# Patient Record
Sex: Male | Born: 1940
Health system: Southern US, Community
[De-identification: ages and names within clinical notes are randomized; demographics above are authoritative.]

## PROBLEM LIST (undated history)

## (undated) DIAGNOSIS — Z9289 Personal history of other medical treatment: Secondary | ICD-10-CM

## (undated) DIAGNOSIS — Z8619 Personal history of other infectious and parasitic diseases: Secondary | ICD-10-CM

## (undated) DIAGNOSIS — I4819 Other persistent atrial fibrillation: Secondary | ICD-10-CM

## (undated) DIAGNOSIS — K259 Gastric ulcer, unspecified as acute or chronic, without hemorrhage or perforation: Secondary | ICD-10-CM

## (undated) DIAGNOSIS — K269 Duodenal ulcer, unspecified as acute or chronic, without hemorrhage or perforation: Secondary | ICD-10-CM

## (undated) DIAGNOSIS — E785 Hyperlipidemia, unspecified: Secondary | ICD-10-CM

## (undated) DIAGNOSIS — Z8719 Personal history of other diseases of the digestive system: Secondary | ICD-10-CM

## (undated) DIAGNOSIS — J449 Chronic obstructive pulmonary disease, unspecified: Secondary | ICD-10-CM

## (undated) DIAGNOSIS — E559 Vitamin D deficiency, unspecified: Secondary | ICD-10-CM

## (undated) DIAGNOSIS — K635 Polyp of colon: Secondary | ICD-10-CM

## (undated) DIAGNOSIS — F17200 Nicotine dependence, unspecified, uncomplicated: Secondary | ICD-10-CM

## (undated) DIAGNOSIS — I739 Peripheral vascular disease, unspecified: Secondary | ICD-10-CM

## (undated) DIAGNOSIS — I10 Essential (primary) hypertension: Secondary | ICD-10-CM

## (undated) DIAGNOSIS — R972 Elevated prostate specific antigen [PSA]: Secondary | ICD-10-CM

## (undated) HISTORY — DX: Peripheral vascular disease, unspecified: I73.9

## (undated) HISTORY — PX: CATARACT EXTRACTION: SUR2

## (undated) HISTORY — DX: Personal history of other infectious and parasitic diseases: Z86.19

## (undated) HISTORY — DX: Essential (primary) hypertension: I10

## (undated) HISTORY — DX: Chronic obstructive pulmonary disease, unspecified: J44.9

## (undated) HISTORY — DX: Polyp of colon: K63.5

## (undated) HISTORY — DX: Elevated prostate specific antigen (PSA): R97.20

## (undated) HISTORY — DX: Vitamin D deficiency, unspecified: E55.9

## (undated) HISTORY — DX: Duodenal ulcer, unspecified as acute or chronic, without hemorrhage or perforation: K26.9

## (undated) HISTORY — DX: Other persistent atrial fibrillation: I48.19

## (undated) HISTORY — DX: Gastric ulcer, unspecified as acute or chronic, without hemorrhage or perforation: K25.9

## (undated) HISTORY — DX: Hyperlipidemia, unspecified: E78.5

## (undated) HISTORY — DX: Nicotine dependence, unspecified, uncomplicated: F17.200

## (undated) HISTORY — DX: Personal history of other medical treatment: Z92.89

---

## 1998-06-13 ENCOUNTER — Encounter: Admission: RE | Admit: 1998-06-13 | Discharge: 1998-06-13 | Payer: Self-pay | Admitting: *Deleted

## 2003-05-01 ENCOUNTER — Other Ambulatory Visit: Admission: RE | Admit: 2003-05-01 | Discharge: 2003-05-01 | Payer: Self-pay | Admitting: Family Medicine

## 2005-05-13 ENCOUNTER — Encounter: Admission: RE | Admit: 2005-05-13 | Discharge: 2005-05-13 | Payer: Self-pay | Admitting: Cardiovascular Disease

## 2005-05-18 ENCOUNTER — Ambulatory Visit (HOSPITAL_COMMUNITY): Admission: RE | Admit: 2005-05-18 | Discharge: 2005-05-19 | Payer: Self-pay | Admitting: Cardiovascular Disease

## 2005-05-19 HISTORY — PX: ILIAC VEIN ANGIOPLASTY / STENTING: SHX1788

## 2005-06-10 HISTORY — PX: TRANSTHORACIC ECHOCARDIOGRAM: SHX275

## 2007-02-28 ENCOUNTER — Encounter: Admission: RE | Admit: 2007-02-28 | Discharge: 2007-02-28 | Payer: Self-pay | Admitting: Occupational Medicine

## 2008-02-29 LAB — HM DEXA SCAN

## 2008-05-06 DIAGNOSIS — K259 Gastric ulcer, unspecified as acute or chronic, without hemorrhage or perforation: Secondary | ICD-10-CM

## 2008-05-06 DIAGNOSIS — K635 Polyp of colon: Secondary | ICD-10-CM

## 2008-05-06 DIAGNOSIS — K269 Duodenal ulcer, unspecified as acute or chronic, without hemorrhage or perforation: Secondary | ICD-10-CM

## 2008-05-06 DIAGNOSIS — Z8619 Personal history of other infectious and parasitic diseases: Secondary | ICD-10-CM

## 2008-05-06 HISTORY — DX: Gastric ulcer, unspecified as acute or chronic, without hemorrhage or perforation: K25.9

## 2008-05-06 HISTORY — DX: Personal history of other infectious and parasitic diseases: Z86.19

## 2008-05-06 HISTORY — DX: Polyp of colon: K63.5

## 2008-05-06 HISTORY — DX: Duodenal ulcer, unspecified as acute or chronic, without hemorrhage or perforation: K26.9

## 2008-05-23 LAB — HM COLONOSCOPY

## 2010-02-19 DIAGNOSIS — Z9289 Personal history of other medical treatment: Secondary | ICD-10-CM

## 2010-02-19 HISTORY — DX: Personal history of other medical treatment: Z92.89

## 2010-11-21 NOTE — Cardiovascular Report (Signed)
Antonio Wise, Antonio Wise                ACCOUNT NO.:  [id number]   MEDICAL RECORD NO.:  [id number]          PATIENT TYPE:  OIB   LOCATION:  6527                         FACILITY:  [facility]   PHYSICIAN:  Nanetta Batty, M.D.   DATE OF BIRTH:  1940/07/31   DATE OF PROCEDURE:  05/19/2005  DATE OF DISCHARGE:                              CARDIAC CATHETERIZATION   PROCEDURE PERFORMED:  Abdominal aortogram with bifemoral runoff, PT and  stent report.   HISTORY OF PRESENT ILLNESS:  Mr. Egolf is a 70 year old married white male,  father of three, referred by Dr. Christell Constant for evaluation of claudication.  His  risk factors include discontinued tobacco two years ago, hypertension,  hyperlipidemia and family history.  He had a Cardiolite stress test  performed 03/11/05 which was nonischemic.  Doppler revealed normal ABIs with  high frequency signal in the left common iliac artery.  He presents now for  angiography potential intervention.   DESCRIPTION OF PROCEDURE:  The patient was brought to the Sixth [facility] Peripheral [facility] Angiographic suite in the postabsorptive state.  He  was premedicated with p.o. Valium.  Both groins were prepped and shaved in  the usual sterile fashion.  One percent Xylocaine was used for local  anesthesia.  A 5-French sheath was inserted into the right femoral artery  using sterile Seldinger technique.  A 5-French tennis racket catheter was  used for __________ of the distal abdominal aortography with bifemoral  runoff.  Visipaque dye used for the entirety of the case.  Retrograde aortic  pressures monitored during the case.   ANGIOGRAPHIC RESULTS:  1.  Abdominal aorta:  1a. Renal arteries - normal.  1b. Infrarenal abdominal aorta - normal.  1.  Left lower extremity:  2a.  An 80% eccentric ulcerated-appearing left common iliac artery stenosis.  2b. Normal SFA and infrapopliteal vasculature with three vessel runoff.  1.  Right lower extremity:  Normal.   DESCRIPTION OF PROCEDURE:  Access was obtained in the left femoral artery  using standard Seldinger technique and a long 6-French sheath.  The patient  received 2500 units of heparin intravenously.  Put a Wholey wire across the  lesion and it was stented primarily with a 9.4 SMART.  This, however, missed  the ostium and thus an overlapping 8 x 18 Genesis __________ premount was  then deployed at the ostium of the left common iliac artery.  The entire  stented segment was postdilated with a 9.4 Powerflex at 6 atmospheres  resulting in reduction of an 80% eccentric segmental proximal left common  iliac artery stenosis to 0% residual.  The patient tolerated the procedure  well.  ACT was measured.  Both sheaths were removed.  Pressure was held on  the groin to achieve hemostasis.  The patient left the lab in stable  condition.  He will be hydrated overnight, discharged home in the morning,  treated with  aspirin and Plavix.  He will get followup lower extremity Dopplers and ABIs,  and we will see him back in the office in approximately two weeks for  followup.  Dr. Roe Coombs  Christell Constant at [facility]'s office  was notified of these results.      Nanetta Batty, M.D.  Electronically Signed     JB/MEDQ  D:  05/18/2005  T:  05/19/2005  Job:  [id number]   cc:   Sixth Floor  Peripheral Vascular Angiographic Suite   [facility]  [address]  [place], [place] [address]   Ernestina Penna, M.D.  Fax: [phone]

## 2011-10-14 DIAGNOSIS — E559 Vitamin D deficiency, unspecified: Secondary | ICD-10-CM | POA: Diagnosis not present

## 2011-10-14 DIAGNOSIS — I10 Essential (primary) hypertension: Secondary | ICD-10-CM | POA: Diagnosis not present

## 2011-10-14 DIAGNOSIS — Z79899 Other long term (current) drug therapy: Secondary | ICD-10-CM | POA: Diagnosis not present

## 2011-10-14 DIAGNOSIS — E785 Hyperlipidemia, unspecified: Secondary | ICD-10-CM | POA: Diagnosis not present

## 2012-03-04 DIAGNOSIS — L259 Unspecified contact dermatitis, unspecified cause: Secondary | ICD-10-CM | POA: Diagnosis not present

## 2012-03-04 DIAGNOSIS — R609 Edema, unspecified: Secondary | ICD-10-CM | POA: Diagnosis not present

## 2012-03-15 DIAGNOSIS — I70219 Atherosclerosis of native arteries of extremities with intermittent claudication, unspecified extremity: Secondary | ICD-10-CM | POA: Diagnosis not present

## 2012-03-15 DIAGNOSIS — I739 Peripheral vascular disease, unspecified: Secondary | ICD-10-CM | POA: Diagnosis not present

## 2012-03-21 DIAGNOSIS — I739 Peripheral vascular disease, unspecified: Secondary | ICD-10-CM | POA: Diagnosis not present

## 2012-03-21 DIAGNOSIS — I10 Essential (primary) hypertension: Secondary | ICD-10-CM | POA: Diagnosis not present

## 2012-03-21 DIAGNOSIS — I4891 Unspecified atrial fibrillation: Secondary | ICD-10-CM | POA: Diagnosis not present

## 2012-03-21 DIAGNOSIS — I70219 Atherosclerosis of native arteries of extremities with intermittent claudication, unspecified extremity: Secondary | ICD-10-CM | POA: Diagnosis not present

## 2012-04-04 DIAGNOSIS — N401 Enlarged prostate with lower urinary tract symptoms: Secondary | ICD-10-CM | POA: Diagnosis not present

## 2012-04-04 DIAGNOSIS — R972 Elevated prostate specific antigen [PSA]: Secondary | ICD-10-CM | POA: Diagnosis not present

## 2012-04-06 DIAGNOSIS — I10 Essential (primary) hypertension: Secondary | ICD-10-CM | POA: Diagnosis not present

## 2012-04-11 DIAGNOSIS — I10 Essential (primary) hypertension: Secondary | ICD-10-CM | POA: Diagnosis not present

## 2012-04-11 DIAGNOSIS — E559 Vitamin D deficiency, unspecified: Secondary | ICD-10-CM | POA: Diagnosis not present

## 2012-04-11 DIAGNOSIS — Z23 Encounter for immunization: Secondary | ICD-10-CM | POA: Diagnosis not present

## 2012-04-11 DIAGNOSIS — E785 Hyperlipidemia, unspecified: Secondary | ICD-10-CM | POA: Diagnosis not present

## 2012-05-04 DIAGNOSIS — H25099 Other age-related incipient cataract, unspecified eye: Secondary | ICD-10-CM | POA: Diagnosis not present

## 2012-11-24 ENCOUNTER — Telehealth: Payer: Self-pay | Admitting: Physician Assistant

## 2012-11-24 MED ORDER — HYDROCHLOROTHIAZIDE 25 MG PO TABS: 25.0000 mg | ORAL_TABLET | Freq: Every day | ORAL | Status: DC

## 2012-11-24 MED ORDER — BENAZEPRIL HCL 40 MG PO TABS: 40.0000 mg | ORAL_TABLET | Freq: Every day | ORAL | Status: DC

## 2012-11-24 NOTE — Telephone Encounter (Signed)
Med rf °

## 2012-11-24 NOTE — Telephone Encounter (Signed)
Med rf, pt aware needs to make appt before anymore meds are refilled

## 2012-12-02 ENCOUNTER — Telehealth: Payer: Self-pay | Admitting: Physician Assistant

## 2012-12-02 MED ORDER — ATORVASTATIN CALCIUM 80 MG PO TABS: 80.0000 mg | ORAL_TABLET | Freq: Every day | ORAL | Status: DC

## 2012-12-02 MED ORDER — OLMESARTAN MEDOXOMIL 20 MG PO TABS: 20.0000 mg | ORAL_TABLET | Freq: Every day | ORAL | Status: DC

## 2012-12-02 NOTE — Telephone Encounter (Signed)
Medication refilled per protocol. 

## 2012-12-06 ENCOUNTER — Telehealth: Payer: Self-pay | Admitting: Physician Assistant

## 2012-12-06 NOTE — Telephone Encounter (Signed)
Refills will be extended when keeps OV tomorrow.

## 2012-12-07 ENCOUNTER — Encounter: Payer: Self-pay | Admitting: Physician Assistant

## 2012-12-07 ENCOUNTER — Ambulatory Visit (INDEPENDENT_AMBULATORY_CARE_PROVIDER_SITE_OTHER): Payer: Medicare Other | Admitting: Physician Assistant

## 2012-12-07 VITALS — BP 142/80 | HR 68 | Temp 97.0°F | Resp 18 | Ht 71.5 in | Wt 203.0 lb

## 2012-12-07 DIAGNOSIS — I10 Essential (primary) hypertension: Secondary | ICD-10-CM | POA: Diagnosis not present

## 2012-12-07 DIAGNOSIS — R972 Elevated prostate specific antigen [PSA]: Secondary | ICD-10-CM

## 2012-12-07 DIAGNOSIS — E559 Vitamin D deficiency, unspecified: Secondary | ICD-10-CM

## 2012-12-07 DIAGNOSIS — I4891 Unspecified atrial fibrillation: Secondary | ICD-10-CM

## 2012-12-07 DIAGNOSIS — F17200 Nicotine dependence, unspecified, uncomplicated: Secondary | ICD-10-CM

## 2012-12-07 DIAGNOSIS — E785 Hyperlipidemia, unspecified: Secondary | ICD-10-CM

## 2012-12-07 DIAGNOSIS — I739 Peripheral vascular disease, unspecified: Secondary | ICD-10-CM | POA: Insufficient documentation

## 2012-12-07 DIAGNOSIS — J441 Chronic obstructive pulmonary disease with (acute) exacerbation: Secondary | ICD-10-CM | POA: Insufficient documentation

## 2012-12-07 DIAGNOSIS — I480 Paroxysmal atrial fibrillation: Secondary | ICD-10-CM | POA: Insufficient documentation

## 2012-12-07 LAB — COMPLETE METABOLIC PANEL WITH GFR
ALT: 13 U/L (ref 0–53)
AST: 13 U/L (ref 0–37)
Albumin: 4.5 g/dL (ref 3.5–5.2)
Alkaline Phosphatase: 57 U/L (ref 39–117)
BUN: 19 mg/dL (ref 6–23)
CO2: 28 mEq/L (ref 19–32)
Calcium: 9.2 mg/dL (ref 8.4–10.5)
Chloride: 103 mEq/L (ref 96–112)
Creat: 1.37 mg/dL — ABNORMAL HIGH (ref 0.50–1.35)
GFR, Est African American: 60 mL/min
GFR, Est Non African American: 52 mL/min — ABNORMAL LOW
Glucose, Bld: 110 mg/dL — ABNORMAL HIGH (ref 70–99)
Potassium: 4.8 mEq/L (ref 3.5–5.3)
Sodium: 144 mEq/L (ref 135–145)
Total Bilirubin: 0.7 mg/dL (ref 0.3–1.2)
Total Protein: 6.5 g/dL (ref 6.0–8.3)

## 2012-12-07 LAB — LIPID PANEL
Cholesterol: 137 mg/dL (ref 0–200)
HDL: 34 mg/dL — ABNORMAL LOW (ref >39)
Triglycerides: 124 mg/dL (ref <150)
VLDL: 25 mg/dL (ref 0–40)

## 2012-12-07 MED ORDER — OLMESARTAN MEDOXOMIL 20 MG PO TABS: 20.0000 mg | ORAL_TABLET | Freq: Every day | ORAL | Status: DC

## 2012-12-07 MED ORDER — HYDROCHLOROTHIAZIDE 25 MG PO TABS: 25.0000 mg | ORAL_TABLET | Freq: Every day | ORAL | Status: DC

## 2012-12-07 MED ORDER — CLONIDINE HCL 0.1 MG PO TABS: 0.1000 mg | ORAL_TABLET | Freq: Two times a day (BID) | ORAL | Status: DC

## 2012-12-07 MED ORDER — PROPAFENONE HCL 225 MG PO TABS: 225.0000 mg | ORAL_TABLET | Freq: Two times a day (BID) | ORAL | Status: DC

## 2012-12-07 MED ORDER — BENAZEPRIL HCL 40 MG PO TABS: 40.0000 mg | ORAL_TABLET | Freq: Every day | ORAL | Status: DC

## 2012-12-07 MED ORDER — ATORVASTATIN CALCIUM 80 MG PO TABS: 80.0000 mg | ORAL_TABLET | Freq: Every day | ORAL | Status: DC

## 2012-12-07 NOTE — Progress Notes (Signed)
Patient ID: Antonio Wise MRN: [id number], DOB: Dec 01, 1940, 72 y.o. Date of Encounter: @DATE @  Chief Complaint:  Chief Complaint  Patient presents with  . 6 mth check up    is fasting    HPI: 72 y.o. year old male  presents for routine f/u OV.   1-HTN: Taking meds as listed. No adv effects. 2-HLD: Taking Lipitor 80mg . No myalgias or other adv effects.  3-Vit D Def: Is taking 1200 IU as directed. 4-Smoker: Says he still has not filled Rx for Chantix. Still smoking about 1 ppd.  He has no complaints. Says he has been feeling good. No chest pressure, heaviness, tightness, SOB-even with exertion.  No LE Claudicaton symtoms. No paitations. LOV with Dr. Allyson Sabal 03/2012.   Past Medical History  Diagnosis Date  . Hypertension   . Hyperlipidemia   . Vitamin D deficiency   . Smoker unmotivated to quit   . Peripheral vascular disease   . Paroxysmal atrial fibrillation   . Elevated PSA      Home Meds: See attached medication section for current medication list. Any medications entered into computer today will not appear on this note's list. The medications listed below were entered prior to today. Current Outpatient Prescriptions on File Prior to Visit  Medication Sig Dispense Refill  . aspirin 81 MG tablet Take 81 mg by mouth daily.       No current facility-administered medications on file prior to visit.    Allergies:  Allergies  Allergen Reactions  . Codeine     History   Social History  . Marital Status: Married    Spouse Name: N/A    Number of Children: N/A  . Years of Education: N/A   Occupational History  . Not on file.   Social History Main Topics  . Smoking status: Current Every Day Smoker -- 0.50 packs/day    Types: Cigarettes  . Smokeless tobacco: Never Used  . Alcohol Use: No  . Drug Use: No  . Sexually Active: Not on file   Other Topics Concern  . Not on file   Social History Narrative  . No narrative on file    No family history on file.    Review of Systems:  See HPI for pertinent ROS. All other ROS negative.    Physical Exam: Blood pressure 142/80, pulse 68, temperature 97 F (36.1 C), temperature source Oral, resp. rate 18, height 5' 11.5" (1.816 m), weight 203 lb (92.08 kg)., Body mass index is 27.92 kg/(m^2). BP By Me: 130/70  General:WNWD WM.  Appears in no acute distress. Neck: Supple. No thyromegaly. No lymphadenopathy. No Carotid Bruits. Lungs: Clear bilaterally to auscultation without wheezes, rales, or rhonchi. Breathing is unlabored. Heart: RRR with S1 S2. No murmurs, rubs, or gallops. Abdomen: Soft, non-tender, non-distended with normoactive bowel sounds. No hepatomegaly. No rebound/guarding. No obvious abdominal masses. Musculoskeletal:  Strength and tone normal for age. Extremities/Skin: Warm and dry. No clubbing or cyanosis. No edema. No rashes or suspicious lesions. Neuro: Alert and oriented X 3. Moves all extremities spontaneously. Gait is normal. CNII-XII grossly in tact. Psych:  Responds to questions appropriately with a normal affect.     ASSESSMENT AND PLAN:  72 y.o. year old male with  1. Hyperlipidemia - COMPLETE METABOLIC PANEL WITH GFR - Lipid panel - atorvastatin (LIPITOR) 80 MG tablet; Take 1 tablet (80 mg total) by mouth daily.  Dispense: 90 tablet; Refill: 1  2. Hypertension I got BP 130/70, which is at goal.  Cont current meds. Check lab to monitor.  - COMPLETE METABOLIC PANEL WITH GFR - benazepril (LOTENSIN) 40 MG tablet; Take 1 tablet (40 mg total) by mouth daily.  Dispense: 90 tablet; Refill: 1 - cloNIDine (CATAPRES) 0.1 MG tablet; Take 1 tablet (0.1 mg total) by mouth 2 (two) times daily.  Dispense: 180 tablet; Refill: 1 - hydrochlorothiazide (HYDRODIURIL) 25 MG tablet; Take 1 tablet (25 mg total) by mouth daily.  Dispense: 90 tablet; Refill: 1 - olmesartan (BENICAR) 20 MG tablet; Take 1 tablet (20 mg total) by mouth daily.  Dispense: 90 tablet; Refill: 1  3. Smoker unmotivated to  quit See HPI. I gave Rx Chantix at prior OV. He never filled Rx. Has cut back some on his own. Jus tisnt motivated to quit.   4. Elevated PSA H/o negative biopsy. Sees Urology annually.   5. Peripheral vascular disease Per Dr. Allyson Sabal  6. Paroxysmal atrial fibrillation Per Dr. Allyson Sabal - propafenone (RYTHMOL) 225 MG tablet; Take 1 tablet (225 mg total) by mouth 2 (two) times daily.  Dispense: 180 tablet; Refill: 1  7. Vitamin D deficiency On 1200IU QD - Vitamin D 25 hydroxy  8. H/o Negative Cardiolite 9. DEXA 02/2008-Nml *10. Colonoscopy 11/09-Polyp-Repeat 5 years--DUE 05/2013 * 11. Pneumonia Vaccine: 04/2011 12. Tetanus Vaccine: 04/2003: DUE 04/2013*  Signed, [address] [place], [place], [facility] 12/07/2012 2:14 PM

## 2012-12-08 LAB — VITAMIN D 25 HYDROXY (VIT D DEFICIENCY, FRACTURES): Vit D, 25-Hydroxy: 46 ng/mL (ref 30–89)

## 2013-03-22 ENCOUNTER — Encounter: Payer: Self-pay | Admitting: *Deleted

## 2013-03-28 ENCOUNTER — Encounter: Payer: Self-pay | Admitting: Cardiovascular Disease

## 2013-03-29 ENCOUNTER — Ambulatory Visit (INDEPENDENT_AMBULATORY_CARE_PROVIDER_SITE_OTHER): Payer: Medicare Other | Admitting: Cardiovascular Disease

## 2013-03-29 ENCOUNTER — Encounter: Payer: Self-pay | Admitting: Cardiovascular Disease

## 2013-03-29 VITALS — BP 160/80 | HR 67 | Ht 73.0 in | Wt 207.0 lb

## 2013-03-29 DIAGNOSIS — E785 Hyperlipidemia, unspecified: Secondary | ICD-10-CM

## 2013-03-29 DIAGNOSIS — F17200 Nicotine dependence, unspecified, uncomplicated: Secondary | ICD-10-CM

## 2013-03-29 DIAGNOSIS — I4891 Unspecified atrial fibrillation: Secondary | ICD-10-CM

## 2013-03-29 DIAGNOSIS — I739 Peripheral vascular disease, unspecified: Secondary | ICD-10-CM

## 2013-03-29 DIAGNOSIS — I480 Paroxysmal atrial fibrillation: Secondary | ICD-10-CM

## 2013-03-29 DIAGNOSIS — I10 Essential (primary) hypertension: Secondary | ICD-10-CM

## 2013-03-29 NOTE — Progress Notes (Signed)
03/29/2013 Antonio Wise   03/19/1941  [id number]  Primary Physician Frazier Richards, PA-C Primary Cardiologist: Runell Gess MD Roseanne Reno   HPI:  The patient returns today for followup. I last saw him a year ago. He is a 72 year old, mildly overweight, married Caucasian male, father of three, grandfather to three grandchildren with a history of peripheral vascular occlusive disease status post left common iliac artery PTA and stenting by myself August 19, 2004, for claudication. His symptoms resolved after that, and his Dopplers normalized. His other problems include discontinued tobacco abuse, only smoking two cigarettes a week, which is excellent for him. He has treated hypertension, dyslipidemia, and paroxysmal atrial fibrillation maintaining sinus rhythm on Rythmol. He has not had any A fib to his knowledge since I last saw him.   His most recent lipid profile performed 12/07/12 revealed a total cholesterol 137, LDL of 78 and HDL of 34. His most recent blood sugar Dopplers performed a year ago revealed ABIs of 1 bilaterally with patent left iliac stent. He denies chest pain, shortness of breath or claudication.  Current Outpatient Prescriptions  Medication Sig Dispense Refill  . aspirin 81 MG tablet Take 81 mg by mouth daily.      Marland Kitchen atorvastatin (LIPITOR) 80 MG tablet Take 1 tablet (80 mg total) by mouth daily.  90 tablet  1  . benazepril (LOTENSIN) 40 MG tablet Take 1 tablet (40 mg total) by mouth daily.  90 tablet  1  . cloNIDine (CATAPRES) 0.1 MG tablet Take 1 tablet (0.1 mg total) by mouth 2 (two) times daily.  180 tablet  1  . hydrochlorothiazide (HYDRODIURIL) 25 MG tablet Take 1 tablet (25 mg total) by mouth daily.  90 tablet  1  . Multiple Minerals-Vitamins (CALCIUM & VIT D3 BONE HEALTH PO) Take 1 tablet by mouth daily.      Marland Kitchen olmesartan (BENICAR) 20 MG tablet Take 1 tablet (20 mg total) by mouth daily.  90 tablet  1  . propafenone (RYTHMOL) 225 MG tablet Take 1  tablet (225 mg total) by mouth 2 (two) times daily.  180 tablet  1   No current facility-administered medications for this visit.    Allergies  Allergen Reactions  . Codeine     History   Social History  . Marital Status: Married    Spouse Name: N/A    Number of Children: 3  . Years of Education: N/A   Occupational History  .  [employer]   Social History Main Topics  . Smoking status: Current Every Day Smoker -- 0.50 packs/day    Types: Cigarettes  . Smokeless tobacco: Never Used  . Alcohol Use: No  . Drug Use: No  . Sexual Activity: Not on file   Other Topics Concern  . Not on file   Social History Narrative  . No narrative on file     Review of Systems: General: negative for chills, fever, night sweats or weight changes.  Cardiovascular: negative for chest pain, dyspnea on exertion, edema, orthopnea, palpitations, paroxysmal nocturnal dyspnea or shortness of breath Dermatological: negative for rash Respiratory: negative for cough or wheezing Urologic: negative for hematuria Abdominal: negative for nausea, vomiting, diarrhea, bright red blood per rectum, melena, or hematemesis Neurologic: negative for visual changes, syncope, or dizziness All other systems reviewed and are otherwise negative except as noted above.    Blood pressure 160/80, pulse 67, height 6\' 1"  (1.854 m), weight 207 lb (93.895 kg).  General appearance: alert and  no distress Neck: no adenopathy, no carotid bruit, no JVD, supple, symmetrical, trachea midline and thyroid not enlarged, symmetric, no tenderness/mass/nodules Lungs: clear to auscultation bilaterally Heart: regular rate and rhythm, S1, S2 normal, no murmur, click, rub or gallop Extremities: extremities normal, atraumatic, no cyanosis or edema  EKG normal sinus rhythm at 68 with septal Q waves  ASSESSMENT AND PLAN:   Hypertension Poorly controlled on current medications but he says his blood pressure is better at  home  Hyperlipidemia On statin therapy. Recent lipid profile performed 12/07/12 revealed a total cholesterol of 137, LDL 78 HDL of 34  Smoker unmotivated to quit Uninterested in stopping  Peripheral vascular disease Status post restenting of his left common iliac artery 11/14/6 with recent Dopplers performed 03/15/12 revealing has been to be widely patent. He denies claudication. We will recheck this on an annual basis.      Runell Gess MD FACP,FACC,FAHA, [facility] 03/29/2013 11:04 AM

## 2013-03-29 NOTE — Assessment & Plan Note (Signed)
On statin therapy. Recent lipid profile performed 12/07/12 revealed a total cholesterol of 137, LDL 78 HDL of 34

## 2013-03-29 NOTE — Assessment & Plan Note (Signed)
Status post restenting of his left common iliac artery 11/14/6 with recent Dopplers performed 03/15/12 revealing has been to be widely patent. He denies claudication. We will recheck this on an annual basis.

## 2013-03-29 NOTE — Assessment & Plan Note (Signed)
Uninterested in stopping

## 2013-03-29 NOTE — Assessment & Plan Note (Signed)
Poorly controlled on current medications but he says his blood pressure is better at home

## 2013-03-29 NOTE — Patient Instructions (Addendum)
Your physician wants you to follow-up in: 1 year with Dr Allyson Sabal.  You will receive a reminder letter in the mail two months in advance. If you don't receive a letter, please call our office to schedule the follow-up appointment.  Dr Allyson Sabal has ordered lower extremity arterial dopplers

## 2013-04-06 ENCOUNTER — Ambulatory Visit (INDEPENDENT_AMBULATORY_CARE_PROVIDER_SITE_OTHER): Payer: Medicare Other | Admitting: Physician Assistant

## 2013-04-06 ENCOUNTER — Encounter: Payer: Self-pay | Admitting: Physician Assistant

## 2013-04-06 VITALS — BP 160/90 | HR 80 | Temp 98.3°F | Resp 20 | Wt 203.0 lb

## 2013-04-06 DIAGNOSIS — Z23 Encounter for immunization: Secondary | ICD-10-CM

## 2013-04-06 DIAGNOSIS — J449 Chronic obstructive pulmonary disease, unspecified: Secondary | ICD-10-CM | POA: Diagnosis not present

## 2013-04-06 DIAGNOSIS — F17200 Nicotine dependence, unspecified, uncomplicated: Secondary | ICD-10-CM

## 2013-04-06 MED ORDER — VARENICLINE TARTRATE 0.5 MG PO TABS: 0.5000 mg | ORAL_TABLET | Freq: Two times a day (BID) | ORAL | Status: DC

## 2013-04-06 MED ORDER — BUDESONIDE-FORMOTEROL FUMARATE 160-4.5 MCG/ACT IN AERO: 2.0000 | INHALATION_SPRAY | Freq: Two times a day (BID) | RESPIRATORY_TRACT | Status: DC

## 2013-04-06 MED ORDER — VARENICLINE TARTRATE 1 MG PO TABS: 1.0000 mg | ORAL_TABLET | Freq: Two times a day (BID) | ORAL | Status: DC

## 2013-04-07 NOTE — Progress Notes (Signed)
Patient ID: DAVIED NOCITO MRN: [id number], DOB: [birth year], 72 y.o. Date of Encounter: 04/07/2013, 10:28 AM    Chief Complaint:  Chief Complaint  Patient presents with  . 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 Was told to f/u with PCP for treatment. No other complaints.     Home Meds: See attached medication section for any medications that were entered at today's visit. The computer does not put those onto this list.The following list is a list of meds entered prior to today's visit.   Current Outpatient Prescriptions on File Prior to Visit  Medication Sig Dispense Refill  . aspirin 81 MG tablet Take 81 mg by mouth daily.      Marland Kitchen atorvastatin (LIPITOR) 80 MG tablet Take 1 tablet (80 mg total) by mouth daily.  90 tablet  1  . benazepril (LOTENSIN) 40 MG tablet Take 1 tablet (40 mg total) by mouth daily.  90 tablet  1  . cloNIDine (CATAPRES) 0.1 MG tablet Take 1 tablet (0.1 mg total) by mouth 2 (two) times daily.  180 tablet  1  . hydrochlorothiazide (HYDRODIURIL) 25 MG tablet Take 1 tablet (25 mg total) by mouth daily.  90 tablet  1  . Multiple Minerals-Vitamins (CALCIUM & VIT D3 BONE HEALTH PO) Take 1 tablet by mouth daily.      Marland Kitchen olmesartan (BENICAR) 20 MG tablet Take 1 tablet (20 mg total) by mouth daily.  90 tablet  1  . propafenone (RYTHMOL) 225 MG tablet Take 1 tablet (225 mg total) by mouth 2 (two) times daily.  180 tablet  1   No current facility-administered medications on file prior to visit.    Allergies:  Allergies  Allergen Reactions  . Codeine       Review of Systems: See HPI for pertinent ROS. All other ROS negative.    Physical Exam: Blood pressure 160/90, pulse 80, temperature 98.3 F (36.8 C), temperature source Oral, resp. rate 20, weight 203 lb (92.08 kg), SpO2 93.00%., Body mass index is 26.79 kg/(m^2). General:  WNWD WM.  Appears in no acute distress. Neck: Supple. No thyromegaly. No lymphadenopathy. Lungs: Distant, decreased breath sounds throughout but no wheezes. Heart: Regular rhythm. No murmurs, rubs, or gallops Msk:  Strength and tone normal for age. Extremities/Skin: Warm and dry.  Neuro: Alert and oriented X 3. Moves all extremities spontaneously. Gait is normal. CNII-XII grossly in tact. Psych:  Responds to questions appropriately with a normal affect.     ASSESSMENT AND PLAN:  72 y.o. year old male with  1. COPD (chronic obstructive pulmonary disease) - varenicline (CHANTIX) 0.5 MG tablet; Take 1 tablet (0.5 mg total) by mouth 2 (two) times daily.  Dispense: 60 tablet; Refill: 0 - varenicline (CHANTIX CONTINUING MONTH PAK) 1 MG tablet; Take 1 tablet (1 mg total) by mouth 2 (two) times daily.  Dispense: 60 tablet; Refill: 3 - budesonide-formoterol (SYMBICORT) 160-4.5 MCG/ACT inhaler; Inhale 2 puffs into the lungs 2 (two) times daily.  Dispense: 1 Inhaler; Refill: 3  2. Smoker - varenicline (CHANTIX) 0.5 MG tablet; Take 1 tablet (0.5 mg total) by mouth 2 (two) times daily.  Dispense: 60 tablet; Refill: 0 - varenicline (CHANTIX CONTINUING MONTH PAK) 1 MG tablet; Take 1 tablet (1 mg total) by mouth 2 (two) times daily.  Dispense: 60 tablet; Refill: 3 -  budesonide-formoterol (SYMBICORT) 160-4.5 MCG/ACT inhaler; Inhale 2 puffs into the lungs 2 (two) times daily.  Dispense: 1 Inhaler; Refill: 3    Pt reports that since this test, he has decided he is going to quit smoking. Has already bought some patches. However, says he used patches in past withoout success. Discussed Chantix and he is agreeabl to use this. Cationed regarding possible adverse effects. If develops cahnge in mood/behavior, stop med and call me immediately.  Will also start Symbicort.   3. Need for prophylactic vaccination and inoculation against influenza - Flu Vaccine QUAD 36+ mos PF IM (Fluarix)   Signed, [address]  [place], [place], [facility] 04/07/2013 10:28 AM

## 2013-04-12 ENCOUNTER — Telehealth (HOSPITAL_COMMUNITY): Payer: Self-pay | Admitting: *Deleted

## 2013-04-12 ENCOUNTER — Ambulatory Visit (HOSPITAL_COMMUNITY)
Admission: RE | Admit: 2013-04-12 | Discharge: 2013-04-12 | Disposition: A | Discharge status: Home / Self Care | Payer: Medicare Other | Source: Ambulatory Visit | Attending: Cardiovascular Disease | Admitting: Cardiovascular Disease

## 2013-04-12 DIAGNOSIS — I70219 Atherosclerosis of native arteries of extremities with intermittent claudication, unspecified extremity: Secondary | ICD-10-CM

## 2013-04-12 DIAGNOSIS — I739 Peripheral vascular disease, unspecified: Secondary | ICD-10-CM | POA: Diagnosis not present

## 2013-04-12 NOTE — Progress Notes (Signed)
Lower Extremity Arterial Duplex Completed. °Brianna L Mazza,RVT °

## 2013-04-13 DIAGNOSIS — N401 Enlarged prostate with lower urinary tract symptoms: Secondary | ICD-10-CM | POA: Diagnosis not present

## 2013-04-13 DIAGNOSIS — R972 Elevated prostate specific antigen [PSA]: Secondary | ICD-10-CM | POA: Diagnosis not present

## 2013-04-14 ENCOUNTER — Encounter: Payer: Self-pay | Admitting: *Deleted

## 2013-06-14 ENCOUNTER — Ambulatory Visit (INDEPENDENT_AMBULATORY_CARE_PROVIDER_SITE_OTHER): Payer: Medicare Other | Admitting: Physician Assistant

## 2013-06-14 ENCOUNTER — Encounter: Payer: Self-pay | Admitting: Physician Assistant

## 2013-06-14 VITALS — BP 112/68 | HR 72 | Temp 96.8°F | Resp 20 | Wt 209.0 lb

## 2013-06-14 DIAGNOSIS — R972 Elevated prostate specific antigen [PSA]: Secondary | ICD-10-CM

## 2013-06-14 DIAGNOSIS — F17200 Nicotine dependence, unspecified, uncomplicated: Secondary | ICD-10-CM

## 2013-06-14 DIAGNOSIS — R7309 Other abnormal glucose: Secondary | ICD-10-CM

## 2013-06-14 DIAGNOSIS — E559 Vitamin D deficiency, unspecified: Secondary | ICD-10-CM

## 2013-06-14 DIAGNOSIS — Z23 Encounter for immunization: Secondary | ICD-10-CM

## 2013-06-14 DIAGNOSIS — J449 Chronic obstructive pulmonary disease, unspecified: Secondary | ICD-10-CM

## 2013-06-14 DIAGNOSIS — I10 Essential (primary) hypertension: Secondary | ICD-10-CM | POA: Diagnosis not present

## 2013-06-14 DIAGNOSIS — E785 Hyperlipidemia, unspecified: Secondary | ICD-10-CM

## 2013-06-14 DIAGNOSIS — I739 Peripheral vascular disease, unspecified: Secondary | ICD-10-CM

## 2013-06-14 DIAGNOSIS — I480 Paroxysmal atrial fibrillation: Secondary | ICD-10-CM

## 2013-06-14 DIAGNOSIS — I4891 Unspecified atrial fibrillation: Secondary | ICD-10-CM

## 2013-06-14 DIAGNOSIS — R739 Hyperglycemia, unspecified: Secondary | ICD-10-CM | POA: Insufficient documentation

## 2013-06-14 DIAGNOSIS — Z1211 Encounter for screening for malignant neoplasm of colon: Secondary | ICD-10-CM | POA: Insufficient documentation

## 2013-06-14 LAB — LIPID PANEL
Cholesterol: 141 mg/dL (ref 0–200)
LDL Cholesterol: 81 mg/dL (ref 0–99)
VLDL: 24 mg/dL (ref 0–40)

## 2013-06-14 LAB — COMPLETE METABOLIC PANEL WITH GFR
AST: 12 U/L (ref 0–37)
Albumin: 4.4 g/dL (ref 3.5–5.2)
Alkaline Phosphatase: 64 U/L (ref 39–117)
BUN: 23 mg/dL (ref 6–23)
CO2: 24 mEq/L (ref 19–32)
Calcium: 9.3 mg/dL (ref 8.4–10.5)
Chloride: 102 mEq/L (ref 96–112)
Creat: 1.35 mg/dL (ref 0.50–1.35)
GFR, Est Non African American: 53 mL/min — ABNORMAL LOW
Glucose, Bld: 101 mg/dL — ABNORMAL HIGH (ref 70–99)
Potassium: 4.2 mEq/L (ref 3.5–5.3)
Sodium: 139 mEq/L (ref 135–145)
Total Bilirubin: 0.8 mg/dL (ref 0.3–1.2)
Total Protein: 6.6 g/dL (ref 6.0–8.3)

## 2013-06-14 LAB — HEMOGLOBIN A1C
Hgb A1c MFr Bld: 5.6 % (ref <5.7)
Mean Plasma Glucose: 114 mg/dL (ref <117)

## 2013-06-14 MED ORDER — AMLODIPINE BESYLATE 5 MG PO TABS: 5.0000 mg | ORAL_TABLET | Freq: Every day | ORAL | Status: DC

## 2013-06-14 NOTE — Progress Notes (Signed)
Patient ID: Antonio Wise MRN: [id number], DOB: 13-Jul-1940, 72 y.o. Date of Encounter: @DATE @  Chief Complaint:  Chief Complaint  Patient presents with  . 6 mth check up    is fasting    HPI: 72 y.o. year old white male  presents for routine followup.  His last visit with me was on 04/06/13 for evaluation of COPD. He had gone in for DOT physical and they had done PFTs which revealed severe COPD. He was told to followup with PCP for treatment. At that visit I prescribed Chantix and also prescribe Symbicort. At that visit he reported that since having that test he decided he was going to quit smoking. He already bought some patches. However he reported he used the patches in the past without success. He was agreeable to use the Chantix. Today he says that after the visit with me on 04/06/13 later that day he went back to the DOT physician. At that visit he told DOT examiner that he had been prescribed Chantix for smoking cessation. The examiner told him that he would not sign for him to drive under the DOT provisions while taking Chantix. Therefore the patient  said he would not use Chantix and instead we used patches in E. cigarettes. The patient today reports he is still using the patches and the E- cigarettes. As well he is taking the Symbicort as directed. He has smoked no regular cigarettes for over one month now. Says that he can tell symptomatic improvement with improved breathing.  Says that at that last visit with the DOT examiner he gave him a card to cover him driving for 3 months. At the end of this 3 months patient has to see him back for followup. Prior to that appointment he was to see me to have a followup piece of paper completed. He has a followup appointment with the DOT examiner O1/2/15.   Patient also reports that he is in a "doughnut hole "because of his Benicar. Is asking whether there is a cheaper option for this.  He is taking blood pressure medications as  directed. He is taking cholesterol medication as directed. No myalgias or other adverse effects.   Past Medical History  Diagnosis Date  . Hypertension   . Hyperlipidemia   . Vitamin D deficiency   . Smoker unmotivated to quit   . Peripheral vascular disease   . Paroxysmal atrial fibrillation   . Elevated PSA   . COPD (chronic obstructive pulmonary disease)   . H/O Helicobacter infection 05/06/2008    EGD  . Gastric ulcer 05/06/2008    EGD  . Duodenal ulcer 05/06/2008    EGD  . Colon polyp 05/06/2008    repeat 5 years  . History of nuclear stress test 02/19/2010    bruce protocol; mild-mod perfusion defect in spetal region consistent with attenuation artifact; no significant ischemia demonstrated; dysrhythmia during exercise     Home Meds: See attached medication section for current medication list. Any medications entered into computer today will not appear on this note's list. The medications listed below were entered prior to today. Current Outpatient Prescriptions on File Prior to Visit  Medication Sig Dispense Refill  . aspirin 81 MG tablet Take 81 mg by mouth daily.      . benazepril (LOTENSIN) 40 MG tablet Take 1 tablet (40 mg total) by mouth daily.  90 tablet  1  . budesonide-formoterol (SYMBICORT) 160-4.5 MCG/ACT inhaler Inhale 2 puffs into the lungs 2 (two)  times daily.  1 Inhaler  3  . cloNIDine (CATAPRES) 0.1 MG tablet Take 1 tablet (0.1 mg total) by mouth 2 (two) times daily.  180 tablet  1  . hydrochlorothiazide (HYDRODIURIL) 25 MG tablet Take 1 tablet (25 mg total) by mouth daily.  90 tablet  1  . Multiple Minerals-Vitamins (CALCIUM & VIT D3 BONE HEALTH PO) Take 1 tablet by mouth daily.      . propafenone (RYTHMOL) 225 MG tablet Take 1 tablet (225 mg total) by mouth 2 (two) times daily.  180 tablet  1   No current facility-administered medications on file prior to visit.    Allergies:  Allergies  Allergen Reactions  . Codeine     History   Social History  .  Marital Status: Married    Spouse Name: N/A    Number of Children: 3  . Years of Education: N/A   Occupational History  .  [employer]   Social History Main Topics  . Smoking status: Former Smoker -- 0.50 packs/day    Types: Cigarettes    Quit date: 05/17/2013  . Smokeless tobacco: Never Used  . Alcohol Use: No  . Drug Use: No  . Sexual Activity: Not on file   Other Topics Concern  . Not on file   Social History Narrative  . No narrative on file    Family History  Problem Relation Age of Onset  . Heart disease Mother   . COPD Father   . Cancer Sister 68    Breast Cancer  . Diabetes Brother   . Cancer Father      Review of Systems:  See HPI for pertinent ROS. All other ROS negative.    Physical Exam: Blood pressure 112/68, pulse 72, temperature 96.8 F (36 C), temperature source Oral, resp. rate 20, weight 209 lb (94.802 kg)., Body mass index is 27.58 kg/(m^2). General: WNWD WM. Appears in no acute distress. Neck: Supple. No thyromegaly. No lymphadenopathy. No carotid bruits. Lungs: Breath sounds are decreased and distant. However there are no active wheezes. Heart: RRR with S1 S2. No murmurs, rubs, or gallops. Abdomen: Soft, non-tender, non-distended with normoactive bowel sounds. No hepatomegaly. No rebound/guarding. No obvious abdominal masses. Musculoskeletal:  Strength and tone normal for age. Extremities/Skin: Warm and dry. No clubbing or cyanosis. No edema. No rashes or suspicious lesions. Neuro: Alert and oriented X 3. Moves all extremities spontaneously. Gait is normal. CNII-XII grossly in tact. Psych:  Responds to questions appropriately with a normal affect.     ASSESSMENT AND PLAN:  72 y.o. year old male with  1. Hypertension In the past he was on Cardizem but he is not currently on any calcium channel blocker. Therefore we can stop the Benicar and replace this with Norvasc 5 mg. Make this change secondary to finances. See history of present  illness. cont all other blood pressure medications the same. - COMPLETE METABOLIC PANEL WITH GFR - amLODipine (NORVASC) 5 MG tablet; Take 1 tablet (5 mg total) by mouth daily.  Dispense: 90 tablet; Refill: 3  2. Hyperlipidemia - COMPLETE METABOLIC PANEL WITH GFR - Lipid panel  3. Vitamin D deficiency  - Vit D  25 hydroxy (rtn osteoporosis monitoring)  4. Smoker -- now on patches in E. cigarettes. Not smoking any regular cigarettes now.  5. Peripheral vascular disease Per Dr. Allyson Sabal  6. Paroxysmal atrial fibrillation Per dr berry  7. Elevated PSA History of negative biopsy. Sees urology annually.  8. COPD (chronic obstructive pulmonary  disease) See history of present illness. Continue Symbicort. Stay off of cigarettes. Wean off of the patches in E. cigarettes as able. Followup with the DOT examiner regarding the DOT guidelines.  9. Screening for colorectal cancer Last colonoscopy was November 2009. It showed polyp. Repeat 5 years. This was due November 2014. Asian says that he has not gone for followup. I wrote down Dr. Cecille Rubin phone number for him to call. He is to call to schedule followup.  10. History of negative Cardiolite.  11. DEXA scan August 2009 normal  12. Immunizations: With the vaccine was given here 04/07/13 Pneumovax was given here October 2012. Given the new guidelines we'll add Prevnar 13 now. Tetanus vaccine: 04/2003. This was due October 2014. However we are out of tetanus vaccine today. We will need to update his tetanus vaccine at his next office visit here.  13. Hyperglycemia:  I just realized on his last lab his glucose was slightly elevated and I documented that I would check an A1c at his next checked. We'll A1cNow.   Signed, [address] [place], [place], [facility] 06/14/2013 10:05 AM

## 2013-06-15 LAB — VITAMIN D 25 HYDROXY (VIT D DEFICIENCY, FRACTURES): Vit D, 25-Hydroxy: 35 ng/mL (ref 30–89)

## 2013-06-29 ENCOUNTER — Other Ambulatory Visit: Payer: Self-pay | Admitting: Physician Assistant

## 2013-06-30 ENCOUNTER — Other Ambulatory Visit: Payer: Self-pay | Admitting: Physician Assistant

## 2013-06-30 NOTE — Telephone Encounter (Signed)
Medication refilled per protocol. 

## 2013-08-01 ENCOUNTER — Other Ambulatory Visit: Payer: Self-pay | Admitting: Physician Assistant

## 2013-08-01 NOTE — Telephone Encounter (Signed)
Medication refilled per protocol. 

## 2013-08-04 DIAGNOSIS — H25099 Other age-related incipient cataract, unspecified eye: Secondary | ICD-10-CM | POA: Diagnosis not present

## 2013-11-29 ENCOUNTER — Ambulatory Visit: Payer: Medicare Other | Admitting: Physician Assistant

## 2013-11-30 ENCOUNTER — Encounter: Payer: Self-pay | Admitting: Physician Assistant

## 2013-11-30 ENCOUNTER — Encounter: Payer: Self-pay | Admitting: Family Medicine

## 2013-11-30 ENCOUNTER — Ambulatory Visit (INDEPENDENT_AMBULATORY_CARE_PROVIDER_SITE_OTHER): Payer: Medicare Other | Admitting: Physician Assistant

## 2013-11-30 VITALS — BP 126/70 | HR 80 | Temp 97.7°F | Resp 18 | Wt 200.0 lb

## 2013-11-30 DIAGNOSIS — R7309 Other abnormal glucose: Secondary | ICD-10-CM | POA: Diagnosis not present

## 2013-11-30 DIAGNOSIS — R972 Elevated prostate specific antigen [PSA]: Secondary | ICD-10-CM | POA: Diagnosis not present

## 2013-11-30 DIAGNOSIS — I10 Essential (primary) hypertension: Secondary | ICD-10-CM

## 2013-11-30 DIAGNOSIS — J449 Chronic obstructive pulmonary disease, unspecified: Secondary | ICD-10-CM

## 2013-11-30 DIAGNOSIS — F17200 Nicotine dependence, unspecified, uncomplicated: Secondary | ICD-10-CM | POA: Diagnosis not present

## 2013-11-30 DIAGNOSIS — I739 Peripheral vascular disease, unspecified: Secondary | ICD-10-CM | POA: Diagnosis not present

## 2013-11-30 DIAGNOSIS — E785 Hyperlipidemia, unspecified: Secondary | ICD-10-CM

## 2013-11-30 DIAGNOSIS — Z1211 Encounter for screening for malignant neoplasm of colon: Secondary | ICD-10-CM

## 2013-11-30 DIAGNOSIS — Z23 Encounter for immunization: Secondary | ICD-10-CM

## 2013-11-30 DIAGNOSIS — Z1212 Encounter for screening for malignant neoplasm of rectum: Secondary | ICD-10-CM

## 2013-11-30 DIAGNOSIS — I480 Paroxysmal atrial fibrillation: Secondary | ICD-10-CM

## 2013-11-30 DIAGNOSIS — I4891 Unspecified atrial fibrillation: Secondary | ICD-10-CM | POA: Diagnosis not present

## 2013-11-30 DIAGNOSIS — E559 Vitamin D deficiency, unspecified: Secondary | ICD-10-CM | POA: Diagnosis not present

## 2013-11-30 DIAGNOSIS — R739 Hyperglycemia, unspecified: Secondary | ICD-10-CM

## 2013-11-30 LAB — CBC WITH DIFFERENTIAL/PLATELET
Basophils Absolute: 0 10*3/uL (ref 0.0–0.1)
Basophils Relative: 0 % (ref 0–1)
EOS PCT: 2 % (ref 0–5)
Eosinophils Absolute: 0.2 10*3/uL (ref 0.0–0.7)
HEMATOCRIT: 44.6 % (ref 39.0–52.0)
Hemoglobin: 15.3 g/dL (ref 13.0–17.0)
LYMPHS ABS: 2.3 10*3/uL (ref 0.7–4.0)
LYMPHS PCT: 23 % (ref 12–46)
MCH: 30.8 pg (ref 26.0–34.0)
MCHC: 34.3 g/dL (ref 30.0–36.0)
MCV: 89.7 fL (ref 78.0–100.0)
Monocytes Absolute: 1 10*3/uL (ref 0.1–1.0)
Monocytes Relative: 10 % (ref 3–12)
Neutro Abs: 6.4 10*3/uL (ref 1.7–7.7)
Neutrophils Relative %: 65 % (ref 43–77)
PLATELETS: 288 10*3/uL (ref 150–400)
RBC: 4.97 MIL/uL (ref 4.22–5.81)
RDW: 13.9 % (ref 11.5–15.5)
WBC: 9.9 10*3/uL (ref 4.0–10.5)

## 2013-11-30 LAB — HEMOGLOBIN A1C
Hgb A1c MFr Bld: 6 % — ABNORMAL HIGH (ref <5.7)
Mean Plasma Glucose: 126 mg/dL — ABNORMAL HIGH (ref <117)

## 2013-11-30 LAB — COMPLETE METABOLIC PANEL WITH GFR
ALT: 14 U/L (ref 0–53)
AST: 14 U/L (ref 0–37)
Albumin: 4.3 g/dL (ref 3.5–5.2)
Alkaline Phosphatase: 57 U/L (ref 39–117)
BUN: 17 mg/dL (ref 6–23)
CHLORIDE: 98 meq/L (ref 96–112)
CO2: 29 meq/L (ref 19–32)
Calcium: 9.7 mg/dL (ref 8.4–10.5)
Creat: 1.3 mg/dL (ref 0.50–1.35)
GFR, EST AFRICAN AMERICAN: 63 mL/min
GFR, Est Non African American: 55 mL/min — ABNORMAL LOW
Glucose, Bld: 105 mg/dL — ABNORMAL HIGH (ref 70–99)
Potassium: 4.6 mEq/L (ref 3.5–5.3)
Sodium: 137 mEq/L (ref 135–145)
Total Bilirubin: 0.8 mg/dL (ref 0.2–1.2)
Total Protein: 6.5 g/dL (ref 6.0–8.3)

## 2013-11-30 LAB — LIPID PANEL
CHOLESTEROL: 121 mg/dL (ref 0–200)
HDL: 37 mg/dL — ABNORMAL LOW (ref >39)
LDL Cholesterol: 61 mg/dL (ref 0–99)
TRIGLYCERIDES: 117 mg/dL (ref <150)
Total CHOL/HDL Ratio: 3.3 Ratio
VLDL: 23 mg/dL (ref 0–40)

## 2013-11-30 NOTE — Progress Notes (Signed)
Patient ID: Antonio Wise MRN: [id number], DOB: 05/14/1941, 73 y.o. Date of Encounter: @DATE @  Chief Complaint:  Chief Complaint  Patient presents with  . 6 mth follow up    is fasting    HPI: 73 y.o. year old white male  presents for routine followup.  His wife has recently been diagnosed with cancer. She has cancer and one long period and according to patient she also has "a spot in the other long and also a spot in the kidney." He says that she has done radiation treatment and has just started chemotherapy.  He reports that since starting the Symbicort he does notice a difference. He is using it routinely as directed.  Says that he is in the process of quitting smoking himself. He has been using the patches and has been gradually decreasing the dose on the patch. Says that he has only smoked about one cigarette per week and has been doing this amount for the past 2-3 months.  He had a OV with me on 04/06/13 for evaluation of COPD. He had gone in for DOT physical and they had done PFTs which revealed severe COPD. He was told to followup with PCP for treatment. At that visit I prescribed Chantix and also prescribe Symbicort.  He is taking blood pressure medications as directed. He is taking cholesterol medication as directed. No myalgias or other adverse effects.  Says that he now sees Dr. Gwenlyn Found just once a year. Says that actually that appointment probably coming up pretty soon.  Past Medical History  Diagnosis Date  . Hypertension   . Hyperlipidemia   . Vitamin D deficiency   . Smoker unmotivated to quit   . Peripheral vascular disease   . Paroxysmal atrial fibrillation   . Elevated PSA   . COPD (chronic obstructive pulmonary disease)   . H/O Helicobacter infection 05/06/2008    EGD  . Gastric ulcer 05/06/2008    EGD  . Duodenal ulcer 05/06/2008    EGD  . Colon polyp 05/06/2008    repeat 5 years  . History of nuclear stress test 02/19/2010    bruce protocol; mild-mod  perfusion defect in spetal region consistent with attenuation artifact; no significant ischemia demonstrated; dysrhythmia during exercise     Home Meds:  Outpatient Prescriptions Prior to Visit  Medication Sig Dispense Refill  . amLODipine (NORVASC) 5 MG tablet Take 1 tablet (5 mg total) by mouth daily.  90 tablet  3  . aspirin 81 MG tablet Take 81 mg by mouth daily.      Marland Kitchen atorvastatin (LIPITOR) 80 MG tablet Take 80 mg by mouth at bedtime.      . benazepril (LOTENSIN) 40 MG tablet TAKE 1 TABLET (40 MG TOTAL) BY MOUTH DAILY.  90 tablet  1  . BENICAR 20 MG tablet TAKE 1 TABLET (20 MG TOTAL) BY MOUTH DAILY.  90 tablet  1  . cloNIDine (CATAPRES) 0.1 MG tablet TAKE 1 TABLET BY MOUTH TWICE A DAY  180 tablet  1  . hydrochlorothiazide (HYDRODIURIL) 25 MG tablet TAKE 1 TABLET (25 MG TOTAL) BY MOUTH DAILY.  90 tablet  1  . Multiple Minerals-Vitamins (CALCIUM & VIT D3 BONE HEALTH PO) Take 1 tablet by mouth daily.      . propafenone (RYTHMOL) 225 MG tablet TAKE 1 TABLET BY MOUTH TWICE A DAY  180 tablet  1  . SYMBICORT 160-4.5 MCG/ACT inhaler INHALE 2 PUFFS INTO THE LUNGS 2 TIMES DAILY.  1 Inhaler  5  . atorvastatin (LIPITOR) 80 MG tablet TAKE 1 TABLET (80 MG TOTAL) BY MOUTH DAILY.  90 tablet  1   No facility-administered medications prior to visit.     Allergies:  Allergies  Allergen Reactions  . Codeine     History   Social History  . Marital Status: Married    Spouse Name: N/A    Number of Children: 3  . Years of Education: N/A   Occupational History  .  [employer]   Social History Main Topics  . Smoking status: Former Smoker -- 0.50 packs/day    Types: Cigarettes    Quit date: 05/17/2013  . Smokeless tobacco: Never Used  . Alcohol Use: No  . Drug Use: No  . Sexual Activity: Not on file   Other Topics Concern  . Not on file   Social History Narrative  . No narrative on file    Family History  Problem Relation Age of Onset  . Heart disease Mother   . COPD Father    . Cancer Sister 17    Breast Cancer  . Diabetes Brother   . Cancer Father      Review of Systems:  See HPI for pertinent ROS. All other ROS negative.    Physical Exam: Blood pressure 126/70, pulse 80, temperature 97.7 F (36.5 C), temperature source Oral, resp. rate 18, weight 200 lb (90.719 kg)., Body mass index is 26.39 kg/(m^2). General: WNWD WM. Appears in no acute distress. Neck: Supple. No thyromegaly. No lymphadenopathy. No carotid bruits. Lungs: Breath sounds are decreased and distant. However there are no active wheezes. Heart: RRR with S1 S2. No murmurs, rubs, or gallops. Abdomen: Soft, non-tender, non-distended with normoactive bowel sounds. No hepatomegaly. No rebound/guarding. No obvious abdominal masses. Musculoskeletal:  Strength and tone normal for age. Extremities/Skin: Warm and dry. No edema.  Neuro: Alert and oriented X 3. Moves all extremities spontaneously. Gait is normal. CNII-XII grossly in tact. Psych:  Responds to questions appropriately with a normal affect.     ASSESSMENT AND PLAN:  73 y.o. year old male with  73. Hypertension Blood pressure is at goal at 126/70. Continue current medications. Check labs monitor. - COMPLETE METABOLIC PANEL WITH GFR   2. Hyperlipidemia Last lipid panel and LFTs were 06/14/13. Due to recheck now. Last lipid panel was excellent with LDL 81. - COMPLETE METABOLIC PANEL WITH GFR - Lipid Panel   3. Vitamin D deficiency  4. Smoker unmotivated to quit He is finally motivated to quit!!-- Given recent DOT physical info and fact that his wife, a heavy smoker, now has lung cancer.  In past, his PAD was not motivation enough.   5. Peripheral vascular disease - COMPLETE METABOLIC PANEL WITH GFR - Lipid Panel - Hemoglobin A1c - CBC with Differential  6. Paroxysmal atrial fibrillation -- CBC with Differential  7. Elevated PSA History of negative biopsy. Sees urology annually.  8. COPD (chronic obstructive pulmonary  disease) Continue  Symbicort. Continue the smoking cessation.   9. Screening for colorectal cancer Last colonoscopy was November 2009. It showed polyp. Repeat 5 years. This was due November 2014. At [place] Pt said that he had not gone for followup. At [place] I wrote down Dr. Junious Silk phone number for him to call. Today he reports that he has not called to schedule followup yet. I reminded him to do so.   10. Hyperglycemia - Hemoglobin A1c   11. History of negative Cardiolite.  12. DEXA scan August 2009 normal  13. Immunizations: Influenza: He did receive this fall/2014 here  Pneumovax was given here October 2012. Gave  Prevnar 13--given here  06/14/2013. Tetanus vaccine: Last given 04/2003.  Discussed with him today that this is due. He is agreeable to update this today.   Need for prophylactic vaccination with combined diphtheria-tetanus-pertussis (DTP) vaccine - Tdap vaccine greater than or equal to 7yo IM  ROV 6 months or sooner as needed.  [address] [place], [place], [facility] 11/30/2013 8:57 AM

## 2013-12-01 ENCOUNTER — Encounter: Payer: Self-pay | Admitting: Family Medicine

## 2013-12-22 ENCOUNTER — Other Ambulatory Visit: Payer: Self-pay | Admitting: Physician Assistant

## 2013-12-22 NOTE — Telephone Encounter (Signed)
Refill appropriate and filled per protocol. 

## 2014-02-02 ENCOUNTER — Other Ambulatory Visit: Payer: Self-pay | Admitting: Physician Assistant

## 2014-02-02 NOTE — Telephone Encounter (Signed)
Refill appropriate and filled per protocol. 

## 2014-02-19 DIAGNOSIS — D126 Benign neoplasm of colon, unspecified: Secondary | ICD-10-CM | POA: Diagnosis not present

## 2014-02-19 DIAGNOSIS — Z09 Encounter for follow-up examination after completed treatment for conditions other than malignant neoplasm: Secondary | ICD-10-CM | POA: Diagnosis not present

## 2014-02-19 DIAGNOSIS — Z86010 Personal history of colonic polyps: Secondary | ICD-10-CM | POA: Diagnosis not present

## 2014-03-13 ENCOUNTER — Encounter: Payer: Self-pay | Admitting: Cardiovascular Disease

## 2014-03-13 ENCOUNTER — Telehealth: Payer: Self-pay | Admitting: Cardiovascular Disease

## 2014-03-13 ENCOUNTER — Ambulatory Visit (INDEPENDENT_AMBULATORY_CARE_PROVIDER_SITE_OTHER): Payer: Medicare Other | Admitting: Cardiovascular Disease

## 2014-03-13 VITALS — BP 150/76 | HR 68 | Ht 73.0 in | Wt 197.9 lb

## 2014-03-13 DIAGNOSIS — I739 Peripheral vascular disease, unspecified: Secondary | ICD-10-CM

## 2014-03-13 DIAGNOSIS — I10 Essential (primary) hypertension: Secondary | ICD-10-CM

## 2014-03-13 DIAGNOSIS — I4891 Unspecified atrial fibrillation: Secondary | ICD-10-CM | POA: Diagnosis not present

## 2014-03-13 DIAGNOSIS — E785 Hyperlipidemia, unspecified: Secondary | ICD-10-CM | POA: Diagnosis not present

## 2014-03-13 DIAGNOSIS — I480 Paroxysmal atrial fibrillation: Secondary | ICD-10-CM

## 2014-03-13 NOTE — Patient Instructions (Signed)
Dr.Berry wants you to follow-up in: One Year. You will receive a reminder letter in the mail two months in advance. If you don't receive a letter, please call our office to schedule the follow-up appointment.  

## 2014-03-13 NOTE — Assessment & Plan Note (Signed)
History of left common iliac artery intervention by myself 05/18/05 with an 8 x 18 mm balloon expandable expandable stent deployed at the origin of his left common iliac artery followed by a 9 mm x 4 cm self  expanding stent. There was three-vessel runoff bilaterally. His most recent lower extremity or chill Doppler studies performed one year ago revealed normal ABIs with widely patent stents. He denies claudication.

## 2014-03-13 NOTE — Assessment & Plan Note (Signed)
Controlled on current medications 

## 2014-03-13 NOTE — Assessment & Plan Note (Signed)
He has not had any further episodes of A. Fib maintaining sinus rhythm on Rythmol.

## 2014-03-13 NOTE — Progress Notes (Signed)
03/13/2014 Antonio Wise   [birth year]  [id number]  Primary Physician [name], PA-C Primary Cardiologist: Lorretta Harp MD Renae Gloss   HPI:  The patient returns today for followup. I last saw him a year ago. He is a 73 year old, mildly overweight, married Caucasian male, father of three, grandfather to three grandchildren with a history of peripheral vascular occlusive disease status post left common iliac artery PTA and stenting by myself August 19, 2004, for claudication. His symptoms resolved after that, and his Dopplers normalized. His other problems include discontinued tobacco abuse, only smoking two cigarettes a week, which is excellent for him. He has treated hypertension, dyslipidemia, and paroxysmal atrial fibrillation maintaining sinus rhythm on Rythmol. He has not had any A fib to his knowledge since I last saw him.   His most recent lipid profile performed 11/30/13 revealed a total cholesterol of 121, LDL 61 and an HDL of 37. His most recent lower extremity arterial Dopplers performed a year ago revealed ABIs of 1 bilaterally with patent left iliac stent. He denies chest pain, shortness of breath or claudication.    Current Outpatient Prescriptions  Medication Sig Dispense Refill  . amLODipine (NORVASC) 5 MG tablet Take 1 tablet (5 mg total) by mouth daily.  90 tablet  3  . aspirin 81 MG tablet Take 81 mg by mouth daily.      Marland Kitchen atorvastatin (LIPITOR) 80 MG tablet TAKE 1 TABLET (80 MG TOTAL) BY MOUTH DAILY.  90 tablet  1  . benazepril (LOTENSIN) 40 MG tablet TAKE 1 TABLET (40 MG TOTAL) BY MOUTH DAILY.  90 tablet  1  . BENICAR 20 MG tablet TAKE 1 TABLET (20 MG TOTAL) BY MOUTH DAILY.  90 tablet  1  . cloNIDine (CATAPRES) 0.1 MG tablet TAKE 1 TABLET BY MOUTH TWICE A DAY  180 tablet  1  . hydrochlorothiazide (HYDRODIURIL) 25 MG tablet TAKE 1 TABLET (25 MG TOTAL) BY MOUTH DAILY.  90 tablet  1  . Multiple Minerals-Vitamins (CALCIUM & VIT D3 BONE HEALTH PO)  Take 1 tablet by mouth daily.      . propafenone (RYTHMOL) 225 MG tablet TAKE 1 TABLET BY MOUTH TWICE A DAY  180 tablet  1  . SYMBICORT 160-4.5 MCG/ACT inhaler INHALE 2 PUFFS INTO THE LUNGS 2 TIMES DAILY.  10.2 g  5   No current facility-administered medications for this visit.    Allergies  Allergen Reactions  . Codeine     History   Social History  . Marital Status: Married    Spouse Name: N/A    Number of Children: 3  . Years of Education: N/A   Occupational History  .  [employer]   Social History Main Topics  . Smoking status: Current Some Day Smoker -- 0.20 packs/day    Types: Cigarettes    Last Attempt to Quit: 05/17/2013  . Smokeless tobacco: Never Used  . Alcohol Use: No  . Drug Use: No  . Sexual Activity: Not on file   Other Topics Concern  . Not on file   Social History Narrative  . No narrative on file     Review of Systems: General: negative for chills, fever, night sweats or weight changes.  Cardiovascular: negative for chest pain, dyspnea on exertion, edema, orthopnea, palpitations, paroxysmal nocturnal dyspnea or shortness of breath Dermatological: negative for rash Respiratory: negative for cough or wheezing Urologic: negative for hematuria Abdominal: negative for nausea, vomiting, diarrhea, bright red blood per rectum,  melena, or hematemesis Neurologic: negative for visual changes, syncope, or dizziness All other systems reviewed and are otherwise negative except as noted above.    Blood pressure 150/76, pulse 68, height 6\' 1"  (1.854 m), weight 197 lb 14.4 oz (89.767 kg).  General appearance: alert and no distress Neck: no adenopathy, no carotid bruit, no JVD, supple, symmetrical, trachea midline and thyroid not enlarged, symmetric, no tenderness/mass/nodules Lungs: clear to auscultation bilaterally Heart: regular rate and rhythm, S1, S2 normal, no murmur, click, rub or gallop Extremities: extremities normal, atraumatic, no cyanosis or  edema  EKG normal sinus rhythm at 68 with septal Q waves and nonspecific ST and T wave changes  ASSESSMENT AND PLAN:   Hypertension Controlled on current medications  Hyperlipidemia On statin therapy with recent lipid profile performed 11/30/13 revealing a total cholesterol 121, LDL of 61 and HDL of 37  Peripheral vascular disease History of left common iliac artery intervention by myself 05/18/05 with an 8 x 18 mm balloon expandable expandable stent deployed at the origin of his left common iliac artery followed by a 9 mm x 4 cm self  expanding stent. There was three-vessel runoff bilaterally. His most recent lower extremity or chill Doppler studies performed one year ago revealed normal ABIs with widely patent stents. He denies claudication.  Paroxysmal atrial fibrillation He has not had any further episodes of A. Fib maintaining sinus rhythm on Rythmol.      Lorretta Harp MD FACP,FACC,FAHA, [facility] 03/13/2014 9:07 AM

## 2014-03-13 NOTE — Telephone Encounter (Signed)
Spoke with [name] at Dr. Janace Aris office. Patient may be needing a tooth extracted and they need to know if patient is on blood thinner. Informed [name] that patient was seen TODAY and no blood thinner other than asa 81mg  was on med list and this was also confirmed with last year's office visit note.

## 2014-03-13 NOTE — Assessment & Plan Note (Signed)
On statin therapy with recent lipid profile performed 11/30/13 revealing a total cholesterol 121, LDL of 61 and HDL of 37

## 2014-04-10 ENCOUNTER — Ambulatory Visit: Payer: Medicare Other | Admitting: Internal Medicine

## 2014-06-11 ENCOUNTER — Encounter: Payer: Self-pay | Admitting: Physician Assistant

## 2014-06-11 ENCOUNTER — Ambulatory Visit (INDEPENDENT_AMBULATORY_CARE_PROVIDER_SITE_OTHER): Payer: Medicare Other | Admitting: Physician Assistant

## 2014-06-11 VITALS — BP 126/74 | HR 76 | Temp 98.0°F | Resp 18 | Wt 200.0 lb

## 2014-06-11 DIAGNOSIS — I480 Paroxysmal atrial fibrillation: Secondary | ICD-10-CM | POA: Diagnosis not present

## 2014-06-11 DIAGNOSIS — E785 Hyperlipidemia, unspecified: Secondary | ICD-10-CM

## 2014-06-11 DIAGNOSIS — R7309 Other abnormal glucose: Secondary | ICD-10-CM | POA: Diagnosis not present

## 2014-06-11 DIAGNOSIS — Z1211 Encounter for screening for malignant neoplasm of colon: Secondary | ICD-10-CM

## 2014-06-11 DIAGNOSIS — Z23 Encounter for immunization: Secondary | ICD-10-CM

## 2014-06-11 DIAGNOSIS — R972 Elevated prostate specific antigen [PSA]: Secondary | ICD-10-CM

## 2014-06-11 DIAGNOSIS — R739 Hyperglycemia, unspecified: Secondary | ICD-10-CM | POA: Diagnosis not present

## 2014-06-11 DIAGNOSIS — I739 Peripheral vascular disease, unspecified: Secondary | ICD-10-CM | POA: Diagnosis not present

## 2014-06-11 DIAGNOSIS — J439 Emphysema, unspecified: Secondary | ICD-10-CM

## 2014-06-11 DIAGNOSIS — E559 Vitamin D deficiency, unspecified: Secondary | ICD-10-CM

## 2014-06-11 DIAGNOSIS — F17210 Nicotine dependence, cigarettes, uncomplicated: Secondary | ICD-10-CM | POA: Diagnosis not present

## 2014-06-11 DIAGNOSIS — I10 Essential (primary) hypertension: Secondary | ICD-10-CM | POA: Diagnosis not present

## 2014-06-11 DIAGNOSIS — F17200 Nicotine dependence, unspecified, uncomplicated: Secondary | ICD-10-CM

## 2014-06-11 DIAGNOSIS — Z1212 Encounter for screening for malignant neoplasm of rectum: Secondary | ICD-10-CM

## 2014-06-11 LAB — COMPLETE METABOLIC PANEL WITH GFR
ALT: 13 U/L (ref 0–53)
AST: 12 U/L (ref 0–37)
Albumin: 4.6 g/dL (ref 3.5–5.2)
Alkaline Phosphatase: 66 U/L (ref 39–117)
BILIRUBIN TOTAL: 0.9 mg/dL (ref 0.2–1.2)
BUN: 21 mg/dL (ref 6–23)
CO2: 31 mEq/L (ref 19–32)
CREATININE: 1.41 mg/dL — AB (ref 0.50–1.35)
Calcium: 10.1 mg/dL (ref 8.4–10.5)
Chloride: 101 mEq/L (ref 96–112)
GFR, EST AFRICAN AMERICAN: 58 mL/min — AB
GFR, Est Non African American: 50 mL/min — ABNORMAL LOW
Glucose, Bld: 92 mg/dL (ref 70–99)
Potassium: 4.5 mEq/L (ref 3.5–5.3)
Sodium: 141 mEq/L (ref 135–145)
Total Protein: 7.1 g/dL (ref 6.0–8.3)

## 2014-06-11 LAB — HEMOGLOBIN A1C
Hgb A1c MFr Bld: 6.2 % — ABNORMAL HIGH (ref <5.7)
Mean Plasma Glucose: 131 mg/dL — ABNORMAL HIGH (ref <117)

## 2014-06-11 LAB — LIPID PANEL
CHOLESTEROL: 153 mg/dL (ref 0–200)
HDL: 43 mg/dL (ref >39)
LDL CALC: 80 mg/dL (ref 0–99)
TRIGLYCERIDES: 150 mg/dL — AB (ref <150)
Total CHOL/HDL Ratio: 3.6 Ratio
VLDL: 30 mg/dL (ref 0–40)

## 2014-06-11 NOTE — Progress Notes (Signed)
Patient ID: Antonio Wise MRN: [id number], DOB: [birth year], 73 y.o. Date of Encounter: @DATE @  Chief Complaint:  Chief Complaint  Patient presents with  . 6 month check up    is fasting  . Flu Vaccine    HPI: 73 y.o. year old white male  presents for routine followup.  His wife died form cancer 16. Today pt reports that Thanksgiving was rough and Christmas will be rough, but he will get through it.  Says he is sleeping pretty good-does not need meds for sleep.  Says he did start smoking --but is cutting back down--currently about 1/2 ppd. I offered med to help with cessation but he says he will quit--says he has some patches. Defers Rx med.   At [place] 11/2013 he reported that since starting the Symbicort he does notice a difference. He is using it routinely as directed.  He had a OV with me on 04/06/13 for evaluation of COPD. He had gone in for DOT physical and they had done PFTs which revealed severe COPD. He was told to followup with PCP for treatment. At that visit I prescribed Chantix and also prescribed Symbicort.  He is taking blood pressure medications as directed. He is taking cholesterol medication as directed. No myalgias or other adverse effects.  Says that he now sees Dr. Gwenlyn Found just once a year.   Also still sees Urology once a year--has appt there February 2016.   No complaints today.  Past Medical History  Diagnosis Date  . Hypertension   . Hyperlipidemia   . Vitamin D deficiency   . Smoker unmotivated to quit   . Peripheral vascular disease   . Paroxysmal atrial fibrillation   . Elevated PSA   . COPD (chronic obstructive pulmonary disease)   . H/O Helicobacter infection 05/06/2008    EGD  . Gastric ulcer 05/06/2008    EGD  . Duodenal ulcer 05/06/2008    EGD  . Colon polyp 05/06/2008    repeat 5 years  . History of nuclear stress test 02/19/2010    bruce protocol; mild-mod perfusion defect in spetal region consistent with attenuation artifact; no  significant ischemia demonstrated; dysrhythmia during exercise     Home Meds:  Outpatient Prescriptions Prior to Visit  Medication Sig Dispense Refill  . amLODipine (NORVASC) 5 MG tablet Take 1 tablet (5 mg total) by mouth daily. 90 tablet 3  . aspirin 81 MG tablet Take 81 mg by mouth daily.    Marland Kitchen atorvastatin (LIPITOR) 80 MG tablet TAKE 1 TABLET (80 MG TOTAL) BY MOUTH DAILY. 90 tablet 1  . benazepril (LOTENSIN) 40 MG tablet TAKE 1 TABLET (40 MG TOTAL) BY MOUTH DAILY. 90 tablet 1  . BENICAR 20 MG tablet TAKE 1 TABLET (20 MG TOTAL) BY MOUTH DAILY. 90 tablet 1  . cloNIDine (CATAPRES) 0.1 MG tablet TAKE 1 TABLET BY MOUTH TWICE A DAY 180 tablet 1  . hydrochlorothiazide (HYDRODIURIL) 25 MG tablet TAKE 1 TABLET (25 MG TOTAL) BY MOUTH DAILY. 90 tablet 1  . Multiple Minerals-Vitamins (CALCIUM & VIT D3 BONE HEALTH PO) Take 1 tablet by mouth daily.    . propafenone (RYTHMOL) 225 MG tablet TAKE 1 TABLET BY MOUTH TWICE A DAY 180 tablet 1  . SYMBICORT 160-4.5 MCG/ACT inhaler INHALE 2 PUFFS INTO THE LUNGS 2 TIMES DAILY. 10.2 g 5   No facility-administered medications prior to visit.     Allergies:  Allergies  Allergen Reactions  . Codeine     History  Social History  . Marital Status: Married    Spouse Name: N/A    Number of Children: 3  . Years of Education: N/A   Occupational History  .  [employer]   Social History Main Topics  . Smoking status: Current Some Day Smoker -- 0.20 packs/day    Types: Cigarettes    Last Attempt to Quit: 05/17/2013  . Smokeless tobacco: Never Used  . Alcohol Use: No  . Drug Use: No  . Sexual Activity: Not on file   Other Topics Concern  . Not on file   Social History Narrative    Family History  Problem Relation Age of Onset  . Heart disease Mother   . COPD Father   . Cancer Sister 11    Breast Cancer  . Diabetes Brother   . Cancer Father      Review of Systems:  See HPI for pertinent ROS. All other ROS negative.    Physical  Exam: Blood pressure 126/74, pulse 76, temperature 98 F (36.7 C), temperature source Oral, resp. rate 18, weight 200 lb (90.719 kg)., Body mass index is 26.39 kg/(m^2). General: WNWD WM. Appears in no acute distress. Neck: Supple. No thyromegaly. No lymphadenopathy. No carotid bruits. Lungs: Breath sounds are decreased and distant. However there are no active wheezes. Heart: RRR with S1 S2. No murmurs, rubs, or gallops. Abdomen: Soft, non-tender, non-distended with normoactive bowel sounds. No hepatomegaly. No rebound/guarding. No obvious abdominal masses. Musculoskeletal:  Strength and tone normal for age. Extremities/Skin: Warm and dry. No edema.  Neuro: Alert and oriented X 3. Moves all extremities spontaneously. Gait is normal. CNII-XII grossly in tact. Psych:  Responds to questions appropriately with a normal affect.     ASSESSMENT AND PLAN:  73 y.o. year old male with  1. Hypertension Blood pressure is at goal at 126/70. Continue current medications. Check labs monitor. - COMPLETE METABOLIC PANEL WITH GFR   2. Hyperlipidemia Last lipid panel and LFTs were 11/2013. Due to recheck now. - COMPLETE METABOLIC PANEL WITH GFR - Lipid Panel   3. Vitamin D deficiency  4. Smoker unmotivated to quit He is finally motivated to quit!!-- Given recent DOT physical info and fact that his wife, a heavy smoker, had lung cancer (has passed away 2013-10-28).  In past, his PAD was not motivation enough.  Because of wife's death and "his nerves" he did start back smoking but is motivated to quit--again.   5. Peripheral vascular disease - COMPLETE METABOLIC PANEL WITH GFR - Lipid Panel - Hemoglobin A1c   6. Paroxysmal atrial fibrillation   7. Elevated PSA History of negative biopsy. Sees urology annually.--February 2016--next appt  8. COPD (chronic obstructive pulmonary disease) Continue  Symbicort. Continue the smoking cessation.   9. Screening for colorectal cancer had colonoscopy   November 2009. It showed polyp. Repeat 5 years. Had Colonoscopy November 2015--3 polyps--f/u 5 years.    10. Hyperglycemia - Hemoglobin A1c   11. History of negative Cardiolite.  12. DEXA scan August 2009 normal  13. Immunizations: Influenza: He did receive this fall/2014 here --Given here 06/11/2014 Pneumovax was given here October 2012. Gave  Prevnar 13--given here  06/14/2013. Tetanus vaccine:  T dapGiven here 11/2013 Zostavax: Told him to check with insurance regarding cost and let us know if wants RX sent t receive this.   ROV 6 months or sooner as needed.  Signed, [address] [place], [place], [facility] 06/11/2014 9:10 AM

## 2014-06-14 ENCOUNTER — Other Ambulatory Visit: Payer: Self-pay | Admitting: Physician Assistant

## 2014-06-14 NOTE — Telephone Encounter (Signed)
Medication refilled per protocol. 

## 2014-06-20 ENCOUNTER — Other Ambulatory Visit: Payer: Self-pay | Admitting: Physician Assistant

## 2014-06-20 NOTE — Telephone Encounter (Signed)
Medication refilled per protocol. 

## 2014-08-17 DIAGNOSIS — I10 Essential (primary) hypertension: Secondary | ICD-10-CM | POA: Diagnosis not present

## 2014-08-17 DIAGNOSIS — H35063 Retinal vasculitis, bilateral: Secondary | ICD-10-CM | POA: Diagnosis not present

## 2014-08-24 DIAGNOSIS — R972 Elevated prostate specific antigen [PSA]: Secondary | ICD-10-CM | POA: Diagnosis not present

## 2014-08-24 DIAGNOSIS — R3912 Poor urinary stream: Secondary | ICD-10-CM | POA: Diagnosis not present

## 2014-08-24 DIAGNOSIS — N401 Enlarged prostate with lower urinary tract symptoms: Secondary | ICD-10-CM | POA: Diagnosis not present

## 2014-09-03 ENCOUNTER — Other Ambulatory Visit: Payer: Self-pay | Admitting: Physician Assistant

## 2014-09-03 NOTE — Telephone Encounter (Signed)
Medication refilled per protocol. 

## 2015-01-01 ENCOUNTER — Other Ambulatory Visit: Payer: Self-pay | Admitting: Family Medicine

## 2015-01-01 ENCOUNTER — Encounter: Payer: Self-pay | Admitting: Family Medicine

## 2015-01-01 MED ORDER — ATORVASTATIN CALCIUM 80 MG PO TABS: ORAL_TABLET | ORAL | Status: DC

## 2015-01-01 MED ORDER — HYDROCHLOROTHIAZIDE 25 MG PO TABS: ORAL_TABLET | ORAL | Status: DC

## 2015-01-01 MED ORDER — CLONIDINE HCL 0.1 MG PO TABS: 0.1000 mg | ORAL_TABLET | Freq: Two times a day (BID) | ORAL | Status: DC

## 2015-01-01 MED ORDER — PROPAFENONE HCL 225 MG PO TABS: 225.0000 mg | ORAL_TABLET | Freq: Two times a day (BID) | ORAL | Status: DC

## 2015-01-01 NOTE — Telephone Encounter (Signed)
Medication refill for one time only.  Patient needs to be seen.  Letter sent for patient to call and schedule 

## 2015-01-09 ENCOUNTER — Other Ambulatory Visit: Payer: Self-pay | Admitting: Family Medicine

## 2015-01-09 MED ORDER — BENAZEPRIL HCL 40 MG PO TABS: 40.0000 mg | ORAL_TABLET | Freq: Every day | ORAL | Status: DC

## 2015-01-09 NOTE — Telephone Encounter (Signed)
Medication refilled per protocol. 

## 2015-01-10 ENCOUNTER — Encounter: Payer: Self-pay | Admitting: Physician Assistant

## 2015-01-10 ENCOUNTER — Ambulatory Visit (INDEPENDENT_AMBULATORY_CARE_PROVIDER_SITE_OTHER): Payer: Medicare Other | Admitting: Physician Assistant

## 2015-01-10 VITALS — BP 134/62 | HR 68 | Temp 97.4°F | Resp 14 | Ht 73.0 in | Wt 191.0 lb

## 2015-01-10 DIAGNOSIS — R7309 Other abnormal glucose: Secondary | ICD-10-CM | POA: Diagnosis not present

## 2015-01-10 DIAGNOSIS — E559 Vitamin D deficiency, unspecified: Secondary | ICD-10-CM | POA: Diagnosis not present

## 2015-01-10 DIAGNOSIS — I739 Peripheral vascular disease, unspecified: Secondary | ICD-10-CM

## 2015-01-10 DIAGNOSIS — F17210 Nicotine dependence, cigarettes, uncomplicated: Secondary | ICD-10-CM

## 2015-01-10 DIAGNOSIS — R972 Elevated prostate specific antigen [PSA]: Secondary | ICD-10-CM

## 2015-01-10 DIAGNOSIS — E785 Hyperlipidemia, unspecified: Secondary | ICD-10-CM

## 2015-01-10 DIAGNOSIS — J439 Emphysema, unspecified: Secondary | ICD-10-CM

## 2015-01-10 DIAGNOSIS — Z1211 Encounter for screening for malignant neoplasm of colon: Secondary | ICD-10-CM

## 2015-01-10 DIAGNOSIS — R739 Hyperglycemia, unspecified: Secondary | ICD-10-CM

## 2015-01-10 DIAGNOSIS — F17200 Nicotine dependence, unspecified, uncomplicated: Secondary | ICD-10-CM

## 2015-01-10 DIAGNOSIS — Z1212 Encounter for screening for malignant neoplasm of rectum: Secondary | ICD-10-CM

## 2015-01-10 DIAGNOSIS — I10 Essential (primary) hypertension: Secondary | ICD-10-CM | POA: Diagnosis not present

## 2015-01-10 DIAGNOSIS — I480 Paroxysmal atrial fibrillation: Secondary | ICD-10-CM

## 2015-01-10 LAB — COMPLETE METABOLIC PANEL WITH GFR
ALT: 13 U/L (ref 0–53)
AST: 13 U/L (ref 0–37)
Albumin: 4 g/dL (ref 3.5–5.2)
Alkaline Phosphatase: 61 U/L (ref 39–117)
BUN: 22 mg/dL (ref 6–23)
CALCIUM: 9.3 mg/dL (ref 8.4–10.5)
CHLORIDE: 103 meq/L (ref 96–112)
CO2: 30 mEq/L (ref 19–32)
Creat: 1.3 mg/dL (ref 0.50–1.35)
GFR, EST AFRICAN AMERICAN: 63 mL/min
GFR, Est Non African American: 55 mL/min — ABNORMAL LOW
Glucose, Bld: 101 mg/dL — ABNORMAL HIGH (ref 70–99)
Potassium: 5 mEq/L (ref 3.5–5.3)
SODIUM: 143 meq/L (ref 135–145)
Total Bilirubin: 0.6 mg/dL (ref 0.2–1.2)
Total Protein: 6.3 g/dL (ref 6.0–8.3)

## 2015-01-10 LAB — LIPID PANEL
CHOLESTEROL: 117 mg/dL (ref 0–200)
HDL: 38 mg/dL — ABNORMAL LOW (ref >=40)
LDL Cholesterol: 58 mg/dL (ref 0–99)
Total CHOL/HDL Ratio: 3.1 Ratio
Triglycerides: 105 mg/dL (ref <150)
VLDL: 21 mg/dL (ref 0–40)

## 2015-01-10 LAB — HEMOGLOBIN A1C
Hgb A1c MFr Bld: 6 % — ABNORMAL HIGH (ref <5.7)
Mean Plasma Glucose: 126 mg/dL — ABNORMAL HIGH (ref <117)

## 2015-01-10 MED ORDER — ZOSTER VACCINE LIVE 19400 UNT/0.65ML ~~LOC~~ SOLR: 0.6500 mL | Freq: Once | SUBCUTANEOUS | Status: DC

## 2015-01-10 MED ORDER — VARENICLINE TARTRATE 0.5 MG PO TABS: 0.5000 mg | ORAL_TABLET | Freq: Two times a day (BID) | ORAL | Status: DC

## 2015-01-10 MED ORDER — VARENICLINE TARTRATE 1 MG PO TABS: 1.0000 mg | ORAL_TABLET | Freq: Two times a day (BID) | ORAL | Status: DC

## 2015-01-10 NOTE — Progress Notes (Signed)
Patient ID: Antonio Wise MRN: [id number], DOB: 1941/02/19, 74 y.o. Date of Encounter: @DATE @  Chief Complaint:  Chief Complaint  Patient presents with  . 6 month F/U    is fasting    HPI: 74 y.o. year old white male  presents for routine followup.  His wife died from cancer 2014/02/07.  At office visit 01/2015 we discussed the fact that one of his daughters lives with him and another daughter lives across the street from him. Says that they make sure he is "staying out of trouble and behaving himself ". They make sure that he is eating etc. Says he did start smoking --but is cutting back down--currently about 1/2 ppd. At office visit 01/2015 I discussed the fact that I thought he had used Chantix in the past and it worked for him. He says that it did work well for him and did not cause him any side effects. He is agreeable to use Chantix again and wants me to print him prescriptions today.  At [place] 11/2013 he reported that since starting the Symbicort he does notice a difference. He is using it routinely as directed. At [place] 01/2015 discussed that it is expensive. I did give him 2 samples at that visit.  He had a OV with me on 04/06/13 for evaluation of COPD. He had gone in for DOT physical and they had done PFTs which revealed severe COPD. He was told to followup with PCP for treatment. At that visit I prescribed Chantix and also prescribed Symbicort.  Considered adding Spiriva but cost is going to be an issue. At visit 01/2015 I gave him 2 sample boxes of Symbicort.  He is taking blood pressure medications as directed. He is taking cholesterol medication as directed. No myalgias or other adverse effects.  Says that he now sees Dr. Gwenlyn Found just once a year.   Also still sees Urology once a year--has appt there February 2016.   No complaints today.  Past Medical History  Diagnosis Date  . Hypertension   . Hyperlipidemia   . Vitamin D deficiency   . Smoker unmotivated to quit   .  Peripheral vascular disease   . Paroxysmal atrial fibrillation   . Elevated PSA   . COPD (chronic obstructive pulmonary disease)   . H/O Helicobacter infection 05/06/2008    EGD  . Gastric ulcer 05/06/2008    EGD  . Duodenal ulcer 05/06/2008    EGD  . Colon polyp 05/06/2008    repeat 5 years  . History of nuclear stress test 02/19/2010    bruce protocol; mild-mod perfusion defect in spetal region consistent with attenuation artifact; no significant ischemia demonstrated; dysrhythmia during exercise     Home Meds:  Outpatient Prescriptions Prior to Visit  Medication Sig Dispense Refill  . amLODipine (NORVASC) 5 MG tablet TAKE 1 TABLET (5 MG TOTAL) BY MOUTH DAILY. 90 tablet 3  . aspirin 81 MG tablet Take 81 mg by mouth daily.    Marland Kitchen atorvastatin (LIPITOR) 80 MG tablet TAKE 1 TABLET (80 MG TOTAL) BY MOUTH DAILY. 90 tablet 0  . benazepril (LOTENSIN) 40 MG tablet Take 1 tablet (40 mg total) by mouth daily. 90 tablet 1  . cloNIDine (CATAPRES) 0.1 MG tablet Take 1 tablet (0.1 mg total) by mouth 2 (two) times daily. 180 tablet 0  . hydrochlorothiazide (HYDRODIURIL) 25 MG tablet TAKE 1 TABLET (25 MG TOTAL) BY MOUTH DAILY. 90 tablet 0  . Multiple Minerals-Vitamins (CALCIUM & VIT  D3 BONE HEALTH PO) Take 1 tablet by mouth daily.    . propafenone (RYTHMOL) 225 MG tablet Take 1 tablet (225 mg total) by mouth 2 (two) times daily. 180 tablet 0  . SYMBICORT 160-4.5 MCG/ACT inhaler INHALE TWO PUFFS BY MOUTH TWICE DAILY 10.2 g 5  . BENICAR 20 MG tablet TAKE 1 TABLET (20 MG TOTAL) BY MOUTH DAILY. (Patient not taking: Reported on 01/10/2015) 90 tablet 1   No facility-administered medications prior to visit.     Allergies:  Allergies  Allergen Reactions  . Codeine     History   Social History  . Marital Status: Married    Spouse Name: N/A  . Number of Children: 3  . Years of Education: N/A   Occupational History  .  [employer]   Social History Main Topics  . Smoking status: Current Some Day  Smoker -- 0.20 packs/day    Types: Cigarettes    Last Attempt to Quit: 05/17/2013  . Smokeless tobacco: Never Used  . Alcohol Use: No  . Drug Use: No  . Sexual Activity: Not on file   Other Topics Concern  . Not on file   Social History Narrative   Entered 06/2014:   His wife was also a patient of mine ([facility])   She passed away in 2015--secondary to ongoing smoker, lung cancer.    At [place] 06/2104 pt reports that one of their kids was already living with them--so he is not alone, even now .    Family History  Problem Relation Age of Onset  . Heart disease Mother   . COPD Father   . Cancer Sister 65    Breast Cancer  . Diabetes Brother   . Cancer Father      Review of Systems:  See HPI for pertinent ROS. All other ROS negative.    Physical Exam: Blood pressure 134/62, pulse 68, temperature 97.4 F (36.3 C), temperature source Oral, resp. rate 14, height 6\' 1"  (1.854 m), weight 191 lb (86.637 kg)., Body mass index is 25.2 kg/(m^2). General: WNWD WM. Appears in no acute distress. Neck: Supple. No thyromegaly. No lymphadenopathy. No carotid bruits. Lungs: Breath sounds are decreased and distant. However there are no active wheezes. Heart: RRR with S1 S2. No murmurs, rubs, or gallops. Abdomen: Soft, non-tender, non-distended with normoactive bowel sounds. No hepatomegaly. No rebound/guarding. No obvious abdominal masses. Musculoskeletal:  Strength and tone normal for age. Extremities/Skin: Warm and dry. No edema.  Neuro: Alert and oriented X 3. Moves all extremities spontaneously. Gait is normal. CNII-XII grossly in tact. Psych:  Responds to questions appropriately with a normal affect.     ASSESSMENT AND PLAN:  74 y.o. year old male with  1. Hypertension Blood pressure is at goal. Continue current medications. Check labs monitor. - COMPLETE METABOLIC PANEL WITH GFR   2. Hyperlipidemia . Due to recheck now. - COMPLETE METABOLIC PANEL WITH GFR - Lipid Panel   3.  Vitamin D deficiency Last  vitamin D was normal 06/14/13.  4. Smoker unmotivated to quit 04/2013--He was motivated to quit finally- Given recent DOT physical info and fact that his wife, a heavy smoker, had lung cancer (has passed away 18-Oct-2013).  In past, his PAD was not motivation enough.  Because of wife's death and "his nerves" he did start back smoking but is motivated to quit--again. At [place] 01/2015 was smoking about one half pack per day but was agreeable to use Chantix again. At [place] 01/2015 a printed him  to suffer prescriptions. One for the starting dose pack and one for the continuing Dosepaks with several refills on that one.   5. Peripheral vascular disease Managed by Dr. Gwenlyn Found - COMPLETE METABOLIC PANEL WITH GFR - Lipid Panel - Hemoglobin A1c   6. Paroxysmal atrial fibrillation Managed by Dr. Gwenlyn Found  7. Elevated PSA History of negative biopsy. Sees urology annually.--Every February   8. COPD (chronic obstructive pulmonary disease) Continue  Symbicort. Continue the smoking cessation.   9. Screening for colorectal cancer had colonoscopy  November 2009. It showed polyp. Repeat 5 years. Had Colonoscopy November 2015--3 polyps--f/u 5 years.    10. Hyperglycemia - Hemoglobin A1c   11. History of negative Cardiolite.  12. DEXA scan August 2009 normal  13. Immunizations: Influenza: He did receive this fall/2014 here --Given here 06/11/2014 Pneumovax was given here October 2012. Gave  Prevnar 13--given here  06/14/2013. Tetanus vaccine:  T dapGiven here 11/2013 Zostavax: At prior OV: Told him to check with insurance regarding cost and let us know if wants RX sent t receive this.   At [place] 01/10/2015: Says that he forgot to check on the cost but says that he has Medicare and a supplemental insurance. As even if they do not cover he is agreeable to pay the $200 to prevent shingles.                                       Says to go ahead and send prescription to his pharmacy so I had  Margreta Journey do so today.  ROV 6 months or sooner as needed.  Marin Olp [place], [place], [facility] 01/10/2015 7:59 AM

## 2015-01-16 ENCOUNTER — Other Ambulatory Visit: Payer: Self-pay | Admitting: Family Medicine

## 2015-01-16 MED ORDER — ATORVASTATIN CALCIUM 80 MG PO TABS: ORAL_TABLET | ORAL | Status: DC

## 2015-01-16 MED ORDER — CLONIDINE HCL 0.1 MG PO TABS
0.1000 mg | ORAL_TABLET | Freq: Two times a day (BID) | ORAL | Status: DC
Start: 2015-01-16 — End: 2015-06-05

## 2015-01-16 MED ORDER — HYDROCHLOROTHIAZIDE 25 MG PO TABS: ORAL_TABLET | ORAL | Status: DC

## 2015-01-16 NOTE — Telephone Encounter (Signed)
Medication refilled per protocol. 

## 2015-01-21 ENCOUNTER — Other Ambulatory Visit: Payer: Self-pay | Admitting: Family Medicine

## 2015-01-21 MED ORDER — BENAZEPRIL HCL 40 MG PO TABS: 40.0000 mg | ORAL_TABLET | Freq: Every day | ORAL | Status: DC

## 2015-01-21 NOTE — Telephone Encounter (Signed)
Medication refilled per protocol. 

## 2015-02-04 DIAGNOSIS — R972 Elevated prostate specific antigen [PSA]: Secondary | ICD-10-CM | POA: Diagnosis not present

## 2015-02-08 DIAGNOSIS — N401 Enlarged prostate with lower urinary tract symptoms: Secondary | ICD-10-CM | POA: Diagnosis not present

## 2015-02-08 DIAGNOSIS — R3912 Poor urinary stream: Secondary | ICD-10-CM | POA: Diagnosis not present

## 2015-02-08 DIAGNOSIS — N138 Other obstructive and reflux uropathy: Secondary | ICD-10-CM | POA: Diagnosis not present

## 2015-02-08 DIAGNOSIS — R972 Elevated prostate specific antigen [PSA]: Secondary | ICD-10-CM | POA: Diagnosis not present

## 2015-03-13 DIAGNOSIS — H25813 Combined forms of age-related cataract, bilateral: Secondary | ICD-10-CM | POA: Diagnosis not present

## 2015-03-13 DIAGNOSIS — H31093 Other chorioretinal scars, bilateral: Secondary | ICD-10-CM | POA: Diagnosis not present

## 2015-03-21 DIAGNOSIS — H2512 Age-related nuclear cataract, left eye: Secondary | ICD-10-CM | POA: Diagnosis not present

## 2015-03-21 DIAGNOSIS — H268 Other specified cataract: Secondary | ICD-10-CM | POA: Diagnosis not present

## 2015-04-04 ENCOUNTER — Other Ambulatory Visit: Payer: Self-pay | Admitting: Family Medicine

## 2015-04-04 MED ORDER — PROPAFENONE HCL 225 MG PO TABS: 225.0000 mg | ORAL_TABLET | Freq: Two times a day (BID) | ORAL | Status: DC

## 2015-04-04 NOTE — Telephone Encounter (Signed)
Medication refilled per protocol. 

## 2015-04-15 ENCOUNTER — Other Ambulatory Visit: Payer: Self-pay | Admitting: Family Medicine

## 2015-04-15 DIAGNOSIS — H2511 Age-related nuclear cataract, right eye: Secondary | ICD-10-CM | POA: Diagnosis not present

## 2015-04-15 MED ORDER — BUDESONIDE-FORMOTEROL FUMARATE 160-4.5 MCG/ACT IN AERO: 2.0000 | INHALATION_SPRAY | Freq: Two times a day (BID) | RESPIRATORY_TRACT | Status: DC

## 2015-04-15 NOTE — Telephone Encounter (Signed)
Medication refilled per protocol. 

## 2015-04-18 DIAGNOSIS — H268 Other specified cataract: Secondary | ICD-10-CM | POA: Diagnosis not present

## 2015-04-18 DIAGNOSIS — H2511 Age-related nuclear cataract, right eye: Secondary | ICD-10-CM | POA: Diagnosis not present

## 2015-06-05 ENCOUNTER — Encounter: Payer: Self-pay | Admitting: Physician Assistant

## 2015-06-05 ENCOUNTER — Ambulatory Visit (INDEPENDENT_AMBULATORY_CARE_PROVIDER_SITE_OTHER): Payer: Medicare Other | Admitting: Physician Assistant

## 2015-06-05 ENCOUNTER — Telehealth: Payer: Self-pay | Admitting: Family Medicine

## 2015-06-05 VITALS — BP 116/76 | HR 76 | Temp 97.4°F | Resp 18 | Wt 194.0 lb

## 2015-06-05 DIAGNOSIS — I739 Peripheral vascular disease, unspecified: Secondary | ICD-10-CM | POA: Diagnosis not present

## 2015-06-05 DIAGNOSIS — E559 Vitamin D deficiency, unspecified: Secondary | ICD-10-CM | POA: Diagnosis not present

## 2015-06-05 DIAGNOSIS — Z1212 Encounter for screening for malignant neoplasm of rectum: Secondary | ICD-10-CM | POA: Diagnosis not present

## 2015-06-05 DIAGNOSIS — I480 Paroxysmal atrial fibrillation: Secondary | ICD-10-CM

## 2015-06-05 DIAGNOSIS — Z23 Encounter for immunization: Secondary | ICD-10-CM

## 2015-06-05 DIAGNOSIS — Z720 Tobacco use: Secondary | ICD-10-CM | POA: Diagnosis not present

## 2015-06-05 DIAGNOSIS — J439 Emphysema, unspecified: Secondary | ICD-10-CM | POA: Diagnosis not present

## 2015-06-05 DIAGNOSIS — R739 Hyperglycemia, unspecified: Secondary | ICD-10-CM

## 2015-06-05 DIAGNOSIS — R7309 Other abnormal glucose: Secondary | ICD-10-CM | POA: Diagnosis not present

## 2015-06-05 DIAGNOSIS — Z1211 Encounter for screening for malignant neoplasm of colon: Secondary | ICD-10-CM | POA: Diagnosis not present

## 2015-06-05 DIAGNOSIS — R972 Elevated prostate specific antigen [PSA]: Secondary | ICD-10-CM | POA: Diagnosis not present

## 2015-06-05 DIAGNOSIS — E785 Hyperlipidemia, unspecified: Secondary | ICD-10-CM

## 2015-06-05 DIAGNOSIS — I10 Essential (primary) hypertension: Secondary | ICD-10-CM

## 2015-06-05 DIAGNOSIS — F17210 Nicotine dependence, cigarettes, uncomplicated: Secondary | ICD-10-CM

## 2015-06-05 LAB — COMPLETE METABOLIC PANEL WITH GFR
ALT: 14 U/L (ref 9–46)
AST: 12 U/L (ref 10–35)
Albumin: 3.9 g/dL (ref 3.6–5.1)
Alkaline Phosphatase: 60 U/L (ref 40–115)
BILIRUBIN TOTAL: 0.7 mg/dL (ref 0.2–1.2)
BUN: 23 mg/dL (ref 7–25)
CHLORIDE: 99 mmol/L (ref 98–110)
CO2: 26 mmol/L (ref 20–31)
Calcium: 9.1 mg/dL (ref 8.6–10.3)
Creat: 1.25 mg/dL — ABNORMAL HIGH (ref 0.70–1.18)
GFR, Est African American: 66 mL/min (ref >=60)
GFR, Est Non African American: 57 mL/min — ABNORMAL LOW (ref >=60)
Glucose, Bld: 87 mg/dL (ref 70–99)
Potassium: 4.5 mmol/L (ref 3.5–5.3)
Sodium: 140 mmol/L (ref 135–146)
TOTAL PROTEIN: 6.4 g/dL (ref 6.1–8.1)

## 2015-06-05 LAB — LIPID PANEL
Cholesterol: 130 mg/dL (ref 125–200)
HDL: 38 mg/dL — ABNORMAL LOW (ref >=40)
LDL Cholesterol: 67 mg/dL (ref <130)
TRIGLYCERIDES: 125 mg/dL (ref <150)
Total CHOL/HDL Ratio: 3.4 Ratio (ref <=5.0)
VLDL: 25 mg/dL (ref <30)

## 2015-06-05 LAB — HEMOGLOBIN A1C
Hgb A1c MFr Bld: 6.2 % — ABNORMAL HIGH (ref <5.7)
Mean Plasma Glucose: 131 mg/dL — ABNORMAL HIGH (ref <117)

## 2015-06-05 MED ORDER — FLUTICASONE-SALMETEROL 250-50 MCG/DOSE IN AEPB: 1.0000 | INHALATION_SPRAY | Freq: Two times a day (BID) | RESPIRATORY_TRACT | Status: DC

## 2015-06-05 MED ORDER — FLUTICASONE-SALMETEROL 500-50 MCG/DOSE IN AEPB: 1.0000 | INHALATION_SPRAY | Freq: Every day | RESPIRATORY_TRACT | Status: DC

## 2015-06-05 NOTE — Progress Notes (Signed)
Patient ID: Antonio Wise MRN: [id number], DOB: 05-Dec-1940, 74 y.o. Date of Encounter: @DATE @  Chief Complaint:  Chief Complaint  Patient presents with  . 6 mth check up    is fasting  . Flu Vaccine    HPI: 74 y.o. year old white male  presents for routine followup.  His wife died from cancer 2014/02/06.  At office visit 01/2015 we discussed the fact that one of his daughters lives with him and another daughter lives across the street from him. Says that they make sure he is "staying out of trouble and behaving himself ". They make sure that he is eating etc. Says he did start smoking --but is cutting back down--currently about 1/2 ppd. At office visit 01/2015 I discussed the fact that I thought he had used Chantix in the past and it worked for him. He says that it did work well for him and did not cause him any side effects. He is agreeable to use Chantix again and wants me to print him prescriptions today. At [place] 06/05/2015--he states that he has just about quit smoking completely. Says that he rarely smokes a cigarette here and there. Says that he has off of the Chantix now.  At [place] 11/2013 he reported that since starting the Symbicort he does notice a difference. He is using it routinely as directed. At [place] 01/2015 discussed that it is expensive. I did give him 2 samples at that visit. At [place] 06/05/2015 he says that he recently got information from his insurance that they are not going to cover Symbicort for the upcoming year.  They will cover Advair.  He had a OV with me on 04/06/13 for evaluation of COPD. He had gone in for DOT physical and they had done PFTs which revealed severe COPD. He was told to followup with PCP for treatment. At that visit I prescribed Chantix and also prescribed Symbicort.  Considered adding Spiriva but cost is going to be an issue. At visit 01/2015 I gave him 2 sample boxes of Symbicort. At visit 06/05/15 he reported that insurance is not going to be covering  Symbicort for the upcoming year. They will cover Advair.  He is taking blood pressure medications as directed. He is taking cholesterol medication as directed. No myalgias or other adverse effects.  Says that he now sees Dr. Gwenlyn Found just once a year.   Also still sees Urology once a year--has appt there February 2016.   No complaints today.  Past Medical History  Diagnosis Date  . Hypertension   . Hyperlipidemia   . Vitamin D deficiency   . Smoker unmotivated to quit   . Peripheral vascular disease ([place])   . Paroxysmal atrial fibrillation ([facility])   . Elevated PSA   . COPD (chronic obstructive pulmonary disease) ([place])   . H/O Helicobacter infection 05/06/2008    EGD  . Gastric ulcer 05/06/2008    EGD  . Duodenal ulcer 05/06/2008    EGD  . Colon polyp 05/06/2008    repeat 5 years  . History of nuclear stress test 02/19/2010    bruce protocol; mild-mod perfusion defect in spetal region consistent with attenuation artifact; no significant ischemia demonstrated; dysrhythmia during exercise     Home Meds:  Outpatient Prescriptions Prior to Visit  Medication Sig Dispense Refill  . amLODipine (NORVASC) 5 MG tablet TAKE 1 TABLET (5 MG TOTAL) BY MOUTH DAILY. 90 tablet 3  . aspirin 81 MG tablet Take 81 mg  by mouth daily.    Marland Kitchen atorvastatin (LIPITOR) 80 MG tablet TAKE 1 TABLET (80 MG TOTAL) BY MOUTH DAILY. 90 tablet 1  . benazepril (LOTENSIN) 40 MG tablet Take 1 tablet (40 mg total) by mouth daily. 90 tablet 1  . budesonide-formoterol (SYMBICORT) 160-4.5 MCG/ACT inhaler Inhale 2 puffs into the lungs 2 (two) times daily. 10.2 g 5  . hydrochlorothiazide (HYDRODIURIL) 25 MG tablet TAKE 1 TABLET (25 MG TOTAL) BY MOUTH DAILY. 90 tablet 1  . Multiple Minerals-Vitamins (CALCIUM & VIT D3 BONE HEALTH PO) Take 1 tablet by mouth daily.    . propafenone (RYTHMOL) 225 MG tablet Take 1 tablet (225 mg total) by mouth 2 (two) times daily. 180 tablet 1  . cloNIDine (CATAPRES) 0.1 MG tablet Take 1 tablet (0.1 mg  total) by mouth 2 (two) times daily. 180 tablet 1  . varenicline (CHANTIX CONTINUING MONTH PAK) 1 MG tablet Take 1 tablet (1 mg total) by mouth 2 (two) times daily. (Patient not taking: Reported on 06/05/2015) 60 tablet 3  . varenicline (CHANTIX) 0.5 MG tablet Take 1 tablet (0.5 mg total) by mouth 2 (two) times daily. (Patient not taking: Reported on 06/05/2015) 40 tablet 0  . zoster vaccine live, PF, (ZOSTAVAX) 09811 UNT/0.65ML injection Inject 19,400 Units into the skin once. 1 each 0   No facility-administered medications prior to visit.     Allergies:  Allergies  Allergen Reactions  . Codeine     Social History   Social History  . Marital Status: Married    Spouse Name: N/A  . Number of Children: 3  . Years of Education: N/A   Occupational History  .  [employer]   Social History Main Topics  . Smoking status: Current Some Day Smoker -- 0.20 packs/day    Types: Cigarettes    Last Attempt to Quit: 05/17/2013  . Smokeless tobacco: Never Used  . Alcohol Use: No  . Drug Use: No  . Sexual Activity: Not on file   Other Topics Concern  . Not on file   Social History Narrative   Entered 06/2014:   His wife was also a patient of mine ([facility])   She passed away in 2015--secondary to ongoing smoker, lung cancer.    At [place] 06/2104 pt reports that one of their kids was already living with them--so he is not alone, even now .    Family History  Problem Relation Age of Onset  . Heart disease Mother   . COPD Father   . Cancer Sister 36    Breast Cancer  . Diabetes Brother   . Cancer Father      Review of Systems:  See HPI for pertinent ROS. All other ROS negative.    Physical Exam: Blood pressure 116/76, pulse 76, temperature 97.4 F (36.3 C), temperature source Oral, resp. rate 18, weight 194 lb (87.998 kg)., Body mass index is 25.6 kg/(m^2). General: WNWD WM. Appears in no acute distress. Neck: Supple. No thyromegaly. No lymphadenopathy. No carotid  bruits. Lungs: Breath sounds are decreased and distant. However there are no active wheezes. Heart: RRR with S1 S2. No murmurs, rubs, or gallops. Abdomen: Soft, non-tender, non-distended with normoactive bowel sounds. No hepatomegaly. No rebound/guarding. No obvious abdominal masses. Musculoskeletal:  Strength and tone normal for age. Extremities/Skin: Warm and dry. No edema.  Neuro: Alert and oriented X 3. Moves all extremities spontaneously. Gait is normal. CNII-XII grossly in tact. Psych:  Responds to questions appropriately with a normal affect.  ASSESSMENT AND PLAN:  74 y.o. year old male with  1. Hypertension Blood pressure is at goal. Continue current medications. Check labs monitor. - COMPLETE METABOLIC PANEL WITH GFR   2. Hyperlipidemia . Due to recheck now. - COMPLETE METABOLIC PANEL WITH GFR - Lipid Panel   3. Vitamin D deficiency Last  vitamin D was normal 06/14/13.  4. Smoker --has almost completely quit 04/2013--He was motivated to quit finally- Given recent DOT physical info and fact that his wife, a heavy smoker, had lung cancer (has passed away 10/25/13).  In past, his PAD was not motivation enough.  Because of wife's death and "his nerves" he did start back smoking but is motivated to quit--again. At [place] 01/2015 was smoking about one half pack per day but was agreeable to use Chantix again. At [place] 01/2015 a printed him to suffer prescriptions. One for the starting dose pack and one for the continuing Dosepaks with several refills on that one.  At [place] 05/2015 I discussed with him whether he feels that he needs to get back on the Chantix to make sure he does not start back to routine smoking. He does not feel that he needs the Chantix but I told him to call me if he finds himself smoking more.  5. Peripheral vascular disease Managed by Dr. Gwenlyn Found - COMPLETE METABOLIC PANEL WITH GFR - Lipid Panel - Hemoglobin A1c   6. Paroxysmal atrial fibrillation Managed by Dr.  Gwenlyn Found  7. Elevated PSA History of negative biopsy. Sees urology annually.--Every February   8. COPD (chronic obstructive pulmonary disease) 06/05/15 changed Symbicort to Advair secondary to insurance coverage. Continue the smoking cessation.   9. Screening for colorectal cancer had colonoscopy  November 2009. It showed polyp. Repeat 5 years. Had Colonoscopy November 2015--3 polyps--f/u 5 years.    10. Hyperglycemia - Hemoglobin A1c   11. History of negative Cardiolite.  12. DEXA scan August 2009 normal  13. Immunizations: Influenza: He did receive this fall/2014 here --Given here 06/11/2014. Given here 06/05/2015 Pneumovax was given here October 2012. Gave  Prevnar 13--given here  06/14/2013. Tetanus vaccine:  T dapGiven here 11/2013 Zostavax: At prior OV: Told him to check with insurance regarding cost and let us know if wants RX sent t receive this.   At [place] 01/10/2015: Says that he forgot to check on the cost but says that he has Medicare and a supplemental insurance. As even if they do not cover he is agreeable to pay the $200 to prevent shingles.                                       Says to go ahead and send prescription to his pharmacy so I had Margreta Journey do so today.  At [place] 06/05/15--he reports that he did go to the pharmacy and received this vaccine  ROV 6 months or sooner as needed.  [address] [place], [place], [facility] 06/05/2015 9:38 AM

## 2015-06-05 NOTE — Telephone Encounter (Signed)
PA requested for Advair [place].  Submitted through [place].  Approval rec'd for Advair from 06/05/15 - 06/04/16.

## 2015-06-14 ENCOUNTER — Other Ambulatory Visit: Payer: Self-pay | Admitting: Family Medicine

## 2015-06-14 MED ORDER — ATORVASTATIN CALCIUM 80 MG PO TABS: ORAL_TABLET | ORAL | Status: DC

## 2015-06-14 MED ORDER — HYDROCHLOROTHIAZIDE 25 MG PO TABS: ORAL_TABLET | ORAL | Status: DC

## 2015-06-14 MED ORDER — CLONIDINE HCL 0.1 MG PO TABS: 0.1000 mg | ORAL_TABLET | Freq: Two times a day (BID) | ORAL | Status: DC

## 2015-06-14 NOTE — Telephone Encounter (Signed)
Medication refilled per protocol. 

## 2015-07-05 ENCOUNTER — Other Ambulatory Visit: Payer: Self-pay | Admitting: Family Medicine

## 2015-07-05 MED ORDER — PROPAFENONE HCL 225 MG PO TABS: 225.0000 mg | ORAL_TABLET | Freq: Two times a day (BID) | ORAL | Status: DC

## 2015-07-05 NOTE — Telephone Encounter (Signed)
Medication refilled per protocol. 

## 2015-07-16 ENCOUNTER — Other Ambulatory Visit: Payer: Self-pay | Admitting: Family Medicine

## 2015-07-16 MED ORDER — AMLODIPINE BESYLATE 5 MG PO TABS: ORAL_TABLET | ORAL | Status: DC

## 2015-07-16 NOTE — Telephone Encounter (Signed)
Medication refilled per protocol. 

## 2015-07-17 ENCOUNTER — Other Ambulatory Visit: Payer: Self-pay | Admitting: Family Medicine

## 2015-07-17 MED ORDER — AMLODIPINE BESYLATE 5 MG PO TABS: ORAL_TABLET | ORAL | Status: DC

## 2015-07-17 NOTE — Telephone Encounter (Signed)
Medication refilled per protocol. 

## 2015-10-10 ENCOUNTER — Other Ambulatory Visit: Payer: Self-pay | Admitting: Physician Assistant

## 2015-10-11 ENCOUNTER — Ambulatory Visit (INDEPENDENT_AMBULATORY_CARE_PROVIDER_SITE_OTHER): Payer: Medicare Other | Admitting: Family Medicine

## 2015-10-11 ENCOUNTER — Encounter: Payer: Self-pay | Admitting: Family Medicine

## 2015-10-11 VITALS — BP 136/74 | HR 94 | Temp 98.1°F | Resp 20 | Wt 205.0 lb

## 2015-10-11 DIAGNOSIS — M545 Low back pain: Secondary | ICD-10-CM

## 2015-10-11 DIAGNOSIS — R509 Fever, unspecified: Secondary | ICD-10-CM

## 2015-10-11 LAB — URINALYSIS, ROUTINE W REFLEX MICROSCOPIC
BILIRUBIN URINE: NEGATIVE
Glucose, UA: NEGATIVE
Hgb urine dipstick: NEGATIVE
KETONES UR: NEGATIVE
Leukocytes, UA: NEGATIVE
NITRITE: NEGATIVE
SPECIFIC GRAVITY, URINE: 1.02 (ref 1.001–1.035)
pH: 5.5 (ref 5.0–8.0)

## 2015-10-11 MED ORDER — CIPROFLOXACIN HCL 500 MG PO TABS: 500.0000 mg | ORAL_TABLET | Freq: Two times a day (BID) | ORAL | Status: DC

## 2015-10-11 NOTE — Progress Notes (Signed)
Subjective:    Patient ID: Antonio Wise, male    DOB: 1940-08-24, 75 y.o.   MRN: [id number]  HPI Patient report one-week of pain in his right back/right flank. The pain is steady. He is also reported intermittent fevers he states that the week prior he developed increased frequency however he was also experiencing hesitancy and dribbling of urine. He denies any hematuria. He denies any severe dysuria. However the frequency and hesitancy has now led to right-sided back pain. The pain is not exacerbated by movement. He is able to bend over and twist without any pain. He does not have any CVA tenderness today. He denies any injury to his back. He denies any nausea or vomiting. He denies any constipation or diarrhea. He denies any blood in his stool. Past Medical History  Diagnosis Date  . Hypertension   . Hyperlipidemia   . Vitamin D deficiency   . Smoker unmotivated to quit   . Peripheral vascular disease ([place])   . Paroxysmal atrial fibrillation (HCC)   . Elevated PSA   . COPD (chronic obstructive pulmonary disease) ([place])   . H/O Helicobacter infection 05/06/2008    EGD  . Gastric ulcer 05/06/2008    EGD  . Duodenal ulcer 05/06/2008    EGD  . Colon polyp 05/06/2008    repeat 5 years  . History of nuclear stress test 02/19/2010    bruce protocol; mild-mod perfusion defect in spetal region consistent with attenuation artifact; no significant ischemia demonstrated; dysrhythmia during exercise   Past Surgical History  Procedure Laterality Date  . Iliac vein angioplasty / stenting  05/19/2005    left CIA PTA & stenting   . Transthoracic echocardiogram  06/10/2005    LV hyperdynamic; borderline LA enlargement; trace MR/TR/AVR   Current Outpatient Prescriptions on File Prior to Visit  Medication Sig Dispense Refill  . amLODipine (NORVASC) 5 MG tablet TAKE 1 TABLET (5 MG TOTAL) BY MOUTH DAILY. 90 tablet 1  . aspirin 81 MG tablet Take 81 mg by mouth daily.    Marland Kitchen atorvastatin (LIPITOR) 80 MG  tablet TAKE 1 TABLET (80 MG TOTAL) BY MOUTH DAILY. 90 tablet 1  . benazepril (LOTENSIN) 40 MG tablet Take 1 tablet (40 mg total) by mouth daily. 90 tablet 1  . cloNIDine (CATAPRES) 0.1 MG tablet Take 1 tablet (0.1 mg total) by mouth 2 (two) times daily. 180 tablet 1  . hydrochlorothiazide (HYDRODIURIL) 25 MG tablet TAKE 1 TABLET BY MOUTH DAILY 90 tablet 1  . Multiple Minerals-Vitamins (CALCIUM & VIT D3 BONE HEALTH PO) Take 1 tablet by mouth daily.    . propafenone (RYTHMOL) 225 MG tablet Take 1 tablet (225 mg total) by mouth 2 (two) times daily. 180 tablet 1   No current facility-administered medications on file prior to visit.   Allergies  Allergen Reactions  . Codeine    Social History   Social History  . Marital Status: Married    Spouse Name: N/A  . Number of Children: 3  . Years of Education: N/A   Occupational History  .  [employer]   Social History Main Topics  . Smoking status: Current Some Day Smoker -- 0.20 packs/day    Types: Cigarettes    Last Attempt to Quit: 05/17/2013  . Smokeless tobacco: Never Used  . Alcohol Use: No  . Drug Use: No  . Sexual Activity: Not on file   Other Topics Concern  . Not on file   Social History Narrative  Entered 06/2014:   His wife was also a patient of mine ([facility])   She passed away in 2015--secondary to ongoing smoker, lung cancer.    At [place] 06/2104 pt reports that one of their kids was already living with them--so he is not alone, even now .     Review of Systems  All other systems reviewed and are negative.      Objective:   Physical Exam  Constitutional: He appears well-developed and well-nourished.  Cardiovascular: Normal rate, regular rhythm and normal heart sounds.   Pulmonary/Chest: Effort normal. He has wheezes.  Abdominal: Soft. Bowel sounds are normal. He exhibits no distension and no mass. There is no tenderness. There is no rebound and no guarding.  Vitals reviewed.         Assessment & Plan:    Other specified fever - Plan: Urinalysis, Routine w reflex microscopic (not at [facility])  Low back pain without sciatica, unspecified back pain laterality - Plan: Urinalysis, Routine w reflex microscopic (not at [facility])  Urinalysis only shows trace protein. There is no evidence of a kidney infection. It is possible the patient may be experiencing prostatitis as he has been having hesitancy urgency and frequency and now some low back pain. I'm going to treat the patient empirically for prostatitis with Cipro 500 mg by mouth twice a day for 10 days.

## 2015-10-11 NOTE — Telephone Encounter (Signed)
Medication refilled per protocol. 

## 2015-10-21 ENCOUNTER — Ambulatory Visit (INDEPENDENT_AMBULATORY_CARE_PROVIDER_SITE_OTHER): Payer: Medicare Other | Admitting: Family Medicine

## 2015-10-21 ENCOUNTER — Encounter: Payer: Self-pay | Admitting: Family Medicine

## 2015-10-21 ENCOUNTER — Ambulatory Visit: Payer: No Typology Code available for payment source | Admitting: Family Medicine

## 2015-10-21 VITALS — BP 142/64 | HR 100 | Temp 98.8°F | Resp 20 | Wt 202.0 lb

## 2015-10-21 DIAGNOSIS — R509 Fever, unspecified: Secondary | ICD-10-CM

## 2015-10-21 DIAGNOSIS — R109 Unspecified abdominal pain: Secondary | ICD-10-CM

## 2015-10-21 NOTE — Progress Notes (Signed)
Subjective:    Patient ID: Antonio Wise, male    DOB: [birth year], 75 y.o.   MRN: [id number]  HPI 10/11/15 Patient report one-week of pain in his right back/right flank. The pain is steady. He is also reported intermittent fevers he states that the week prior he developed increased frequency however he was also experiencing hesitancy and dribbling of urine. He denies any hematuria. He denies any severe dysuria. However the frequency and hesitancy has now led to right-sided back pain. The pain is not exacerbated by movement. He is able to bend over and twist without any pain. He does not have any CVA tenderness today. He denies any injury to his back. He denies any nausea or vomiting. He denies any constipation or diarrhea. He denies any blood in his stool.  At that time, my plan was: Urinalysis only shows trace protein. There is no evidence of a kidney infection. It is possible the patient may be experiencing prostatitis as he has been having hesitancy urgency and frequency and now some low back pain. I'm going to treat the patient empirically for prostatitis with Cipro 500 mg by mouth twice a day for 10 days.    10/21/15 Patient has completed 10 days of Cipro. The pain in his right flank has never improved it is not severe pain.Marland Kitchen He states that it is 3-4 on a scale of 10. However it is constant. It has no relationship to movement, bending of the spine, or twisting of the spine. There are no exacerbating or alleviating factors. Furthermore he has had daily fever greater than 100 every afternoon at the same time for the last 15 days. Patient is a smoker. He also reports cough and chest congestion. He denies any nausea vomiting diarrhea. He denies any dysuria hesitancy or urgency. These symptoms have improved after the Cipro. he denies any blood in his urine. He denies any blood in his stool. He denies any black tarry stool. He has lost 3 labs since last week.    Past Medical History  Diagnosis Date  .  Hypertension   . Hyperlipidemia   . Vitamin D deficiency   . Smoker unmotivated to quit   . Peripheral vascular disease ([place])   . Paroxysmal atrial fibrillation (HCC)   . Elevated PSA   . COPD (chronic obstructive pulmonary disease) ([place])   . H/O Helicobacter infection 05/06/2008    EGD  . Gastric ulcer 05/06/2008    EGD  . Duodenal ulcer 05/06/2008    EGD  . Colon polyp 05/06/2008    repeat 5 years  . History of nuclear stress test 02/19/2010    bruce protocol; mild-mod perfusion defect in spetal region consistent with attenuation artifact; no significant ischemia demonstrated; dysrhythmia during exercise   Past Surgical History  Procedure Laterality Date  . Iliac vein angioplasty / stenting  05/19/2005    left CIA PTA & stenting   . Transthoracic echocardiogram  06/10/2005    LV hyperdynamic; borderline LA enlargement; trace MR/TR/AVR   Current Outpatient Prescriptions on File Prior to Visit  Medication Sig Dispense Refill  . ADVAIR DISKUS 500-50 MCG/DOSE AEPB INL 1 PUFF PO D  10  . amLODipine (NORVASC) 5 MG tablet TAKE 1 TABLET (5 MG TOTAL) BY MOUTH DAILY. 90 tablet 1  . aspirin 81 MG tablet Take 81 mg by mouth daily.    Marland Kitchen atorvastatin (LIPITOR) 80 MG tablet TAKE 1 TABLET (80 MG TOTAL) BY MOUTH DAILY. 90 tablet 1  . benazepril (LOTENSIN)  40 MG tablet Take 1 tablet (40 mg total) by mouth daily. 90 tablet 1  . ciprofloxacin (CIPRO) 500 MG tablet Take 1 tablet (500 mg total) by mouth 2 (two) times daily. 20 tablet 0  . cloNIDine (CATAPRES) 0.1 MG tablet Take 1 tablet (0.1 mg total) by mouth 2 (two) times daily. 180 tablet 1  . hydrochlorothiazide (HYDRODIURIL) 25 MG tablet TAKE 1 TABLET BY MOUTH DAILY 90 tablet 1  . Multiple Minerals-Vitamins (CALCIUM & VIT D3 BONE HEALTH PO) Take 1 tablet by mouth daily.    . propafenone (RYTHMOL) 225 MG tablet Take 1 tablet (225 mg total) by mouth 2 (two) times daily. 180 tablet 1   No current facility-administered medications on file prior to  visit.   Allergies  Allergen Reactions  . Codeine    Social History   Social History  . Marital Status: Married    Spouse Name: N/A  . Number of Children: 3  . Years of Education: N/A   Occupational History  .  [employer]   Social History Main Topics  . Smoking status: Current Some Day Smoker -- 0.20 packs/day    Types: Cigarettes    Last Attempt to Quit: 05/17/2013  . Smokeless tobacco: Never Used  . Alcohol Use: No  . Drug Use: No  . Sexual Activity: Not on file   Other Topics Concern  . Not on file   Social History Narrative   Entered 06/2014:   His wife was also a patient of mine ([facility])   She passed away in 2015--secondary to ongoing smoker, lung cancer.    At [place] 06/2104 pt reports that one of their kids was already living with them--so he is not alone, even now .     Review of Systems  All other systems reviewed and are negative.      Objective:   Physical Exam  Constitutional: He appears well-developed and well-nourished.  Cardiovascular: Normal rate, regular rhythm and normal heart sounds.  Exam reveals no gallop and no friction rub.   No murmur heard. Pulmonary/Chest: Effort normal. He has wheezes. He has no rales.  Abdominal: Soft. Bowel sounds are normal. He exhibits no distension and no mass. There is no tenderness. There is no rebound and no guarding.  Musculoskeletal:       Thoracic back: He exhibits pain. He exhibits normal range of motion, no tenderness and no spasm.       Lumbar back: He exhibits pain. He exhibits normal range of motion, no tenderness, no bony tenderness, no swelling, no deformity and no spasm.  Vitals reviewed.         Assessment & Plan:  FUO (fever of unknown origin) - Plan: CBC with Differential/Platelet, COMPLETE METABOLIC PANEL WITH GFR, B. burgdorfi antibodies by WB, CT Abdomen Pelvis W Contrast  Right flank pain - Plan: CT Abdomen Pelvis W Contrast Patient has had fever of unknown origin for 15 straight days  with persistent right flank pain. Given his age, his smoking history, I am worried about possible malignancy in the right flank such as renal cell carcinoma versus liver malignancy versus right lower lobe pulmonary malignancy. I believe a CT scan of the abdomen and pelvis is the most efficient way to evaluate for all these possibilities. I will also obtain a CBC, CMP, and Lyme titers. I recommended discontinuing hydrochlorothiazide and drinking more fluid until he obtains the contrasted CT scan

## 2015-10-22 ENCOUNTER — Ambulatory Visit
Admission: RE | Admit: 2015-10-22 | Discharge: 2015-10-22 | Disposition: A | Discharge status: Home / Self Care | Payer: Medicare Other | Source: Ambulatory Visit | Attending: Family Medicine | Admitting: Family Medicine

## 2015-10-22 ENCOUNTER — Other Ambulatory Visit: Payer: Self-pay | Admitting: Family Medicine

## 2015-10-22 DIAGNOSIS — R509 Fever, unspecified: Secondary | ICD-10-CM

## 2015-10-22 DIAGNOSIS — R109 Unspecified abdominal pain: Secondary | ICD-10-CM

## 2015-10-22 DIAGNOSIS — K8020 Calculus of gallbladder without cholecystitis without obstruction: Secondary | ICD-10-CM | POA: Diagnosis not present

## 2015-10-22 LAB — CBC WITH DIFFERENTIAL/PLATELET
Basophils Absolute: 0 cells/uL (ref 0–200)
Basophils Relative: 0 %
EOS ABS: 346 {cells}/uL (ref 15–500)
Eosinophils Relative: 2 %
HEMATOCRIT: 41.3 % (ref 38.5–50.0)
Hemoglobin: 13.4 g/dL (ref 13.0–17.0)
LYMPHS PCT: 11 %
Lymphs Abs: 1903 cells/uL (ref 850–3900)
MCH: 30.2 pg (ref 27.0–33.0)
MCHC: 32.4 g/dL (ref 32.0–36.0)
MCV: 93 fL (ref 80.0–100.0)
MONO ABS: 1730 {cells}/uL — AB (ref 200–950)
MPV: 8.5 fL (ref 7.5–12.5)
Monocytes Relative: 10 %
NEUTROS PCT: 77 %
Neutro Abs: 13321 cells/uL — ABNORMAL HIGH (ref 1500–7800)
Platelets: 515 10*3/uL — ABNORMAL HIGH (ref 140–400)
RBC: 4.44 MIL/uL (ref 4.20–5.80)
RDW: 14 % (ref 11.0–15.0)
WBC: 17.3 10*3/uL — AB (ref 3.8–10.8)

## 2015-10-22 LAB — LYME ABY, WSTRN BLT IGG & IGM W/BANDS
B BURGDORFERI IGG ABS (IB): NEGATIVE
B BURGDORFERI IGM ABS (IB): NEGATIVE
LYME DISEASE 23 KD IGG: NONREACTIVE
LYME DISEASE 23 KD IGM: NONREACTIVE
LYME DISEASE 39 KD IGG: NONREACTIVE
LYME DISEASE 41 KD IGG: NONREACTIVE
LYME DISEASE 58 KD IGG: NONREACTIVE
LYME DISEASE 66 KD IGG: NONREACTIVE
Lyme Disease 18 kD IgG: NONREACTIVE
Lyme Disease 28 kD IgG: NONREACTIVE
Lyme Disease 30 kD IgG: NONREACTIVE
Lyme Disease 39 kD IgM: NONREACTIVE
Lyme Disease 41 kD IgM: NONREACTIVE
Lyme Disease 45 kD IgG: NONREACTIVE
Lyme Disease 93 kD IgG: NONREACTIVE

## 2015-10-22 LAB — COMPLETE METABOLIC PANEL WITH GFR
ALT: 17 U/L (ref 9–46)
AST: 15 U/L (ref 10–35)
Albumin: 3.6 g/dL (ref 3.6–5.1)
Alkaline Phosphatase: 55 U/L (ref 40–115)
BUN: 26 mg/dL — ABNORMAL HIGH (ref 7–25)
CALCIUM: 9.4 mg/dL (ref 8.6–10.3)
CHLORIDE: 101 mmol/L (ref 98–110)
CO2: 29 mmol/L (ref 20–31)
Creat: 1.73 mg/dL — ABNORMAL HIGH (ref 0.70–1.18)
GFR, EST AFRICAN AMERICAN: 44 mL/min — AB (ref >=60)
GFR, EST NON AFRICAN AMERICAN: 38 mL/min — AB (ref >=60)
Glucose, Bld: 114 mg/dL — ABNORMAL HIGH (ref 70–99)
POTASSIUM: 4.9 mmol/L (ref 3.5–5.3)
Sodium: 142 mmol/L (ref 135–146)
Total Bilirubin: 0.5 mg/dL (ref 0.2–1.2)
Total Protein: 6.6 g/dL (ref 6.1–8.1)

## 2015-10-23 ENCOUNTER — Other Ambulatory Visit: Payer: No Typology Code available for payment source

## 2015-10-24 DIAGNOSIS — R938 Abnormal findings on diagnostic imaging of other specified body structures: Secondary | ICD-10-CM | POA: Diagnosis not present

## 2015-11-01 DIAGNOSIS — R933 Abnormal findings on diagnostic imaging of other parts of digestive tract: Secondary | ICD-10-CM | POA: Diagnosis not present

## 2015-11-01 DIAGNOSIS — D123 Benign neoplasm of transverse colon: Secondary | ICD-10-CM | POA: Diagnosis not present

## 2015-11-01 DIAGNOSIS — D126 Benign neoplasm of colon, unspecified: Secondary | ICD-10-CM | POA: Diagnosis not present

## 2015-11-01 DIAGNOSIS — K6389 Other specified diseases of intestine: Secondary | ICD-10-CM | POA: Diagnosis not present

## 2015-11-07 ENCOUNTER — Encounter: Payer: Self-pay | Admitting: Cardiovascular Disease

## 2015-11-07 ENCOUNTER — Telehealth: Payer: Self-pay | Admitting: Cardiovascular Disease

## 2015-11-07 ENCOUNTER — Ambulatory Visit (INDEPENDENT_AMBULATORY_CARE_PROVIDER_SITE_OTHER): Payer: Medicare Other | Admitting: Cardiovascular Disease

## 2015-11-07 DIAGNOSIS — I10 Essential (primary) hypertension: Secondary | ICD-10-CM | POA: Diagnosis not present

## 2015-11-07 DIAGNOSIS — R0602 Shortness of breath: Secondary | ICD-10-CM

## 2015-11-07 MED ORDER — ATORVASTATIN CALCIUM 40 MG PO TABS: 40.0000 mg | ORAL_TABLET | Freq: Every day | ORAL | Status: DC

## 2015-11-07 MED ORDER — RIVAROXABAN 20 MG PO TABS: 20.0000 mg | ORAL_TABLET | Freq: Every day | ORAL | Status: DC

## 2015-11-07 MED ORDER — DILTIAZEM HCL ER COATED BEADS 180 MG PO CP24: 180.0000 mg | ORAL_CAPSULE | Freq: Every day | ORAL | Status: DC

## 2015-11-07 NOTE — Progress Notes (Signed)
11/07/2015 Antonio Wise   Jun 27, 1941  [id number]  Primary Physician [name], PA-C Primary Cardiologist: Lorretta Harp MD Renae Gloss   HPI:  The patient returns today for followup. I last saw him 03/13/14. He is a 75 year old, mildly overweight, married Caucasian male, father of three, grandfather to three grandchildren with a history of peripheral vascular occlusive disease status post left common iliac artery PTA and stenting by myself August 19, 2004, for claudication. His symptoms resolved after that, and his Dopplers normalized. His other problems include discontinued tobacco abuse, only smoking two cigarettes a week, which is excellent for him. He has treated hypertension, dyslipidemia, and paroxysmal atrial fibrillation maintaining sinus rhythm on Rythmol. He has not had any A fib to his knowledge since I last saw him.   Since I saw him a year and a half ago he's done well until several weeks ago when he developed right flank pain and persistent fever.He probably had an MRI of his abdomen the results of which are unavailable to me at the current time. He underwent colonoscopy and was found to have "an infection in his colon". Today in the office he is in A. Fib with RVR   Current Outpatient Prescriptions  Medication Sig Dispense Refill  . ADVAIR DISKUS 500-50 MCG/DOSE AEPB INL 1 PUFF PO D  10  . amLODipine (NORVASC) 5 MG tablet TAKE 1 TABLET (5 MG TOTAL) BY MOUTH DAILY. 90 tablet 1  . aspirin 81 MG tablet Take 81 mg by mouth daily.    Marland Kitchen atorvastatin (LIPITOR) 80 MG tablet TAKE 1 TABLET (80 MG TOTAL) BY MOUTH DAILY. 90 tablet 1  . benazepril (LOTENSIN) 40 MG tablet Take 1 tablet (40 mg total) by mouth daily. 90 tablet 1  . cloNIDine (CATAPRES) 0.1 MG tablet Take 1 tablet (0.1 mg total) by mouth 2 (two) times daily. 180 tablet 1  . hydrochlorothiazide (HYDRODIURIL) 25 MG tablet TAKE 1 TABLET BY MOUTH DAILY 90 tablet 1  . Multiple Minerals-Vitamins (CALCIUM &  VIT D3 BONE HEALTH PO) Take 1 tablet by mouth daily.    . propafenone (RYTHMOL) 225 MG tablet Take 1 tablet (225 mg total) by mouth 2 (two) times daily. 180 tablet 1   No current facility-administered medications for this visit.    Allergies  Allergen Reactions  . Codeine     Social History   Social History  . Marital Status: Married    Spouse Name: N/A  . Number of Children: 3  . Years of Education: N/A   Occupational History  .  [employer]   Social History Main Topics  . Smoking status: Current Some Day Smoker -- 0.20 packs/day    Types: Cigarettes    Last Attempt to Quit: 05/17/2013  . Smokeless tobacco: Never Used  . Alcohol Use: No  . Drug Use: No  . Sexual Activity: Not on file   Other Topics Concern  . Not on file   Social History Narrative   Entered 06/2014:   His wife was also a patient of mine ([facility])   She passed away in 2015--secondary to ongoing smoker, lung cancer.    At [place] 06/2104 pt reports that one of their kids was already living with them--so he is not alone, even now .     Review of Systems: General: negative for chills, fever, night sweats or weight changes.  Cardiovascular: negative for chest pain, dyspnea on exertion, edema, orthopnea, palpitations, paroxysmal nocturnal dyspnea or shortness of breath  Dermatological: negative for rash Respiratory: negative for cough or wheezing Urologic: negative for hematuria Abdominal: negative for nausea, vomiting, diarrhea, bright red blood per rectum, melena, or hematemesis Neurologic: negative for visual changes, syncope, or dizziness All other systems reviewed and are otherwise negative except as noted above.    Blood pressure 160/86, pulse 134, height 6\' 1"  (1.854 m), weight 200 lb (90.719 kg).  General appearance: alert and no distress Neck: no adenopathy, no carotid bruit, no JVD, supple, symmetrical, trachea midline and thyroid not enlarged, symmetric, no tenderness/mass/nodules Lungs:  clear to auscultation bilaterally Heart: irregularly irregular rhythm Extremities: extremities normal, atraumatic, no cyanosis or edema  EKG initial fibrillation with a ventricular response of 135 septal Q waves. I personally reviewed this EKG  ASSESSMENT AND PLAN:   Hypertension History of hypertension blood pressure measured at 160/86. He is on amlodipine, clonidine, hydrochlorothiazide and benazepril. Because his initial fibrillation with rapid ventricular response I'm going to change his amlodipine to Cardizem CD180.  Hyperlipidemia History of hyperlipidemia on statin therapy with recent lipid profile performed 06/05/15 revealed a total cholesterol 1:30, LDL 67 and HDL of 38.  Peripheral vascular disease History of peripheral arterial disease status post left common iliac artery PTA and stenting by myself 08/19/04 for claudication. Follow up Doppler's have revealed a widely patent.  Paroxysmal atrial fibrillation History of proximal atrial fibrillation not on oral anticoagulant He is on Rythmol. I saw him a year ago he was in sinus rhythm. He is complaining of increasing shortness of breath over the last several weeks. He is in A. Fib with RVR today. The CHA2DSVASC2 score is 3  . I'm going to change his amlodipine to Cardizem, and start a novel oral anticoagulant. I'm also going to the echocardiogram.       Lorretta Harp MD [facility], [facility] 11/07/2015 4:20 PM

## 2015-11-07 NOTE — Assessment & Plan Note (Signed)
History of proximal atrial fibrillation not on oral anticoagulant He is on Rythmol. I saw him a year ago he was in sinus rhythm. He is complaining of increasing shortness of breath over the last several weeks. He is in A. Fib with RVR today. The CHA2DSVASC2 score is 3  . I'm going to change his amlodipine to Cardizem, and start a novel oral anticoagulant. I'm also going to the echocardiogram.

## 2015-11-07 NOTE — Telephone Encounter (Signed)
11-07-15 Lvm to call and schedule echo/saf

## 2015-11-07 NOTE — Assessment & Plan Note (Signed)
History of hypertension blood pressure measured at 160/86. He is on amlodipine, clonidine, hydrochlorothiazide and benazepril. Because his initial fibrillation with rapid ventricular response I'm going to change his amlodipine to Cardizem CD180.

## 2015-11-07 NOTE — Patient Instructions (Addendum)
Medication Instructions:  Your physician has recommended you make the following change in your medication:  1) STOP Norvasc 2) STOP Aspirin  3) DECREASE Lipitor to 40 mg by mouth ONCE daily ( you can cut current tablet in half - next RX will be 40 mg tablets) 4) START Cardiziem CD 180mg  by mouth ONCE daily 5) START Xarelto 20 mg by mouth ONCE Daily  Labwork: Your physician recommends that you return for lab work in: 2 weeks (bmet) The lab can be found on the FIRST FLOOR of out building in [address]   Testing/Procedures: Your physician has requested that you have an echocardiogram. Echocardiography is a painless test that uses sound waves to create images of your heart. It provides your doctor with information about the size and shape of your heart and how well your heart's chambers and valves are working. This procedure takes approximately one hour. There are no restrictions for this procedure.    Follow-Up: Your physician recommends that you schedule a follow-up appointment in: 2-3 weeks with Dr. Gwenlyn Found.  Any Other Special Instructions Will Be Listed Below (If Applicable).     If you need a refill on your cardiac medications before your next appointment, please call your pharmacy.

## 2015-11-07 NOTE — Assessment & Plan Note (Signed)
History of peripheral arterial disease status post left common iliac artery PTA and stenting by myself 08/19/04 for claudication. Follow up Doppler's have revealed a widely patent.

## 2015-11-07 NOTE — Assessment & Plan Note (Signed)
History of hyperlipidemia on statin therapy with recent lipid profile performed 06/05/15 revealed a total cholesterol 1:30, LDL 67 and HDL of 38.

## 2015-11-11 ENCOUNTER — Other Ambulatory Visit: Payer: Self-pay

## 2015-11-21 DIAGNOSIS — R0602 Shortness of breath: Secondary | ICD-10-CM | POA: Diagnosis not present

## 2015-11-21 DIAGNOSIS — I10 Essential (primary) hypertension: Secondary | ICD-10-CM | POA: Diagnosis not present

## 2015-11-22 LAB — BASIC METABOLIC PANEL
BUN: 21 mg/dL (ref 7–25)
CALCIUM: 9.2 mg/dL (ref 8.6–10.3)
CO2: 26 mmol/L (ref 20–31)
CREATININE: 1.23 mg/dL — AB (ref 0.70–1.18)
Chloride: 101 mmol/L (ref 98–110)
GLUCOSE: 99 mg/dL (ref 65–99)
Potassium: 4.9 mmol/L (ref 3.5–5.3)
SODIUM: 140 mmol/L (ref 135–146)

## 2015-11-26 ENCOUNTER — Telehealth: Payer: Self-pay | Admitting: Cardiovascular Disease

## 2015-11-26 ENCOUNTER — Other Ambulatory Visit (HOSPITAL_COMMUNITY): Payer: Self-pay | Admitting: *Deleted

## 2015-11-26 ENCOUNTER — Ambulatory Visit (HOSPITAL_COMMUNITY): Payer: Medicare Other | Attending: Cardiology

## 2015-11-26 ENCOUNTER — Other Ambulatory Visit: Payer: Self-pay

## 2015-11-26 DIAGNOSIS — J449 Chronic obstructive pulmonary disease, unspecified: Secondary | ICD-10-CM | POA: Diagnosis not present

## 2015-11-26 DIAGNOSIS — I480 Paroxysmal atrial fibrillation: Secondary | ICD-10-CM | POA: Insufficient documentation

## 2015-11-26 DIAGNOSIS — I10 Essential (primary) hypertension: Secondary | ICD-10-CM

## 2015-11-26 DIAGNOSIS — E785 Hyperlipidemia, unspecified: Secondary | ICD-10-CM | POA: Insufficient documentation

## 2015-11-26 DIAGNOSIS — I739 Peripheral vascular disease, unspecified: Secondary | ICD-10-CM | POA: Insufficient documentation

## 2015-11-26 DIAGNOSIS — Z720 Tobacco use: Secondary | ICD-10-CM | POA: Diagnosis not present

## 2015-11-26 DIAGNOSIS — R0602 Shortness of breath: Secondary | ICD-10-CM | POA: Diagnosis not present

## 2015-11-26 DIAGNOSIS — R0600 Dyspnea, unspecified: Secondary | ICD-10-CM

## 2015-11-26 MED ORDER — PERFLUTREN LIPID MICROSPHERE
1.0000 mL | INTRAVENOUS | Status: AC | PRN
  Administered 2015-11-26: 2 mL via INTRAVENOUS

## 2015-11-26 NOTE — Telephone Encounter (Signed)
Returned call to pt and results of labs and echo given to pt.  He verbalized understanding.

## 2015-11-26 NOTE — Telephone Encounter (Signed)
Antonio Wise is returning a call about his lab results . Please call   Thanks

## 2015-11-28 ENCOUNTER — Telehealth: Payer: Self-pay | Admitting: *Deleted

## 2015-11-28 NOTE — Telephone Encounter (Signed)
Prior Authorization completed in [email or web address] for Xarelto 20mg  (1 tablet) by mouth daily with supper. PA was approved on 11/28/15.

## 2015-12-03 ENCOUNTER — Ambulatory Visit (INDEPENDENT_AMBULATORY_CARE_PROVIDER_SITE_OTHER): Payer: Medicare Other | Admitting: Cardiovascular Disease

## 2015-12-03 ENCOUNTER — Encounter: Payer: Self-pay | Admitting: Cardiovascular Disease

## 2015-12-03 VITALS — BP 138/82 | HR 120 | Wt 200.6 lb

## 2015-12-03 DIAGNOSIS — I480 Paroxysmal atrial fibrillation: Secondary | ICD-10-CM | POA: Diagnosis not present

## 2015-12-03 DIAGNOSIS — I10 Essential (primary) hypertension: Secondary | ICD-10-CM

## 2015-12-03 MED ORDER — DILTIAZEM HCL ER COATED BEADS 240 MG PO CP24: 240.0000 mg | ORAL_CAPSULE | Freq: Every day | ORAL | Status: DC

## 2015-12-03 NOTE — Assessment & Plan Note (Signed)
History of hyperlipidemia on statin therapy. Recent lipid profile performed 06/05/15 revealing total cholesterol 1:30, LDL 67 and HDL of 38.

## 2015-12-03 NOTE — Progress Notes (Signed)
12/03/2015 Antonio Wise   June 30, 1941  [id number]  Primary Physician [name], PA-C Primary Cardiologist: Lorretta Harp MD Renae Gloss   HPI:  The patient returns today for followup. I last saw him 11/07/15.Marland Kitchen He is a 75 year old, mildly overweight, married Caucasian male, father of three, grandfather to three grandchildren with a history of peripheral vascular occlusive disease status post left common iliac artery PTA and stenting by myself August 19, 2004, for claudication. His symptoms resolved after that, and his Dopplers normalized. His other problems include discontinued tobacco abuse, only smoking two cigarettes a week, which is excellent for him. He has treated hypertension, dyslipidemia, and paroxysmal atrial fibrillation maintaining sinus rhythm on Rythmol. He has not had any A fib to his knowledge since I last saw him.   Since I saw him a year and a half ago he's done well until several weeks ago when he developed right flank pain and persistent fever.He probably had an MRI of his abdomen the results of which are unavailable to me at the current time. He underwent colonoscopy and was found to have "an infection in his colon". When I saw him in the office 3 weeks ago he was in A. Fib with RVR. I changed his amlodipine to diltiazem for better rate control and started on oral anticoagulant. Today in the office he feels clinically improved although his heart rate still is in the 120 range.   Current Outpatient Prescriptions  Medication Sig Dispense Refill  . ADVAIR DISKUS 500-50 MCG/DOSE AEPB INL 1 PUFF PO D  10  . atorvastatin (LIPITOR) 40 MG tablet Take 1 tablet (40 mg total) by mouth daily at 6 PM. 90 tablet 3  . benazepril (LOTENSIN) 40 MG tablet Take 1 tablet (40 mg total) by mouth daily. 90 tablet 1  . cloNIDine (CATAPRES) 0.1 MG tablet Take 1 tablet (0.1 mg total) by mouth 2 (two) times daily. 180 tablet 1  . hydrochlorothiazide (HYDRODIURIL) 25 MG tablet  TAKE 1 TABLET BY MOUTH DAILY 90 tablet 1  . Multiple Minerals-Vitamins (CALCIUM & VIT D3 BONE HEALTH PO) Take 1 tablet by mouth daily.    . propafenone (RYTHMOL) 225 MG tablet Take 1 tablet (225 mg total) by mouth 2 (two) times daily. 180 tablet 1  . rivaroxaban (XARELTO) 20 MG TABS tablet Take 1 tablet (20 mg total) by mouth daily with supper. 30 tablet 3  . [DISCONTINUED] diltiazem (CARDIZEM CD) 180 MG 24 hr capsule Take 1 capsule (180 mg total) by mouth daily. 90 capsule 3   No current facility-administered medications for this visit.    Allergies  Allergen Reactions  . Codeine     Social History   Social History  . Marital Status: Married    Spouse Name: N/A  . Number of Children: 3  . Years of Education: N/A   Occupational History  .  [employer]   Social History Main Topics  . Smoking status: Current Some Day Smoker -- 0.20 packs/day    Types: Cigarettes    Last Attempt to Quit: 05/17/2013  . Smokeless tobacco: Never Used  . Alcohol Use: No  . Drug Use: No  . Sexual Activity: Not on file   Other Topics Concern  . Not on file   Social History Narrative   Entered 06/2014:   His wife was also a patient of mine ([facility])   She passed away in 2015--secondary to ongoing smoker, lung cancer.    At [place] 06/2104 pt  reports that one of their kids was already living with them--so he is not alone, even now .     Review of Systems: General: negative for chills, fever, night sweats or weight changes.  Cardiovascular: negative for chest pain, dyspnea on exertion, edema, orthopnea, palpitations, paroxysmal nocturnal dyspnea or shortness of breath Dermatological: negative for rash Respiratory: negative for cough or wheezing Urologic: negative for hematuria Abdominal: negative for nausea, vomiting, diarrhea, bright red blood per rectum, melena, or hematemesis Neurologic: negative for visual changes, syncope, or dizziness All other systems reviewed and are otherwise negative  except as noted above.    Blood pressure 138/82, pulse 120, weight 200 lb 9.6 oz (90.992 kg), SpO2 94 %.  General appearance: alert and no distress Neck: no adenopathy, no carotid bruit, no JVD, supple, symmetrical, trachea midline and thyroid not enlarged, symmetric, no tenderness/mass/nodules Lungs: clear to auscultation bilaterally Heart: irregularly irregular rhythm Extremities: extremities normal, atraumatic, no cyanosis or edema  EKG not performed today  ASSESSMENT AND PLAN:   Hypertension History of hypertension blood pressure measured today at 138/82. He is on Lotensin, clonidine and Cardizem as well asbhydrochlorothiazide.Continue current meds at current dosing  Hyperlipidemia History of hyperlipidemia on statin therapy. Recent lipid profile performed 06/05/15 revealing total cholesterol 1:30, LDL 67 and HDL of 38.  Paroxysmal atrial fibrillation History of paroxysmal A. Fib currently in atrial fibrillation with a heart rate of 120. I did change his amlodipine to diltiazem several weeks ago and he does feel clinically improved. His heart rate is still elevated. I began Xarelto as well. I'm going to increase his Cardizem from 180 mg a day to 40 mg a day for better rate control. I'm going to refer him to the A. Fib clinic for consideration of an alternative antiarrhythmic medication and potential outpatient DC cardioversion.      Lorretta Harp MD FACP,FACC,FAHA, [facility] 12/03/2015 10:41 AM

## 2015-12-03 NOTE — Assessment & Plan Note (Signed)
History of hypertension blood pressure measured today at 138/82. He is on Lotensin, clonidine and Cardizem as well asbhydrochlorothiazide.Continue current meds at current dosing

## 2015-12-03 NOTE — Patient Instructions (Addendum)
Medication Instructions:  Your physician has recommended you make the following change in your medication:  1- INCREASE YOUR CARDIZEM TO 240MG  (1 TABLET) BY MOUTH ONCE A DAY.   Labwork: none  Testing/Procedures: none  Follow-Up: Your physician wants you to follow-up in: [place]. You will receive a reminder letter in the mail two months in advance. If you don't receive a letter, please call our office to schedule the follow-up appointment.  You have been referred to [facility].     Any Other Special Instructions Will Be Listed Below (If Applicable).     If you need a refill on your cardiac medications before your next appointment, please call your pharmacy.

## 2015-12-03 NOTE — Assessment & Plan Note (Signed)
History of paroxysmal A. Fib currently in atrial fibrillation with a heart rate of 120. I did change his amlodipine to diltiazem several weeks ago and he does feel clinically improved. His heart rate is still elevated. I began Xarelto as well. I'm going to increase his Cardizem from 180 mg a day to 40 mg a day for better rate control. I'm going to refer him to the A. Fib clinic for consideration of an alternative antiarrhythmic medication and potential outpatient DC cardioversion.

## 2015-12-10 ENCOUNTER — Encounter (HOSPITAL_COMMUNITY): Payer: Self-pay | Admitting: Nurse Practitioner

## 2015-12-10 ENCOUNTER — Ambulatory Visit (HOSPITAL_COMMUNITY)
Admission: RE | Admit: 2015-12-10 | Discharge: 2015-12-10 | Disposition: A | Discharge status: Home / Self Care | Payer: Medicare Other | Source: Ambulatory Visit | Attending: Nurse Practitioner | Admitting: Nurse Practitioner

## 2015-12-10 VITALS — BP 156/74 | HR 71 | Ht 73.0 in | Wt 200.0 lb

## 2015-12-10 DIAGNOSIS — Z833 Family history of diabetes mellitus: Secondary | ICD-10-CM | POA: Insufficient documentation

## 2015-12-10 DIAGNOSIS — J449 Chronic obstructive pulmonary disease, unspecified: Secondary | ICD-10-CM | POA: Diagnosis not present

## 2015-12-10 DIAGNOSIS — E559 Vitamin D deficiency, unspecified: Secondary | ICD-10-CM | POA: Diagnosis not present

## 2015-12-10 DIAGNOSIS — I480 Paroxysmal atrial fibrillation: Secondary | ICD-10-CM | POA: Diagnosis not present

## 2015-12-10 DIAGNOSIS — E785 Hyperlipidemia, unspecified: Secondary | ICD-10-CM | POA: Diagnosis not present

## 2015-12-10 DIAGNOSIS — Z885 Allergy status to narcotic agent status: Secondary | ICD-10-CM | POA: Insufficient documentation

## 2015-12-10 DIAGNOSIS — Z95820 Peripheral vascular angioplasty status with implants and grafts: Secondary | ICD-10-CM | POA: Insufficient documentation

## 2015-12-10 DIAGNOSIS — I10 Essential (primary) hypertension: Secondary | ICD-10-CM | POA: Insufficient documentation

## 2015-12-10 DIAGNOSIS — F17210 Nicotine dependence, cigarettes, uncomplicated: Secondary | ICD-10-CM | POA: Insufficient documentation

## 2015-12-10 DIAGNOSIS — Z7901 Long term (current) use of anticoagulants: Secondary | ICD-10-CM | POA: Insufficient documentation

## 2015-12-10 DIAGNOSIS — I739 Peripheral vascular disease, unspecified: Secondary | ICD-10-CM | POA: Insufficient documentation

## 2015-12-10 DIAGNOSIS — Z8719 Personal history of other diseases of the digestive system: Secondary | ICD-10-CM | POA: Insufficient documentation

## 2015-12-10 DIAGNOSIS — Z825 Family history of asthma and other chronic lower respiratory diseases: Secondary | ICD-10-CM | POA: Insufficient documentation

## 2015-12-10 DIAGNOSIS — I4891 Unspecified atrial fibrillation: Secondary | ICD-10-CM | POA: Diagnosis present

## 2015-12-10 DIAGNOSIS — Z79899 Other long term (current) drug therapy: Secondary | ICD-10-CM | POA: Diagnosis not present

## 2015-12-10 DIAGNOSIS — Z8249 Family history of ischemic heart disease and other diseases of the circulatory system: Secondary | ICD-10-CM | POA: Insufficient documentation

## 2015-12-11 ENCOUNTER — Encounter (HOSPITAL_COMMUNITY): Payer: Self-pay | Admitting: Nurse Practitioner

## 2015-12-11 NOTE — Progress Notes (Signed)
Patient ID: Antonio Wise, male   DOB: 1940-07-13, 75 y.o.   MRN: [id number]     Primary Care Physician: [name], PA-C Referring Physician:[facility] Cardiologist: Dr. Shaune Pollack is a 75 y.o. male with a h/o PAF that recently had breakthrough persistent afib x several weeks in the setting of intestinal infection now resolved with use of antibiotics. Has been on propafenone for years and has not had any afib burden.He was recently seen by Dr. Gwenlyn Found and found in afib with rvr and amlodipine was stopped and cardizem 240 mg was added and he was referred here. Today, he is in NSR. The pt cannot tell if he is in or not in afib. He feels well today. He is taking xarelto without bleeding issues, chadsvasc score of 3(htn, age, vascular disease). This was recently started and on appropriate dose of 20 mg with a creat cl calculated of 68.6. No alcohol use. Smoking but has cut back to 2 cigarettes a week. No significant caffeine. Denies snoring history.  Today, he denies symptoms of palpitations, chest pain, shortness of breath, orthopnea, PND, lower extremity edema, dizziness, presyncope, syncope, or neurologic sequela. The patient is tolerating medications without difficulties and is otherwise without complaint today.   Past Medical History  Diagnosis Date  . Hypertension   . Hyperlipidemia   . Vitamin D deficiency   . Smoker unmotivated to quit   . Peripheral vascular disease ([place])   . Paroxysmal atrial fibrillation (HCC)   . Elevated PSA   . COPD (chronic obstructive pulmonary disease) ([place])   . H/O Helicobacter infection 05/06/2008    EGD  . Gastric ulcer 05/06/2008    EGD  . Duodenal ulcer 05/06/2008    EGD  . Colon polyp 05/06/2008    repeat 5 years  . History of nuclear stress test 02/19/2010    bruce protocol; mild-mod perfusion defect in spetal region consistent with attenuation artifact; no significant ischemia demonstrated; dysrhythmia during exercise  .  Paroxysmal atrial fibrillation [facility])    Past Surgical History  Procedure Laterality Date  . Iliac vein angioplasty / stenting  05/19/2005    left CIA PTA & stenting   . Transthoracic echocardiogram  06/10/2005    LV hyperdynamic; borderline LA enlargement; trace MR/TR/AVR    Current Outpatient Prescriptions  Medication Sig Dispense Refill  . ADVAIR DISKUS 500-50 MCG/DOSE AEPB INL 1 PUFF PO D  10  . atorvastatin (LIPITOR) 40 MG tablet Take 1 tablet (40 mg total) by mouth daily at 6 PM. 90 tablet 3  . benazepril (LOTENSIN) 40 MG tablet Take 1 tablet (40 mg total) by mouth daily. 90 tablet 1  . cloNIDine (CATAPRES) 0.1 MG tablet Take 1 tablet (0.1 mg total) by mouth 2 (two) times daily. 180 tablet 1  . diltiazem (CARDIZEM CD) 240 MG 24 hr capsule Take 1 capsule (240 mg total) by mouth daily. 90 capsule 3  . hydrochlorothiazide (HYDRODIURIL) 25 MG tablet TAKE 1 TABLET BY MOUTH DAILY 90 tablet 1  . Multiple Minerals-Vitamins (CALCIUM & VIT D3 BONE HEALTH PO) Take 1 tablet by mouth daily.    . propafenone (RYTHMOL) 225 MG tablet Take 1 tablet (225 mg total) by mouth 2 (two) times daily. 180 tablet 1  . rivaroxaban (XARELTO) 20 MG TABS tablet Take 1 tablet (20 mg total) by mouth daily with supper. 30 tablet 3   No current facility-administered medications for this encounter.    Allergies  Allergen Reactions  . Codeine  Social History   Social History  . Marital Status: Married    Spouse Name: N/A  . Number of Children: 3  . Years of Education: N/A   Occupational History  .  [employer]   Social History Main Topics  . Smoking status: Current Some Day Smoker -- 0.20 packs/day    Types: Cigarettes    Last Attempt to Quit: 05/17/2013  . Smokeless tobacco: Never Used  . Alcohol Use: No  . Drug Use: No  . Sexual Activity: Not on file   Other Topics Concern  . Not on file   Social History Narrative   Entered 06/2014:   His wife was also a patient of mine ([facility])   She  passed away in 2015--secondary to ongoing smoker, lung cancer.    At [place] 06/2104 pt reports that one of their kids was already living with them--so he is not alone, even now .    Family History  Problem Relation Age of Onset  . Heart disease Mother   . COPD Father   . Cancer Sister 54    Breast Cancer  . Diabetes Brother   . Cancer Father     ROS- All systems are reviewed and negative except as per the HPI above  Physical Exam: Filed Vitals:   12/10/15 0915 12/10/15 1035  BP: 178/82 156/74  Pulse: 71   Height: 6\' 1"  (1.854 m)   Weight: 200 lb (90.719 kg)     GEN- The patient is well appearing, alert and oriented x 3 today.   Head- normocephalic, atraumatic Eyes-  Sclera clear, conjunctiva pink Ears- hearing intact Oropharynx- clear Neck- supple, no JVP Lymph- no cervical lymphadenopathy Lungs- Clear to ausculation bilaterally, normal work of breathing Heart- Regular rate and rhythm, no murmurs, rubs or gallops, PMI not laterally displaced GI- soft, NT, ND, + BS Extremities- no clubbing, cyanosis, or edema MS- no significant deformity or atrophy Skin- no rash or lesion Psych- euthymic mood, full affect Neuro- strength and sensation are intact  EKG-SR with first degree AVB, pr int 248 ms, qrs int 80 ms, QTc 408 ms Epic records reviewed Echo-Left ventricle: The cavity size was normal. Systolic function was  normal. The estimated ejection fraction was in the range of 55%  to 60%. Wall motion was normal; there were no regional wall  motion abnormalities. - Aortic valve: Moderate thickening and calcification. - Mitral valve: Calcified annulus. Mild diffuse calcification of  the anterior leaflet. - Atrial septum: There was increased thickness of the septum,  consistent with lipomatous hypertrophy. Left atrial size 26 mm  Assessment and Plan: 1. Asymptomatic PAF Now in SR Continue diltiazem Continue propafenone 225 mg bid Continue xarelto   2.HTN Improved on  recheck but still not optimal Decrease salt intake Continue ace, clonidine, dilt,  Continue smoking cessation efforts  F/u in one month

## 2015-12-16 ENCOUNTER — Ambulatory Visit: Payer: No Typology Code available for payment source | Admitting: Physician Assistant

## 2016-01-02 ENCOUNTER — Other Ambulatory Visit: Payer: Self-pay | Admitting: Physician Assistant

## 2016-01-02 NOTE — Telephone Encounter (Signed)
Refill appropriate and filled per protocol. 

## 2016-01-03 ENCOUNTER — Other Ambulatory Visit: Payer: Self-pay | Admitting: Physician Assistant

## 2016-01-03 NOTE — Telephone Encounter (Signed)
Refill appropriate and filled per protocol. 

## 2016-01-09 ENCOUNTER — Encounter (HOSPITAL_COMMUNITY): Payer: Self-pay | Admitting: Nurse Practitioner

## 2016-01-09 ENCOUNTER — Other Ambulatory Visit: Payer: Self-pay

## 2016-01-09 ENCOUNTER — Ambulatory Visit (HOSPITAL_COMMUNITY)
Admission: RE | Admit: 2016-01-09 | Discharge: 2016-01-09 | Disposition: A | Discharge status: Home / Self Care | Payer: Medicare Other | Source: Ambulatory Visit | Attending: Nurse Practitioner | Admitting: Nurse Practitioner

## 2016-01-09 VITALS — BP 140/80 | HR 68 | Ht 73.0 in | Wt 198.2 lb

## 2016-01-09 DIAGNOSIS — Z8249 Family history of ischemic heart disease and other diseases of the circulatory system: Secondary | ICD-10-CM | POA: Insufficient documentation

## 2016-01-09 DIAGNOSIS — Z8719 Personal history of other diseases of the digestive system: Secondary | ICD-10-CM | POA: Insufficient documentation

## 2016-01-09 DIAGNOSIS — I480 Paroxysmal atrial fibrillation: Secondary | ICD-10-CM | POA: Diagnosis not present

## 2016-01-09 DIAGNOSIS — Z825 Family history of asthma and other chronic lower respiratory diseases: Secondary | ICD-10-CM | POA: Diagnosis not present

## 2016-01-09 DIAGNOSIS — E559 Vitamin D deficiency, unspecified: Secondary | ICD-10-CM | POA: Insufficient documentation

## 2016-01-09 DIAGNOSIS — Z833 Family history of diabetes mellitus: Secondary | ICD-10-CM | POA: Insufficient documentation

## 2016-01-09 DIAGNOSIS — Z7901 Long term (current) use of anticoagulants: Secondary | ICD-10-CM | POA: Diagnosis not present

## 2016-01-09 DIAGNOSIS — I10 Essential (primary) hypertension: Secondary | ICD-10-CM | POA: Diagnosis not present

## 2016-01-09 DIAGNOSIS — I739 Peripheral vascular disease, unspecified: Secondary | ICD-10-CM | POA: Diagnosis not present

## 2016-01-09 DIAGNOSIS — Z79899 Other long term (current) drug therapy: Secondary | ICD-10-CM | POA: Diagnosis not present

## 2016-01-09 DIAGNOSIS — Z885 Allergy status to narcotic agent status: Secondary | ICD-10-CM | POA: Insufficient documentation

## 2016-01-09 DIAGNOSIS — F17210 Nicotine dependence, cigarettes, uncomplicated: Secondary | ICD-10-CM | POA: Insufficient documentation

## 2016-01-09 DIAGNOSIS — J449 Chronic obstructive pulmonary disease, unspecified: Secondary | ICD-10-CM | POA: Diagnosis not present

## 2016-01-09 DIAGNOSIS — E785 Hyperlipidemia, unspecified: Secondary | ICD-10-CM | POA: Diagnosis not present

## 2016-01-09 NOTE — Progress Notes (Signed)
Patient ID: Antonio Wise, male   DOB: [birth year], 75 y.o.   MRN: [id number]     Primary Care Physician: [name], PA-C Referring Physician:[facility] Cardiologist: Dr. Shaune Pollack is a 75 y.o. male, seen in [place] clinic, 6/6,  with a h/o PAF that recently had breakthrough persistent afib x several weeks in the setting of intestinal infection now resolved with use of antibiotics. Has been on propafenone for years and has not had any afib burden.He was recently seen by Dr. Gwenlyn Found and found in afib with rvr and amlodipine was stopped and cardizem 240 mg was added and he was referred here. Today, he is in NSR. The pt cannot tell if he is in or not in afib. He feels well today. He is taking xarelto without bleeding issues, chadsvasc score of 3(htn, age, vascular disease). This was recently started and on appropriate dose of 20 mg with a creat cl calculated of 68.6. No alcohol use. Smoking but has cut back to 2 cigarettes a week. No significant caffeine. Denies snoring history.  He returns to clinic, 7/6 and has not had any further afib. Blood pressure not optimally controlled, better on recheck. Has appointment with PCP who treats BP in 1-2 weeks. He was encouraged to stop smoking.  Today, he denies symptoms of palpitations, chest pain, shortness of breath, orthopnea, PND, lower extremity edema, dizziness, presyncope, syncope, or neurologic sequela. The patient is tolerating medications without difficulties and is otherwise without complaint today.   Past Medical History  Diagnosis Date  . Hypertension   . Hyperlipidemia   . Vitamin D deficiency   . Smoker unmotivated to quit   . Peripheral vascular disease ([place])   . Paroxysmal atrial fibrillation (HCC)   . Elevated PSA   . COPD (chronic obstructive pulmonary disease) ([place])   . H/O Helicobacter infection 05/06/2008    EGD  . Gastric ulcer 05/06/2008    EGD  . Duodenal ulcer 05/06/2008    EGD  . Colon polyp 05/06/2008   repeat 5 years  . History of nuclear stress test 02/19/2010    bruce protocol; mild-mod perfusion defect in spetal region consistent with attenuation artifact; no significant ischemia demonstrated; dysrhythmia during exercise  . Paroxysmal atrial fibrillation [facility])    Past Surgical History  Procedure Laterality Date  . Iliac vein angioplasty / stenting  05/19/2005    left CIA PTA & stenting   . Transthoracic echocardiogram  06/10/2005    LV hyperdynamic; borderline LA enlargement; trace MR/TR/AVR    Current Outpatient Prescriptions  Medication Sig Dispense Refill  . ADVAIR DISKUS 500-50 MCG/DOSE AEPB INL 1 PUFF PO D  10  . atorvastatin (LIPITOR) 40 MG tablet Take 1 tablet (40 mg total) by mouth daily at 6 PM. 90 tablet 3  . benazepril (LOTENSIN) 40 MG tablet TAKE 1 TABLET BY MOUTH EVERY DAY 90 tablet 0  . cloNIDine (CATAPRES) 0.1 MG tablet TAKE 1 TABLET BY MOUTH TWICE DAILY 180 tablet 0  . diltiazem (CARDIZEM CD) 240 MG 24 hr capsule Take 1 capsule (240 mg total) by mouth daily. 90 capsule 3  . hydrochlorothiazide (HYDRODIURIL) 25 MG tablet TAKE 1 TABLET BY MOUTH DAILY 90 tablet 1  . Multiple Minerals-Vitamins (CALCIUM & VIT D3 BONE HEALTH PO) Take 1 tablet by mouth daily.    . propafenone (RYTHMOL) 225 MG tablet TAKE 1 TABLET BY MOUTH TWICE DAILY 180 tablet 0  . rivaroxaban (XARELTO) 20 MG TABS tablet Take 1 tablet (20  mg total) by mouth daily with supper. 30 tablet 3   No current facility-administered medications for this encounter.    Allergies  Allergen Reactions  . Codeine     Social History   Social History  . Marital Status: Married    Spouse Name: N/A  . Number of Children: 3  . Years of Education: N/A   Occupational History  .  [employer]   Social History Main Topics  . Smoking status: Current Some Day Smoker -- 0.20 packs/day    Types: Cigarettes    Last Attempt to Quit: 05/17/2013  . Smokeless tobacco: Never Used  . Alcohol Use: No  . Drug Use: No  .  Sexual Activity: Not on file   Other Topics Concern  . Not on file   Social History Narrative   Entered 06/2014:   His wife was also a patient of mine ([facility])   She passed away in 2015--secondary to ongoing smoker, lung cancer.    At [place] 06/2104 pt reports that one of their kids was already living with them--so he is not alone, even now .    Family History  Problem Relation Age of Onset  . Heart disease Mother   . COPD Father   . Cancer Sister 47    Breast Cancer  . Diabetes Brother   . Cancer Father     ROS- All systems are reviewed and negative except as per the HPI above  Physical Exam: Filed Vitals:   01/09/16 0852 01/09/16 0930  BP: 178/86 140/80  Pulse: 68   Height: 6\' 1"  (1.854 m)   Weight: 198 lb 3.2 oz (89.903 kg)     GEN- The patient is well appearing, alert and oriented x 3 today.   Head- normocephalic, atraumatic Eyes-  Sclera clear, conjunctiva pink Ears- hearing intact Oropharynx- clear Neck- supple, no JVP Lymph- no cervical lymphadenopathy Lungs- Clear to ausculation bilaterally, normal work of breathing Heart- Regular rate and rhythm, no murmurs, rubs or gallops, PMI not laterally displaced GI- soft, NT, ND, + BS Extremities- no clubbing, cyanosis, or edema MS- no significant deformity or atrophy Skin- no rash or lesion Psych- euthymic mood, full affect Neuro- strength and sensation are intact  EKG-SR with first degree AVB, pr int 224 ms, qrs int 92 ms, QTc 421 ms Epic records reviewed Echo-Left ventricle: The cavity size was normal. Systolic function was  normal. The estimated ejection fraction was in the range of 55%  to 60%. Wall motion was normal; there were no regional wall  motion abnormalities. - Aortic valve: Moderate thickening and calcification. - Mitral valve: Calcified annulus. Mild diffuse calcification of  the anterior leaflet. - Atrial septum: There was increased thickness of the septum,  consistent with lipomatous  hypertrophy. Left atrial size 26 mm  Assessment and Plan: 1. Asymptomatic PAF Now in SR Continue diltiazem Continue propafenone 225 mg bid Continue xarelto   2.HTN Improved on recheck but still not optimal Decrease salt intake Continue ace, clonidine, dilt,  Continue smoking cessation efforts F/U with PCP re better blood pressure management pending in the next 1-2 weeks  F/u afib clinic as needed  Butch Penny C. [place], [facility] [address] [place], [place] [address] [phone]

## 2016-01-13 ENCOUNTER — Other Ambulatory Visit: Payer: Self-pay | Admitting: Physician Assistant

## 2016-01-14 NOTE — Telephone Encounter (Signed)
Amlodipine refill denied, has been discontinued by Cardiology

## 2016-02-27 ENCOUNTER — Other Ambulatory Visit: Payer: Self-pay | Admitting: Cardiovascular Disease

## 2016-02-27 DIAGNOSIS — I10 Essential (primary) hypertension: Secondary | ICD-10-CM

## 2016-02-27 DIAGNOSIS — R0602 Shortness of breath: Secondary | ICD-10-CM

## 2016-03-03 ENCOUNTER — Other Ambulatory Visit: Payer: Self-pay

## 2016-03-23 DIAGNOSIS — R972 Elevated prostate specific antigen [PSA]: Secondary | ICD-10-CM | POA: Diagnosis not present

## 2016-03-29 ENCOUNTER — Other Ambulatory Visit: Payer: Self-pay | Admitting: Cardiovascular Disease

## 2016-03-29 DIAGNOSIS — R0602 Shortness of breath: Secondary | ICD-10-CM

## 2016-03-29 DIAGNOSIS — I10 Essential (primary) hypertension: Secondary | ICD-10-CM

## 2016-03-30 DIAGNOSIS — R972 Elevated prostate specific antigen [PSA]: Secondary | ICD-10-CM | POA: Diagnosis not present

## 2016-03-30 DIAGNOSIS — N400 Enlarged prostate without lower urinary tract symptoms: Secondary | ICD-10-CM | POA: Diagnosis not present

## 2016-03-31 ENCOUNTER — Other Ambulatory Visit: Payer: Self-pay | Admitting: Physician Assistant

## 2016-03-31 NOTE — Telephone Encounter (Signed)
Rx filled per protocol  

## 2016-03-31 NOTE — Telephone Encounter (Signed)
RX filled per protocol. Pt needs follow up appt for routine office visit

## 2016-04-02 ENCOUNTER — Other Ambulatory Visit: Payer: Self-pay | Admitting: Family Medicine

## 2016-04-08 ENCOUNTER — Ambulatory Visit (INDEPENDENT_AMBULATORY_CARE_PROVIDER_SITE_OTHER): Payer: Medicare Other | Admitting: Physician Assistant

## 2016-04-08 ENCOUNTER — Encounter: Payer: Self-pay | Admitting: Physician Assistant

## 2016-04-08 VITALS — BP 110/60 | HR 61 | Temp 97.5°F | Wt 199.0 lb

## 2016-04-08 DIAGNOSIS — R739 Hyperglycemia, unspecified: Secondary | ICD-10-CM

## 2016-04-08 DIAGNOSIS — J439 Emphysema, unspecified: Secondary | ICD-10-CM

## 2016-04-08 DIAGNOSIS — E785 Hyperlipidemia, unspecified: Secondary | ICD-10-CM | POA: Diagnosis not present

## 2016-04-08 DIAGNOSIS — I10 Essential (primary) hypertension: Secondary | ICD-10-CM

## 2016-04-08 DIAGNOSIS — F17210 Nicotine dependence, cigarettes, uncomplicated: Secondary | ICD-10-CM | POA: Diagnosis not present

## 2016-04-08 DIAGNOSIS — I739 Peripheral vascular disease, unspecified: Secondary | ICD-10-CM | POA: Diagnosis not present

## 2016-04-08 DIAGNOSIS — Z1212 Encounter for screening for malignant neoplasm of rectum: Secondary | ICD-10-CM

## 2016-04-08 DIAGNOSIS — R972 Elevated prostate specific antigen [PSA]: Secondary | ICD-10-CM | POA: Diagnosis not present

## 2016-04-08 DIAGNOSIS — E559 Vitamin D deficiency, unspecified: Secondary | ICD-10-CM | POA: Diagnosis not present

## 2016-04-08 DIAGNOSIS — Z23 Encounter for immunization: Secondary | ICD-10-CM | POA: Diagnosis not present

## 2016-04-08 DIAGNOSIS — Z1211 Encounter for screening for malignant neoplasm of colon: Secondary | ICD-10-CM

## 2016-04-08 DIAGNOSIS — I480 Paroxysmal atrial fibrillation: Secondary | ICD-10-CM

## 2016-04-08 LAB — LIPID PANEL
CHOLESTEROL: 163 mg/dL (ref 125–200)
HDL: 43 mg/dL (ref >=40)
LDL Cholesterol: 86 mg/dL (ref <130)
TRIGLYCERIDES: 168 mg/dL — AB (ref <150)
Total CHOL/HDL Ratio: 3.8 Ratio (ref <=5.0)
VLDL: 34 mg/dL — AB (ref <30)

## 2016-04-08 LAB — COMPLETE METABOLIC PANEL WITH GFR
ALT: 14 U/L (ref 9–46)
AST: 12 U/L (ref 10–35)
Albumin: 4.7 g/dL (ref 3.6–5.1)
Alkaline Phosphatase: 73 U/L (ref 40–115)
BILIRUBIN TOTAL: 0.9 mg/dL (ref 0.2–1.2)
BUN: 29 mg/dL — ABNORMAL HIGH (ref 7–25)
CALCIUM: 10.3 mg/dL (ref 8.6–10.3)
CHLORIDE: 103 mmol/L (ref 98–110)
CO2: 27 mmol/L (ref 20–31)
CREATININE: 1.84 mg/dL — AB (ref 0.70–1.18)
GFR, EST AFRICAN AMERICAN: 41 mL/min — AB (ref >=60)
GFR, Est Non African American: 36 mL/min — ABNORMAL LOW (ref >=60)
Glucose, Bld: 128 mg/dL — ABNORMAL HIGH (ref 70–99)
Potassium: 5.4 mmol/L — ABNORMAL HIGH (ref 3.5–5.3)
Sodium: 148 mmol/L — ABNORMAL HIGH (ref 135–146)
Total Protein: 7.6 g/dL (ref 6.1–8.1)

## 2016-04-08 NOTE — Progress Notes (Signed)
Patient ID: Antonio Wise MRN: [id number], DOB: 1940/10/27, 75 y.o. Date of Encounter: @DATE @  Chief Complaint:  Chief Complaint  Patient presents with  . Follow-up    HPI: 75 y.o. year old white male  presents for routine followup.  His wife died from cancer 01/11/14.  At office visit 01/2015 we discussed the fact that one of his daughters lives with him and another daughter lives across the street from him. Says that they make sure he is "staying out of trouble and behaving himself ". They make sure that he is eating etc. Says he did start smoking --but is cutting back down--currently about 1/2 ppd. At office visit 01/2015 I discussed the fact that I thought he had used Chantix in the past and it worked for him. He says that it did work well for him and did not cause him any side effects. He is agreeable to use Chantix again and wants me to print him prescriptions today. At [place] 06/05/2015--he states that he has just about quit smoking completely. Says that he rarely smokes a cigarette here and there. Says that he has off of the Chantix now. At [place] 04/08/2016---says he si smoking 8 -10 ciagarettes pr week.   At [place] 11/2013 he reported that since starting the Symbicort he does notice a difference. He is using it routinely as directed. At [place] 01/2015 discussed that it is expensive. I did give him 2 samples at that visit. At [place] 06/05/2015 he says that he recently got information from his insurance that they are not going to cover Symbicort for the upcoming year.  They will cover Advair. At [place] 04/08/2016--- requesting samples of Advair---"has gotten in the donut hole with that stuff" --------------------------on no additional COPD meds sec to cost  He had a OV with me on 04/06/13 for evaluation of COPD. He had gone in for DOT physical and they had done PFTs which revealed severe COPD. He was told to followup with PCP for treatment. At that visit I prescribed Chantix and also prescribed Symbicort.   Considered adding Spiriva but cost is going to be an issue. At visit 01/2015 I gave him 2 sample boxes of Symbicort. At visit 06/05/15 he reported that insurance is not going to be covering Symbicort for the upcoming year. They will cover Advair.  He is taking blood pressure medications as directed. He is taking cholesterol medication as directed. No myalgias or other adverse effects.  At time of his OV with me 05/2015---he was just seeing Dr. Gwenlyn Found just once a year.  However, he recently had to have multiple visits at Cardiology. Saw Dr. Gwenlyn Found 11/07/15 and 12/03/15. Saw NP at Cardiology 12/10/15 and 01/09/16. Patient says that medications were adjusted.  Also still sees Urology once a year-- 04/08/2016---says he just had a visit there with them last week and again they said everything looked okay and can wait one year for follow-up.   No complaints today.  Past Medical History:  Diagnosis Date  . Colon polyp 05/06/2008   repeat 5 years  . COPD (chronic obstructive pulmonary disease) ([place])   . Duodenal ulcer 05/06/2008   EGD  . Elevated PSA   . Gastric ulcer 05/06/2008   EGD  . H/O Helicobacter infection 05/06/2008   EGD  . History of nuclear stress test 02/19/2010   bruce protocol; mild-mod perfusion defect in spetal region consistent with attenuation artifact; no significant ischemia demonstrated; dysrhythmia during exercise  . Hyperlipidemia   .  Hypertension   . Paroxysmal atrial fibrillation (HCC)   . Paroxysmal atrial fibrillation (HCC)   . Peripheral vascular disease ([place])   . Smoker unmotivated to quit   . Vitamin D deficiency      Home Meds:  Outpatient Medications Prior to Visit  Medication Sig Dispense Refill  . atorvastatin (LIPITOR) 40 MG tablet Take 1 tablet (40 mg total) by mouth daily at 6 PM. 90 tablet 3  . benazepril (LOTENSIN) 40 MG tablet TAKE 1 TABLET BY MOUTH EVERY DAY 60 tablet 0  . cloNIDine (CATAPRES) 0.1 MG tablet TAKE 1 TABLET BY MOUTH TWICE DAILY 180 tablet 0    . hydrochlorothiazide (HYDRODIURIL) 25 MG tablet TAKE 1 TABLET BY MOUTH DAILY 90 tablet 1  . XARELTO 20 MG TABS tablet TAKE 1 TABLET(20 MG) BY MOUTH DAILY WITH SUPPER 30 tablet 5  . ADVAIR DISKUS 500-50 MCG/DOSE AEPB INL 1 PUFF PO D  10  . diltiazem (CARDIZEM CD) 240 MG 24 hr capsule Take 1 capsule (240 mg total) by mouth daily. (Patient not taking: Reported on 04/08/2016) 90 capsule 3  . Multiple Minerals-Vitamins (CALCIUM & VIT D3 BONE HEALTH PO) Take 1 tablet by mouth daily.    . propafenone (RYTHMOL) 225 MG tablet TAKE 1 TABLET BY MOUTH TWICE DAILY (Patient not taking: Reported on 04/08/2016) 120 tablet 0   No facility-administered medications prior to visit.      Allergies:  Allergies  Allergen Reactions  . Codeine     Social History   Social History  . Marital status: Married    Spouse name: N/A  . Number of children: 3  . Years of education: N/A   Occupational History  .  [employer]   Social History Main Topics  . Smoking status: Current Some Day Smoker    Packs/day: 0.20    Types: Cigarettes    Last attempt to quit: 05/17/2013  . Smokeless tobacco: Never Used  . Alcohol use No  . Drug use: No  . Sexual activity: Not on file   Other Topics Concern  . Not on file   Social History Narrative   Entered 06/2014:   His wife was also a patient of mine ([facility])   She passed away in 2015--secondary to ongoing smoker, lung cancer.    At [place] 06/2104 pt reports that one of their kids was already living with them--so he is not alone, even now .    Family History  Problem Relation Age of Onset  . Heart disease Mother   . COPD Father   . Cancer Sister 13    Breast Cancer  . Diabetes Brother   . Cancer Father      Review of Systems:  See HPI for pertinent ROS. All other ROS negative.    Physical Exam: Blood pressure 110/60, pulse 61, temperature 97.5 F (36.4 C), temperature source Oral, weight 199 lb (90.3 kg), SpO2 97 %., Body mass index is 26.25  kg/m. General: WNWD WM. Appears in no acute distress. Neck: Supple. No thyromegaly. No lymphadenopathy. No carotid bruits. Lungs: Breath sounds are decreased and distant. However there arehe was just seeing Dr. Gwenlyn Found just once a year.  However, he recently had to have multiple visits at Cardiology. Saw Dr. Gwenlyn Found 11/07/15 and 12/03/15. Saw NP at Cardiology 12/10/15 and 01/09/16. Patient says that medications were adjusted.  Also still sees Urology once a year-- 04/08/2016---says he just had a visit there with them last week and again they said everything looked okay and can wait one year for follow-up.   No complaints today.  Past Medical History:  Diagnosis Date  . Colon polyp 05/06/2008   repeat 5 years  . COPD (chronic obstructive pulmonary disease) ([place])   . Duodenal ulcer 05/06/2008   EGD  . Elevated PSA   . Gastric ulcer 05/06/2008   EGD  . H/O Helicobacter infection 05/06/2008   EGD  . History of nuclear stress test 02/19/2010   bruce protocol; mild-mod perfusion defect in spetal region consistent with attenuation artifact; no significant ischemia demonstrated; dysrhythmia during exercise  . Hyperlipidemia   .  Hypertension   . Paroxysmal atrial fibrillation (HCC)   . Paroxysmal atrial fibrillation (HCC)   . Peripheral vascular disease ([place])   . Smoker unmotivated to quit   . Vitamin D deficiency      Home Meds:  Outpatient Medications Prior to Visit  Medication Sig Dispense Refill  . atorvastatin (LIPITOR) 40 MG tablet Take 1 tablet (40 mg total) by mouth daily at 6 PM. 90 tablet 3  . benazepril (LOTENSIN) 40 MG tablet TAKE 1 TABLET BY MOUTH EVERY DAY 60 tablet 0  . cloNIDine (CATAPRES) 0.1 MG tablet TAKE 1 TABLET BY MOUTH TWICE DAILY 180 tablet 0    . hydrochlorothiazide (HYDRODIURIL) 25 MG tablet TAKE 1 TABLET BY MOUTH DAILY 90 tablet 1  . XARELTO 20 MG TABS tablet TAKE 1 TABLET(20 MG) BY MOUTH DAILY WITH SUPPER 30 tablet 5  . ADVAIR DISKUS 500-50 MCG/DOSE AEPB INL 1 PUFF PO D  10  . diltiazem (CARDIZEM CD) 240 MG 24 hr capsule Take 1 capsule (240 mg total) by mouth daily. (Patient not taking: Reported on 04/08/2016) 90 capsule 3  . Multiple Minerals-Vitamins (CALCIUM & VIT D3 BONE HEALTH PO) Take 1 tablet by mouth daily.    . propafenone (RYTHMOL) 225 MG tablet TAKE 1 TABLET BY MOUTH TWICE DAILY (Patient not taking: Reported on 04/08/2016) 120 tablet 0   No facility-administered medications prior to visit.      Allergies:  Allergies  Allergen Reactions  . Codeine     Social History   Social History  . Marital status: Married    Spouse name: N/A  . Number of children: 3  . Years of education: N/A   Occupational History  .  [employer]   Social History Main Topics  . Smoking status: Current Some Day Smoker    Packs/day: 0.20    Types: Cigarettes    Last attempt to quit: 05/17/2013  . Smokeless tobacco: Never Used  . Alcohol use No  . Drug use: No  . Sexual activity: Not on file   Other Topics Concern  . Not on file   Social History Narrative   Entered 06/2014:   His wife was also a patient of mine ([facility])   She passed away in 2015--secondary to ongoing smoker, lung cancer.    At [place] 06/2104 pt reports that one of their kids was already living with them--so he is not alone, even now .    Family History  Problem Relation Age of Onset  . Heart disease Mother   . COPD Father   . Cancer Sister 13    Breast Cancer  . Diabetes Brother   . Cancer Father      Review of Systems:  See HPI for pertinent ROS. All other ROS negative.    Physical Exam: Blood pressure 110/60, pulse 61, temperature 97.5 F (36.4 C), temperature source Oral, weight 199 lb (90.3 kg), SpO2 97 %., Body mass index is 26.25  kg/m. General: WNWD WM. Appears in no acute distress. Neck: Supple. No thyromegaly. No lymphadenopathy. No carotid bruits. Lungs: Breath sounds are decreased and distant. However there are no active wheezes. Heart: RRR with S1 S2. No murmurs, rubs, or gallops. Abdomen: Soft, non-tender, non-distended with normoactive bowel sounds. No hepatomegaly. No rebound/guarding. No obvious abdominal masses. Musculoskeletal:  Strength and tone normal for age. Extremities/Skin: Warm and dry. No LE edema.  Neuro: Alert and oriented X 3. Moves all extremities spontaneously. Gait is normal. CNII-XII grossly in tact. Psych:  Responds  to questions appropriately with a normal affect.     ASSESSMENT AND PLAN:  75 y.o. year old male with  1. Hypertension Blood pressure is at goal. Continue current medications. Check labs monitor. - COMPLETE METABOLIC PANEL WITH GFR   2. Hyperlipidemia On Lipitor . Due to recheck now. - COMPLETE METABOLIC PANEL WITH GFR - Lipid Panel   3. Vitamin D deficiency Last  vitamin D was normal 06/14/13.  4. Smoker --has almost completely quit 04/2013--He was motivated to quit finally- Given recent DOT physical info and fact that his wife, a heavy smoker, had lung cancer (has passed away 11/01/2013).  In past, his PAD was not motivation enough.  Because of wife's death and "his nerves" he did start back smoking but is motivated to quit--again. At [name] 01/2015 was smoking about one half pack per day but was agreeable to use Chantix again. At [place] 01/2015 a printed him to suffer prescriptions. One for the starting dose pack and one for the continuing Dosepaks with several refills on that one.  At [place] 05/2015 I discussed with him whether he feels that he needs to get back on the Chantix to make sure he does not start back to routine smoking. He does not feel that he needs the Chantix but I told him to call me if he finds himself smoking more. At [place] 04/08/2016---smoking 8 - 10 cig per  day  5. Peripheral vascular disease Managed by Dr. Gwenlyn Found - COMPLETE METABOLIC PANEL WITH GFR - Lipid Panel - Hemoglobin A1c   6. Paroxysmal atrial fibrillation Managed by Dr. Gwenlyn Found  7. Elevated PSA History of negative biopsy. Sees urology annually.   8. COPD (chronic obstructive pulmonary disease) 06/05/15 changed Symbicort to Advair secondary to insurance coverage. Continue the smoking cessation.   9. Screening for colorectal cancer had colonoscopy  November 2009. It showed polyp. Repeat 5 years. Had Colonoscopy November 2015--3 polyps--f/u 5 years.    10. Hyperglycemia - Hemoglobin A1c   11. History of negative Cardiolite.  12. DEXA scan August 2009 normal  13. Immunizations: Influenza: He did receive this fall/2014 here --Given here 06/11/2014. Given here 06/05/2015. Given here 04/08/2016 Pneumovax was given here October 2012. Gave  Prevnar 13--given here  06/14/2013. Tetanus vaccine:  T dapGiven here 11/2013 Zostavax: At prior OV: Told him to check with insurance regarding cost and let us know if wants RX sent t receive this.   At [place] 01/10/2015: Says that he forgot to check on the cost but says that he has Medicare and a supplemental insurance. As even if they do not cover he is agreeable to pay the $200 to prevent shingles.                                       Says to go ahead and send prescription to his pharmacy so I had Margreta Journey do so today.  At [place] 06/05/15--he reports that he did go to the pharmacy and received this vaccine  ROV 6 months or sooner as needed.  Signed, [address] [place], [place], [facility] 04/08/2016 8:33 AM

## 2016-04-08 NOTE — Addendum Note (Signed)
Addended by: Vonna Kotyk A on: 04/08/2016 08:59 AM   Modules accepted: Orders

## 2016-04-09 ENCOUNTER — Other Ambulatory Visit: Payer: Self-pay

## 2016-04-09 LAB — HEMOGLOBIN A1C
Hgb A1c MFr Bld: 5.9 % — ABNORMAL HIGH (ref <5.7)
Mean Plasma Glucose: 123 mg/dL

## 2016-05-20 DIAGNOSIS — H35373 Puckering of macula, bilateral: Secondary | ICD-10-CM | POA: Diagnosis not present

## 2016-05-20 DIAGNOSIS — H34212 Partial retinal artery occlusion, left eye: Secondary | ICD-10-CM | POA: Diagnosis not present

## 2016-05-20 DIAGNOSIS — Z961 Presence of intraocular lens: Secondary | ICD-10-CM | POA: Diagnosis not present

## 2016-05-20 DIAGNOSIS — H31093 Other chorioretinal scars, bilateral: Secondary | ICD-10-CM | POA: Diagnosis not present

## 2016-05-27 ENCOUNTER — Telehealth: Payer: Self-pay

## 2016-05-27 NOTE — Telephone Encounter (Signed)
Prior Authorization started for the Advair Diskus 500-60mcg aerosol powder

## 2016-06-02 NOTE — Telephone Encounter (Signed)
Prior Authoriation was canceled  because Pt does not hve prescription drug benefits with [employer]

## 2016-07-10 ENCOUNTER — Other Ambulatory Visit: Payer: Self-pay | Admitting: Physician Assistant

## 2016-07-12 ENCOUNTER — Other Ambulatory Visit: Payer: Self-pay | Admitting: Physician Assistant

## 2016-07-13 NOTE — Telephone Encounter (Signed)
Rx filled per protocol  

## 2016-07-14 ENCOUNTER — Other Ambulatory Visit: Payer: Self-pay | Admitting: Physician Assistant

## 2016-07-14 NOTE — Telephone Encounter (Signed)
rx filled per protocol  

## 2016-09-23 ENCOUNTER — Other Ambulatory Visit: Payer: Self-pay | Admitting: Cardiovascular Disease

## 2016-09-23 DIAGNOSIS — I10 Essential (primary) hypertension: Secondary | ICD-10-CM

## 2016-09-23 DIAGNOSIS — R0602 Shortness of breath: Secondary | ICD-10-CM

## 2016-09-24 ENCOUNTER — Other Ambulatory Visit: Payer: Self-pay | Admitting: Physician Assistant

## 2016-09-24 NOTE — Telephone Encounter (Signed)
Medication refill for one time only.  Patient needs to be seen.  Letter sent for patient to call and schedule 

## 2016-10-12 ENCOUNTER — Ambulatory Visit (INDEPENDENT_AMBULATORY_CARE_PROVIDER_SITE_OTHER): Payer: PPO | Admitting: Physician Assistant

## 2016-10-12 ENCOUNTER — Encounter: Payer: Self-pay | Admitting: Physician Assistant

## 2016-10-12 VITALS — BP 110/82 | HR 100 | Temp 97.5°F | Wt 192.8 lb

## 2016-10-12 DIAGNOSIS — R972 Elevated prostate specific antigen [PSA]: Secondary | ICD-10-CM

## 2016-10-12 DIAGNOSIS — E559 Vitamin D deficiency, unspecified: Secondary | ICD-10-CM | POA: Diagnosis not present

## 2016-10-12 DIAGNOSIS — R739 Hyperglycemia, unspecified: Secondary | ICD-10-CM | POA: Diagnosis not present

## 2016-10-12 DIAGNOSIS — I480 Paroxysmal atrial fibrillation: Secondary | ICD-10-CM | POA: Diagnosis not present

## 2016-10-12 DIAGNOSIS — J439 Emphysema, unspecified: Secondary | ICD-10-CM

## 2016-10-12 DIAGNOSIS — F17200 Nicotine dependence, unspecified, uncomplicated: Secondary | ICD-10-CM

## 2016-10-12 DIAGNOSIS — I739 Peripheral vascular disease, unspecified: Secondary | ICD-10-CM

## 2016-10-12 DIAGNOSIS — I10 Essential (primary) hypertension: Secondary | ICD-10-CM | POA: Diagnosis not present

## 2016-10-12 DIAGNOSIS — E785 Hyperlipidemia, unspecified: Secondary | ICD-10-CM | POA: Diagnosis not present

## 2016-10-12 NOTE — Progress Notes (Addendum)
Patient ID: Antonio Wise MRN: [id number], DOB: 1941/01/16, 76 y.o. Date of Encounter: @DATE @  Chief Complaint:  Chief Complaint  Patient presents with  . Hypertension    f/u    HPI: 76 y.o. year old white male  presents for routine followup.  His wife had seen me as a patient in the past also---she died from cancer 2014/02/03.  At office visit 01/2015 we discussed the fact that one of his daughters lives with him and another daughter lives across the street from him.    He had a OV with me on 04/06/13 for evaluation of COPD.  He had PFTs.  At that visit I prescribed Chantix and also prescribed Symbicort.  Considered adding Spiriva but cost has been an issue.  Recently, he hs just seeing Dr. Gwenlyn Found just once a year.   Also still sees Urology once a year-- 04/08/2016---says he just had a visit there with them last week and again they said everything looked okay and can wait one year for follow-up.   No complaints today. He is taking blood pressure medications as directed. He is taking cholesterol medication as directed. No myalgias or other adverse effects. Has decreased his smoking a lot and now is only smoking about 6 cigarettes per week. Those multiple days without any cigarettes at all. Notes that at lab 10/17 HCT was discontinued secondary to BUN creatinine and GFR. Again today is requesting samples of breathing medications. Says that once he gets in the doughnut hole things are very expensive. At [place] 10/12/2016--gave him samples of Breo and Xarelto   Past Medical History:  Diagnosis Date  . Colon polyp 05/06/2008   repeat 5 years  . COPD (chronic obstructive pulmonary disease) ([place])   . Duodenal ulcer 05/06/2008   EGD  . Elevated PSA   . Gastric ulcer 05/06/2008   EGD  . H/O Helicobacter infection 05/06/2008   EGD  . History of nuclear stress test 02/19/2010   bruce protocol; mild-mod perfusion defect in spetal region consistent with attenuation artifact; no  significant ischemia demonstrated; dysrhythmia during exercise  . Hyperlipidemia   . Hypertension   . Paroxysmal atrial fibrillation (HCC)   . Paroxysmal atrial fibrillation (HCC)   . Peripheral vascular disease ([place])   . Smoker unmotivated to quit   . Vitamin D deficiency      Home Meds:  Outpatient Medications Prior to Visit  Medication Sig Dispense Refill  . ADVAIR DISKUS 500-50 MCG/DOSE AEPB INHALE 1 PUFF BY MOUTH DAILY 60 each 0  . atorvastatin (LIPITOR) 40 MG tablet Take 1 tablet (40 mg total) by mouth daily at 6 PM. 90 tablet 3  . benazepril (LOTENSIN) 40 MG tablet TAKE 1 TABLET BY MOUTH DAILY 90 tablet 0  . cloNIDine (CATAPRES) 0.1 MG tablet TAKE 1 TABLET BY MOUTH TWICE DAILY 180 tablet 1  . diltiazem (CARDIZEM CD) 240 MG 24 hr capsule Take 1 capsule (240 mg total) by mouth daily. 90 capsule 3  . Multiple Minerals-Vitamins (CALCIUM & VIT D3 BONE HEALTH PO) Take 1 tablet by mouth daily.    . propafenone (RYTHMOL) 225 MG tablet TAKE 1 TABLET BY MOUTH TWICE DAILY 60 tablet 0  . XARELTO 20 MG TABS tablet TAKE 1 TABLET(20 MG) BY MOUTH DAILY WITH SUPPER 30 tablet 3   No facility-administered medications prior to visit.      Allergies:  Allergies  Allergen Reactions  . Codeine     Social History   Social History  .  Marital status: Married    Spouse name: N/A  . Number of children: 3  . Years of education: N/A   Occupational History  .  [employer]   Social History Main Topics  . Smoking status: Current Some Day Smoker    Packs/day: 0.20    Types: Cigarettes    Last attempt to quit: 05/17/2013  . Smokeless tobacco: Never Used  . Alcohol use No  . Drug use: No  . Sexual activity: Not on file   Other Topics Concern  . Not on file   Social History Narrative   Entered 06/2014:   His wife was also a patient of mine ([facility])   She passed away in 2015--secondary to ongoing smoker, lung cancer.    At [place] 06/2104 pt reports that one of their kids was already  living with them--so he is not alone, even now .    Family History  Problem Relation Age of Onset  . Heart disease Mother   . COPD Father   . Cancer Sister 83    Breast Cancer  . Diabetes Brother   . Cancer Father      Review of Systems:  See HPI for pertinent ROS. All other ROS negative.    Physical Exam: Blood pressure 110/82, pulse 100, temperature 97.5 F (36.4 C), temperature source Oral, weight 192 lb 12.8 oz (87.5 kg), SpO2 98 %., Body mass index is 25.44 kg/m. General: WNWD WM. Appears in no acute distress. Neck: Supple. No thyromegaly. No lymphadenopathy. No carotid bruits. Lungs: Breath sounds are decreased and distant. However there are no active wheezes. Heart: RRR with S1 S2. No murmurs, rubs, or gallops. Abdomen: Soft, non-tender, non-distended with normoactive bowel sounds. No hepatomegaly. No rebound/guarding. No obvious abdominal masses. Musculoskeletal:  Strength and tone normal for age. Extremities/Skin: Warm and dry. No LE edema.  Neuro: Alert and oriented X 3. Moves all extremities spontaneously. Gait is normal. CNII-XII grossly in tact. Psych:  Responds to questions appropriately with a normal affect.     ASSESSMENT AND PLAN:  76 y.o. year old male with  At [place] 10/12/2016--gave him samples of Breo and Xarelto--Check for Samples at Each OV  1. Hypertension Blood pressure is at goal. Continue current medications. Check labs monitor. - COMPLETE METABOLIC PANEL WITH GFR   2. Hyperlipidemia On Lipitor . Due to recheck now. - COMPLETE METABOLIC PANEL WITH GFR - Lipid Panel   3. Vitamin D deficiency Last  vitamin D was normal 06/14/13.  4. Smoker --has almost completely quit 04/2013--He was motivated to quit finally- Given recent DOT physical info and fact that his wife, a heavy smoker, had lung cancer (has passed away 10/11/2013).  In past, his PAD was not motivation enough.  Because of wife's death and "his nerves" he did start back smoking but is  motivated to quit--again. At [place] 01/2015 was smoking about one half pack per day but was agreeable to use Chantix again. At [place] 01/2015 a printed him to suffer prescriptions. One for the starting dose pack and one for the continuing Dosepaks with several refills on that one.  At [place] 05/2015 I discussed with him whether he feels that he needs to get back on the Chantix to make sure he does not start back to routine smoking. He does not feel that he needs the Chantix but I told him to call me if he finds himself smoking more. At [place] 04/08/2016---smoking 8 - 10 cig per day At [place] 4/9/2-018---down to 6  cigarettes per week--Goes multiple days with no cigarette!!  5. Peripheral vascular disease Managed by Dr. Gwenlyn Found - COMPLETE METABOLIC PANEL WITH GFR - Lipid Panel - Hemoglobin A1c   6. Paroxysmal atrial fibrillation Managed by Dr. Gwenlyn Found  7. Elevated PSA History of negative biopsy. Sees urology annually.   8. COPD (chronic obstructive pulmonary disease) 06/05/15 changed Symbicort to Advair secondary to insurance coverage. Continue the smoking cessation.   9. Screening for colorectal cancer had colonoscopy  November 2009. It showed polyp. Repeat 5 years. Had Colonoscopy November 2015--3 polyps--f/u 5 years.    10. Hyperglycemia - Hemoglobin A1c  11. 04/08/2016--GFR 36---HCTZ D/Ced --- monitor renal function   12. History of negative Cardiolite.  13. DEXA scan August 2009 normal  14. Immunizations: Influenza: He did receive this fall/2014 here --Given here 06/11/2014. Given here 06/05/2015. Given here 04/08/2016 Pneumovax was given here October 2012. Gave  Prevnar 13--given here  06/14/2013. Tetanus vaccine:  T dapGiven here 11/2013 Zostavax: At prior OV: Told him to check with insurance regarding cost and let us know if wants RX sent t receive this.   At [place] 01/10/2015: Says that he forgot to check on the cost but says that he has Medicare and a supplemental insurance. As even if they do not cover  he is agreeable to pay the $200 to prevent shingles.                                       Says to go ahead and send prescription to his pharmacy so I had Margreta Journey do so today.  At [place] 06/05/15--he reports that he did go to the pharmacy and received this vaccine  ROV 6 months or sooner as needed.  Marin Olp [place], [place], [facility] 10/12/2016 8:26 AM

## 2016-10-13 LAB — COMPLETE METABOLIC PANEL WITH GFR
ALBUMIN: 4.5 g/dL (ref 3.6–5.1)
ALT: 13 U/L (ref 9–46)
AST: 14 U/L (ref 10–35)
Alkaline Phosphatase: 73 U/L (ref 40–115)
BUN: 17 mg/dL (ref 7–25)
CALCIUM: 9.5 mg/dL (ref 8.6–10.3)
CHLORIDE: 105 mmol/L (ref 98–110)
CO2: 27 mmol/L (ref 20–31)
Creat: 1.32 mg/dL — ABNORMAL HIGH (ref 0.70–1.18)
GFR, Est African American: 61 mL/min (ref >=60)
GFR, Est Non African American: 53 mL/min — ABNORMAL LOW (ref >=60)
Glucose, Bld: 111 mg/dL — ABNORMAL HIGH (ref 70–99)
Potassium: 4.9 mmol/L (ref 3.5–5.3)
Sodium: 144 mmol/L (ref 135–146)
Total Bilirubin: 0.6 mg/dL (ref 0.2–1.2)
Total Protein: 7 g/dL (ref 6.1–8.1)

## 2016-10-13 LAB — HEMOGLOBIN A1C
HEMOGLOBIN A1C: 5.6 % (ref <5.7)
Mean Plasma Glucose: 114 mg/dL

## 2016-10-13 LAB — LIPID PANEL
Cholesterol: 148 mg/dL (ref <200)
HDL: 43 mg/dL (ref >40)
LDL CALC: 78 mg/dL (ref <100)
Total CHOL/HDL Ratio: 3.4 Ratio (ref <5.0)
Triglycerides: 136 mg/dL (ref <150)
VLDL: 27 mg/dL (ref <30)

## 2016-10-13 LAB — VITAMIN D 25 HYDROXY (VIT D DEFICIENCY, FRACTURES): VIT D 25 HYDROXY: 21 ng/mL — AB (ref 30–100)

## 2016-10-14 ENCOUNTER — Other Ambulatory Visit: Payer: Self-pay

## 2016-10-14 MED ORDER — CHOLECALCIFEROL 100 MCG (4000 UT) PO CAPS: 1.0000 | ORAL_CAPSULE | Freq: Every day | ORAL | 3 refills | Status: AC

## 2016-10-15 ENCOUNTER — Other Ambulatory Visit: Payer: Self-pay | Admitting: Physician Assistant

## 2016-10-26 ENCOUNTER — Other Ambulatory Visit: Payer: Self-pay | Admitting: Physician Assistant

## 2016-10-26 NOTE — Telephone Encounter (Signed)
Refill appropriate 

## 2016-11-16 ENCOUNTER — Other Ambulatory Visit: Payer: Self-pay | Admitting: Cardiovascular Disease

## 2016-11-16 NOTE — Telephone Encounter (Signed)
REFILL 

## 2016-12-21 ENCOUNTER — Other Ambulatory Visit: Payer: Self-pay | Admitting: Cardiovascular Disease

## 2016-12-23 ENCOUNTER — Other Ambulatory Visit: Payer: Self-pay | Admitting: Cardiovascular Disease

## 2016-12-23 NOTE — Telephone Encounter (Signed)
New message     *STAT* If patient is at the pharmacy, call can be transferred to refill team.   1. Which medications need to be refilled? (please list name of each medication and dose if known) cardia 240 mg  2. Which pharmacy/location (including street and city if local pharmacy) is medication to be sent to? [facility] on [address]   3. Do they need a 30 day or 90 day supply? 90 day

## 2016-12-23 NOTE — Telephone Encounter (Signed)
Spoke with staff at [facility] Patient is at pharmacy and wants 90 day supply for [place]. Advised was refilled earlier today for 30 day supply as patient is due for appt with MD OK for 90 day supply but will need to call for an appt She will inform patient.

## 2017-01-12 ENCOUNTER — Other Ambulatory Visit: Payer: Self-pay | Admitting: Physician Assistant

## 2017-02-01 ENCOUNTER — Other Ambulatory Visit: Payer: Self-pay | Admitting: Cardiovascular Disease

## 2017-02-01 DIAGNOSIS — R0602 Shortness of breath: Secondary | ICD-10-CM

## 2017-02-01 DIAGNOSIS — I10 Essential (primary) hypertension: Secondary | ICD-10-CM

## 2017-02-03 ENCOUNTER — Ambulatory Visit (INDEPENDENT_AMBULATORY_CARE_PROVIDER_SITE_OTHER): Payer: PPO | Admitting: Cardiovascular Disease

## 2017-02-03 ENCOUNTER — Encounter: Payer: Self-pay | Admitting: Cardiovascular Disease

## 2017-02-03 VITALS — BP 140/82 | HR 100 | Ht 73.0 in | Wt 188.2 lb

## 2017-02-03 DIAGNOSIS — I480 Paroxysmal atrial fibrillation: Secondary | ICD-10-CM | POA: Diagnosis not present

## 2017-02-03 DIAGNOSIS — F17200 Nicotine dependence, unspecified, uncomplicated: Secondary | ICD-10-CM | POA: Diagnosis not present

## 2017-02-03 DIAGNOSIS — I739 Peripheral vascular disease, unspecified: Secondary | ICD-10-CM

## 2017-02-03 DIAGNOSIS — I10 Essential (primary) hypertension: Secondary | ICD-10-CM

## 2017-02-03 DIAGNOSIS — E7800 Pure hypercholesterolemia, unspecified: Secondary | ICD-10-CM

## 2017-02-03 NOTE — Assessment & Plan Note (Signed)
History of hyperlipidemia on statin therapy with recent lipid profile performed 10/12/16 revealed total cholesterol 140, LDL 70 and HDL of 43.

## 2017-02-03 NOTE — Assessment & Plan Note (Signed)
History of atrial fibrillation now persistent, rate controlled on oral anticoagulation. We will discontinue his Rythmol.

## 2017-02-03 NOTE — Assessment & Plan Note (Signed)
History of peripheral arterial disease status post left common iliac artery stenting by myself 08/19/04. He currently denies claudication. His last Dopplers performed 4 years ago showed a widely patent stent.

## 2017-02-03 NOTE — Progress Notes (Signed)
02/03/2017 Antonio Wise   December 10, 1940  [id number]  Primary Physician [name], PA-C Primary Cardiologist: Lorretta Harp MD Garret Reddish, [place], [place]  HPI:  Antonio Wise is a 76 y.o. male who I last saw him 11/07/15.Marland Kitchen He is a 76 year old, mildly overweight, married Caucasian male, father of three, grandfather to three grandchildren with a history of peripheral vascular occlusive disease status post left common iliac artery PTA and stenting by myself August 19, 2004, for claudication. His symptoms resolved after that, and his Dopplers normalized. His other problems include discontinued tobacco abuse, only smoking two cigarettes a week, which is excellent for him. He has treated hypertension, dyslipidemia, and paroxysmal atrial fibrillation maintaining sinus rhythm on Rythmol. He has not had any A fib to his knowledge since I last saw him.   Since I saw him a year and a half ago he's done well until several weeks ago when he developed right flank pain and persistent fever.He probably had an MRI of his abdomen the results of which are unavailable to me at the current time. He underwent colonoscopy and was found to have "an infection in his colon". When I saw him a year ago he was in atrial fibrillation with a rapid ventricular response. I'll change his amlodipine to Cardizem at that point. He was also begun on Xarelto oral anti-coagulation. When he saw Roderic Palau in the office soon thereafter he was in sinus rhythm however today he is back in A. fib and was unaware of this.   Current Meds  Medication Sig  . ADVAIR DISKUS 500-50 MCG/DOSE AEPB INHALE 1 PUFF BY MOUTH DAILY  . atorvastatin (LIPITOR) 40 MG tablet TAKE 1 TABLET(40 MG) BY MOUTH DAILY AT 6 PM  . benazepril (LOTENSIN) 40 MG tablet TAKE 1 TABLET BY MOUTH DAILY  . BREO ELLIPTA 100-25 MCG/INH AEPB Inhale 1 Inhaler into the lungs daily as needed.   Marland Kitchen CARTIA XT 240 MG 24 hr capsule TAKE 1 CAPSULE(240 MG) BY MOUTH DAILY  .  Cholecalciferol 4000 units CAPS Take 1 capsule (4,000 Units total) by mouth daily.  . cloNIDine (CATAPRES) 0.1 MG tablet TAKE 1 TABLET BY MOUTH TWICE DAILY  . Multiple Minerals-Vitamins (CALCIUM & VIT D3 BONE HEALTH PO) Take 1 tablet by mouth daily.  Antonio Wise 20 MG TABS tablet TAKE 1 TABLET(20 MG) BY MOUTH DAILY WITH SUPPER  . [DISCONTINUED] CARTIA XT 240 MG 24 hr capsule TAKE 1 CAPSULE(240 MG) BY MOUTH DAILY  . [DISCONTINUED] propafenone (RYTHMOL) 225 MG tablet TAKE 1 TABLET BY MOUTH TWICE DAILY     Allergies  Allergen Reactions  . Codeine     Social History   Social History  . Marital status: Married    Spouse name: N/A  . Number of children: 3  . Years of education: N/A   Occupational History  .  Antonio Wise   Social History Main Topics  . Smoking status: Current Some Day Smoker    Packs/day: 0.20    Types: Cigarettes    Last attempt to quit: 05/17/2013  . Smokeless tobacco: Never Used  . Alcohol use No  . Drug use: No  . Sexual activity: Not on file   Other Topics Concern  . Not on file   Social History Narrative   Entered 06/2014:   His wife was also a patient of mine ([facility])   She passed away in 2015--secondary to ongoing smoker, lung cancer.    At [place] 06/2104 pt reports  that one of their kids was already living with them--so he is not alone, even now .     Review of Systems: General: negative for chills, fever, night sweats or weight changes.  Cardiovascular: negative for chest pain, dyspnea on exertion, edema, orthopnea, palpitations, paroxysmal nocturnal dyspnea or shortness of breath Dermatological: negative for rash Respiratory: negative for cough or wheezing Urologic: negative for hematuria Abdominal: negative for nausea, vomiting, diarrhea, bright red blood per rectum, melena, or hematemesis Neurologic: negative for visual changes, syncope, or dizziness All other systems reviewed and are otherwise negative except as noted above.    Blood  pressure 140/82, pulse 100, height 6\' 1"  (1.854 m), weight 188 lb 3.2 oz (85.4 kg).  General appearance: alert and no distress Neck: no adenopathy, no carotid bruit, no JVD, supple, symmetrical, trachea midline and thyroid not enlarged, symmetric, no tenderness/mass/nodules Lungs: clear to auscultation bilaterally Heart: irregularly irregular rhythm Extremities: extremities normal, atraumatic, no cyanosis or edema  EKG atrial fibrillation with a ventricular response of 100, low limb voltage and Q waves in leads V1 through V4. I personally reviewed this EKG.  ASSESSMENT AND PLAN:   Hypertension History of essential hypertension blood pressure measured at 140/82. He is on Lotensin, Cartia XT 240 and clonidine. He is also on Rythmol although he is in A. fib today and was unaware of that. His ventricular response is 100. In addition, he is on Xarelto to oral anticoagulation. I'm going to discontinue the Rythmol and we will except rate control.  Hyperlipidemia History of hyperlipidemia on statin therapy with recent lipid profile performed 10/12/16 revealed total cholesterol 140, LDL 70 and HDL of 43.  Smoker unmotivated to quit History of continued tobacco use of one quarter pack per day recalcitrant risk factor modification  Peripheral vascular disease History of peripheral arterial disease status post left common iliac artery stenting by myself 08/19/04. He currently denies claudication. His last Dopplers performed 4 years ago showed a widely patent stent.  Paroxysmal atrial fibrillation History of atrial fibrillation now persistent, rate controlled on oral anticoagulation. We will discontinue his Rythmol.      Lorretta Harp MD FACP,FACC,FAHA, [facility] 02/03/2017 9:19 AM

## 2017-02-03 NOTE — Assessment & Plan Note (Signed)
History of essential hypertension blood pressure measured at 140/82. He is on Lotensin, Cartia XT 240 and clonidine. He is also on Rythmol although he is in A. fib today and was unaware of that. His ventricular response is 100. In addition, he is on Xarelto to oral anticoagulation. I'm going to discontinue the Rythmol and we will except rate control.

## 2017-02-03 NOTE — Patient Instructions (Addendum)
Medication Instructions: Your physician recommends that you continue on your current medications as directed. Please refer to the Current Medication list given to you today.  STOP Propafenone (Rhythmol)   Follow-Up: Your physician wants you to follow-up in: 1 year with Dr. Gwenlyn Found. You will receive a reminder letter in the mail two months in advance. If you don't receive a letter, please call our office to schedule the follow-up appointment.  If you need a refill on your cardiac medications before your next appointment, please call your pharmacy.

## 2017-02-03 NOTE — Assessment & Plan Note (Signed)
History of continued tobacco use of one quarter pack per day recalcitrant risk factor modification

## 2017-02-07 ENCOUNTER — Emergency Department (HOSPITAL_COMMUNITY): Payer: PPO

## 2017-02-07 ENCOUNTER — Inpatient Hospital Stay (HOSPITAL_COMMUNITY)
Admission: EM | Admit: 2017-02-07 | Discharge: 2017-02-11 | DRG: 190 | Disposition: A | Discharge status: Home / Self Care | Payer: PPO | Attending: Family Medicine | Admitting: Family Medicine

## 2017-02-07 ENCOUNTER — Encounter (HOSPITAL_COMMUNITY): Payer: Self-pay | Admitting: Nurse Practitioner

## 2017-02-07 DIAGNOSIS — F17210 Nicotine dependence, cigarettes, uncomplicated: Secondary | ICD-10-CM | POA: Diagnosis not present

## 2017-02-07 DIAGNOSIS — I2781 Cor pulmonale (chronic): Secondary | ICD-10-CM | POA: Diagnosis present

## 2017-02-07 DIAGNOSIS — J441 Chronic obstructive pulmonary disease with (acute) exacerbation: Secondary | ICD-10-CM | POA: Diagnosis not present

## 2017-02-07 DIAGNOSIS — Z7951 Long term (current) use of inhaled steroids: Secondary | ICD-10-CM | POA: Diagnosis not present

## 2017-02-07 DIAGNOSIS — I50810 Right heart failure, unspecified: Secondary | ICD-10-CM | POA: Diagnosis not present

## 2017-02-07 DIAGNOSIS — Z79899 Other long term (current) drug therapy: Secondary | ICD-10-CM | POA: Diagnosis not present

## 2017-02-07 DIAGNOSIS — Z885 Allergy status to narcotic agent status: Secondary | ICD-10-CM | POA: Diagnosis not present

## 2017-02-07 DIAGNOSIS — I481 Persistent atrial fibrillation: Secondary | ICD-10-CM | POA: Diagnosis not present

## 2017-02-07 DIAGNOSIS — I739 Peripheral vascular disease, unspecified: Secondary | ICD-10-CM | POA: Diagnosis present

## 2017-02-07 DIAGNOSIS — I480 Paroxysmal atrial fibrillation: Secondary | ICD-10-CM | POA: Diagnosis not present

## 2017-02-07 DIAGNOSIS — E559 Vitamin D deficiency, unspecified: Secondary | ICD-10-CM | POA: Diagnosis present

## 2017-02-07 DIAGNOSIS — I472 Ventricular tachycardia: Secondary | ICD-10-CM | POA: Diagnosis not present

## 2017-02-07 DIAGNOSIS — D72829 Elevated white blood cell count, unspecified: Secondary | ICD-10-CM | POA: Diagnosis present

## 2017-02-07 DIAGNOSIS — R739 Hyperglycemia, unspecified: Secondary | ICD-10-CM | POA: Diagnosis not present

## 2017-02-07 DIAGNOSIS — I4891 Unspecified atrial fibrillation: Secondary | ICD-10-CM

## 2017-02-07 DIAGNOSIS — J9601 Acute respiratory failure with hypoxia: Secondary | ICD-10-CM | POA: Diagnosis not present

## 2017-02-07 DIAGNOSIS — J9602 Acute respiratory failure with hypercapnia: Secondary | ICD-10-CM | POA: Diagnosis present

## 2017-02-07 DIAGNOSIS — I110 Hypertensive heart disease with heart failure: Secondary | ICD-10-CM | POA: Diagnosis not present

## 2017-02-07 DIAGNOSIS — Z95820 Peripheral vascular angioplasty status with implants and grafts: Secondary | ICD-10-CM | POA: Diagnosis not present

## 2017-02-07 DIAGNOSIS — Z7901 Long term (current) use of anticoagulants: Secondary | ICD-10-CM

## 2017-02-07 DIAGNOSIS — I10 Essential (primary) hypertension: Secondary | ICD-10-CM | POA: Diagnosis not present

## 2017-02-07 DIAGNOSIS — E785 Hyperlipidemia, unspecified: Secondary | ICD-10-CM | POA: Diagnosis present

## 2017-02-07 DIAGNOSIS — I482 Chronic atrial fibrillation, unspecified: Secondary | ICD-10-CM

## 2017-02-07 DIAGNOSIS — R000 Tachycardia, unspecified: Secondary | ICD-10-CM | POA: Diagnosis not present

## 2017-02-07 DIAGNOSIS — R0602 Shortness of breath: Secondary | ICD-10-CM | POA: Diagnosis not present

## 2017-02-07 DIAGNOSIS — E860 Dehydration: Secondary | ICD-10-CM | POA: Diagnosis present

## 2017-02-07 LAB — CBC
HEMATOCRIT: 54.8 % — AB (ref 39.0–52.0)
HEMOGLOBIN: 17.2 g/dL — AB (ref 13.0–17.0)
MCH: 31.4 pg (ref 26.0–34.0)
MCHC: 31.4 g/dL (ref 30.0–36.0)
MCV: 100 fL (ref 78.0–100.0)
Platelets: 377 10*3/uL (ref 150–400)
RBC: 5.48 MIL/uL (ref 4.22–5.81)
RDW: 13.7 % (ref 11.5–15.5)
WBC: 22.2 10*3/uL — AB (ref 4.0–10.5)

## 2017-02-07 LAB — BASIC METABOLIC PANEL
ANION GAP: 15 (ref 5–15)
BUN: 18 mg/dL (ref 6–20)
CHLORIDE: 102 mmol/L (ref 101–111)
CO2: 25 mmol/L (ref 22–32)
Calcium: 9.3 mg/dL (ref 8.9–10.3)
Creatinine, Ser: 1.26 mg/dL — ABNORMAL HIGH (ref 0.61–1.24)
GFR, EST NON AFRICAN AMERICAN: 54 mL/min — AB (ref >60)
Glucose, Bld: 192 mg/dL — ABNORMAL HIGH (ref 65–99)
POTASSIUM: 4 mmol/L (ref 3.5–5.1)
SODIUM: 142 mmol/L (ref 135–145)

## 2017-02-07 LAB — BRAIN NATRIURETIC PEPTIDE: B NATRIURETIC PEPTIDE 5: 295.9 pg/mL — AB (ref 0.0–100.0)

## 2017-02-07 LAB — I-STAT ARTERIAL BLOOD GAS, ED
ACID-BASE EXCESS: 1 mmol/L (ref 0.0–2.0)
Bicarbonate: 31.2 mmol/L — ABNORMAL HIGH (ref 20.0–28.0)
O2 SAT: 91 %
PH ART: 7.239 — AB (ref 7.350–7.450)
TCO2: 33 mmol/L (ref 0–100)
pCO2 arterial: 72.9 mmHg (ref 32.0–48.0)
pO2, Arterial: 74 mmHg — ABNORMAL LOW (ref 83.0–108.0)

## 2017-02-07 LAB — I-STAT TROPONIN, ED: TROPONIN I, POC: 0 ng/mL (ref 0.00–0.08)

## 2017-02-07 LAB — LACTIC ACID, PLASMA: Lactic Acid, Venous: 1.6 mmol/L (ref 0.5–1.9)

## 2017-02-07 LAB — I-STAT CG4 LACTIC ACID, ED: LACTIC ACID, VENOUS: 1.93 mmol/L — AB (ref 0.5–1.9)

## 2017-02-07 MED ORDER — SODIUM CHLORIDE 0.9 % IV BOLUS (SEPSIS)
1000.0000 mL | Freq: Once | INTRAVENOUS | Status: AC
  Administered 2017-02-07: 1000 mL via INTRAVENOUS

## 2017-02-07 MED ORDER — ALBUTEROL SULFATE (2.5 MG/3ML) 0.083% IN NEBU
INHALATION_SOLUTION | RESPIRATORY_TRACT | Status: AC
  Filled 2017-02-07: qty 6

## 2017-02-07 MED ORDER — DILTIAZEM LOAD VIA INFUSION
20.0000 mg | Freq: Once | INTRAVENOUS | Status: AC
  Administered 2017-02-07: 10 mg via INTRAVENOUS
  Filled 2017-02-07: qty 20

## 2017-02-07 MED ORDER — CHLORHEXIDINE GLUCONATE 0.12 % MT SOLN
15.0000 mL | Freq: Two times a day (BID) | OROMUCOSAL | Status: DC
  Administered 2017-02-07 – 2017-02-08 (×3): 15 mL via OROMUCOSAL
  Filled 2017-02-07 (×7): qty 15

## 2017-02-07 MED ORDER — METHYLPREDNISOLONE SODIUM SUCC 125 MG IJ SOLR
60.0000 mg | Freq: Four times a day (QID) | INTRAMUSCULAR | Status: DC
  Administered 2017-02-07 – 2017-02-08 (×3): 60 mg via INTRAVENOUS
  Filled 2017-02-07 (×3): qty 2

## 2017-02-07 MED ORDER — CEFTRIAXONE SODIUM 1 G IJ SOLR
1.0000 g | Freq: Once | INTRAMUSCULAR | Status: AC
  Administered 2017-02-07: 1 g via INTRAVENOUS
  Filled 2017-02-07: qty 10

## 2017-02-07 MED ORDER — ALBUTEROL SULFATE (2.5 MG/3ML) 0.083% IN NEBU
5.0000 mg | INHALATION_SOLUTION | Freq: Once | RESPIRATORY_TRACT | Status: AC
  Administered 2017-02-07: 5 mg via RESPIRATORY_TRACT

## 2017-02-07 MED ORDER — FLUTICASONE FUROATE-VILANTEROL 100-25 MCG/INH IN AEPB: 1.0000 | INHALATION_SPRAY | Freq: Every day | RESPIRATORY_TRACT | Status: DC | PRN

## 2017-02-07 MED ORDER — ACETAMINOPHEN 650 MG RE SUPP: 650.0000 mg | Freq: Four times a day (QID) | RECTAL | Status: DC | PRN

## 2017-02-07 MED ORDER — ORAL CARE MOUTH RINSE
15.0000 mL | Freq: Two times a day (BID) | OROMUCOSAL | Status: DC
  Administered 2017-02-08: 15 mL via OROMUCOSAL

## 2017-02-07 MED ORDER — DEXTROSE 5 % IV SOLN
500.0000 mg | INTRAVENOUS | Status: DC
  Administered 2017-02-08 – 2017-02-10 (×3): 500 mg via INTRAVENOUS
  Filled 2017-02-07 (×4): qty 500

## 2017-02-07 MED ORDER — CLONIDINE HCL 0.1 MG PO TABS
0.1000 mg | ORAL_TABLET | Freq: Two times a day (BID) | ORAL | Status: DC
  Administered 2017-02-07 – 2017-02-08 (×2): 0.1 mg via ORAL
  Filled 2017-02-07 (×2): qty 1

## 2017-02-07 MED ORDER — ALBUTEROL SULFATE (2.5 MG/3ML) 0.083% IN NEBU
2.5000 mg | INHALATION_SOLUTION | Freq: Once | RESPIRATORY_TRACT | Status: AC
  Administered 2017-02-07: 2.5 mg via RESPIRATORY_TRACT
  Filled 2017-02-07: qty 3

## 2017-02-07 MED ORDER — MOMETASONE FURO-FORMOTEROL FUM 200-5 MCG/ACT IN AERO: 2.0000 | INHALATION_SPRAY | Freq: Two times a day (BID) | RESPIRATORY_TRACT | Status: DC

## 2017-02-07 MED ORDER — IPRATROPIUM-ALBUTEROL 0.5-2.5 (3) MG/3ML IN SOLN
3.0000 mL | Freq: Once | RESPIRATORY_TRACT | Status: AC
  Administered 2017-02-07: 3 mL via RESPIRATORY_TRACT
  Filled 2017-02-07: qty 3

## 2017-02-07 MED ORDER — DEXTROSE 5 % IV SOLN
500.0000 mg | Freq: Once | INTRAVENOUS | Status: AC
  Administered 2017-02-07: 500 mg via INTRAVENOUS
  Filled 2017-02-07: qty 500

## 2017-02-07 MED ORDER — BENAZEPRIL HCL 40 MG PO TABS
40.0000 mg | ORAL_TABLET | Freq: Every day | ORAL | Status: DC
  Administered 2017-02-07: 40 mg via ORAL
  Filled 2017-02-07: qty 4
  Filled 2017-02-07 (×2): qty 1

## 2017-02-07 MED ORDER — ONDANSETRON HCL 4 MG/2ML IJ SOLN: 4.0000 mg | Freq: Four times a day (QID) | INTRAMUSCULAR | Status: DC | PRN

## 2017-02-07 MED ORDER — ATORVASTATIN CALCIUM 40 MG PO TABS
40.0000 mg | ORAL_TABLET | Freq: Every day | ORAL | Status: DC
  Administered 2017-02-08 – 2017-02-10 (×3): 40 mg via ORAL
  Filled 2017-02-07 (×4): qty 1

## 2017-02-07 MED ORDER — METHYLPREDNISOLONE SODIUM SUCC 125 MG IJ SOLR
125.0000 mg | INTRAMUSCULAR | Status: AC
  Administered 2017-02-07: 125 mg via INTRAVENOUS
  Filled 2017-02-07: qty 2

## 2017-02-07 MED ORDER — LEVALBUTEROL HCL 0.63 MG/3ML IN NEBU
0.6300 mg | INHALATION_SOLUTION | RESPIRATORY_TRACT | Status: DC
  Administered 2017-02-07 (×2): 0.63 mg via RESPIRATORY_TRACT
  Filled 2017-02-07 (×2): qty 3

## 2017-02-07 MED ORDER — FAMOTIDINE IN NACL 20-0.9 MG/50ML-% IV SOLN: 20.0000 mg | Freq: Two times a day (BID) | INTRAVENOUS | Status: DC

## 2017-02-07 MED ORDER — DILTIAZEM HCL 100 MG IV SOLR
5.0000 mg/h | INTRAVENOUS | Status: DC
  Administered 2017-02-07: 5 mg/h via INTRAVENOUS
  Administered 2017-02-08: 15 mg/h via INTRAVENOUS
  Filled 2017-02-07 (×3): qty 100

## 2017-02-07 MED ORDER — LEVALBUTEROL HCL 0.63 MG/3ML IN NEBU
0.6300 mg | INHALATION_SOLUTION | Freq: Four times a day (QID) | RESPIRATORY_TRACT | Status: DC
  Administered 2017-02-08: 0.63 mg via RESPIRATORY_TRACT
  Filled 2017-02-07: qty 3

## 2017-02-07 MED ORDER — NICOTINE 14 MG/24HR TD PT24
14.0000 mg | MEDICATED_PATCH | Freq: Every day | TRANSDERMAL | Status: DC
  Administered 2017-02-08 – 2017-02-11 (×4): 14 mg via TRANSDERMAL
  Filled 2017-02-07 (×4): qty 1

## 2017-02-07 MED ORDER — ACETAMINOPHEN 325 MG PO TABS
650.0000 mg | ORAL_TABLET | Freq: Four times a day (QID) | ORAL | Status: DC | PRN
  Administered 2017-02-07: 650 mg via ORAL
  Filled 2017-02-07: qty 2

## 2017-02-07 MED ORDER — FLUTICASONE FUROATE-VILANTEROL 100-25 MCG/INH IN AEPB
1.0000 | INHALATION_SPRAY | Freq: Every day | RESPIRATORY_TRACT | Status: DC
  Administered 2017-02-08 – 2017-02-11 (×4): 1 via RESPIRATORY_TRACT
  Filled 2017-02-07: qty 28

## 2017-02-07 MED ORDER — POLYETHYLENE GLYCOL 3350 17 G PO PACK: 17.0000 g | PACK | Freq: Every day | ORAL | Status: DC | PRN

## 2017-02-07 MED ORDER — RIVAROXABAN 20 MG PO TABS
20.0000 mg | ORAL_TABLET | Freq: Every day | ORAL | Status: DC
  Administered 2017-02-07 – 2017-02-10 (×4): 20 mg via ORAL
  Filled 2017-02-07 (×4): qty 1

## 2017-02-07 MED ORDER — DILTIAZEM HCL ER COATED BEADS 240 MG PO CP24: 240.0000 mg | ORAL_CAPSULE | Freq: Every day | ORAL | Status: DC

## 2017-02-07 MED ORDER — VITAMIN D 1000 UNITS PO TABS
4000.0000 [IU] | ORAL_TABLET | Freq: Every day | ORAL | Status: DC
  Administered 2017-02-08 – 2017-02-11 (×4): 4000 [IU] via ORAL
  Filled 2017-02-07 (×4): qty 4

## 2017-02-07 MED ORDER — ONDANSETRON HCL 4 MG PO TABS: 4.0000 mg | ORAL_TABLET | Freq: Four times a day (QID) | ORAL | Status: DC | PRN

## 2017-02-07 MED ORDER — DEXTROSE 5 % IV SOLN
1.0000 g | INTRAVENOUS | Status: DC
  Administered 2017-02-08 – 2017-02-10 (×3): 1 g via INTRAVENOUS
  Filled 2017-02-07 (×4): qty 10

## 2017-02-07 MED ORDER — IPRATROPIUM-ALBUTEROL 0.5-2.5 (3) MG/3ML IN SOLN: 3.0000 mL | Freq: Four times a day (QID) | RESPIRATORY_TRACT | Status: DC

## 2017-02-07 MED ORDER — SODIUM CHLORIDE 0.9 % IV SOLN
10.0000 mg | Freq: Two times a day (BID) | INTRAVENOUS | Status: DC
  Administered 2017-02-07 – 2017-02-08 (×2): 10 mg via INTRAVENOUS
  Filled 2017-02-07 (×2): qty 1

## 2017-02-07 NOTE — Progress Notes (Signed)
Pt states that he is hungry. RN aware. Pt is NPO per RN.

## 2017-02-07 NOTE — ED Provider Notes (Signed)
[facility] DEPT Provider Note   CSN: [id number] Arrival date & time: 02/07/17  1513     History   Chief Complaint Chief Complaint  Patient presents with  . Shortness of Breath      HPI Antonio Wise is a 76 y.o. Male. With history of COPD, pAF on xarelto, HTN presents with shortness of breath. Patient reports he is at increased sputum production, cough, and shortness of breath the past 3 days. He was seen by his cardiologist on Wednesday, when his Rythmol was discontinued. He denies any chest pain, leg swelling, fevers, chills.   Past Medical History:  Diagnosis Date  . Colon polyp 05/06/2008   repeat 5 years  . COPD (chronic obstructive pulmonary disease) ([place])   . Duodenal ulcer 05/06/2008   EGD  . Elevated PSA   . Gastric ulcer 05/06/2008   EGD  . H/O Helicobacter infection 05/06/2008   EGD  . History of nuclear stress test 02/19/2010   bruce protocol; mild-mod perfusion defect in spetal region consistent with attenuation artifact; no significant ischemia demonstrated; dysrhythmia during exercise  . Hyperlipidemia   . Hypertension   . Paroxysmal atrial fibrillation (HCC)   . Paroxysmal atrial fibrillation (HCC)   . Peripheral vascular disease ([place])   . Smoker unmotivated to quit   . Vitamin D deficiency     Patient Active Problem List   Diagnosis Date Noted  . Atrial fibrillation with RVR ([place]) 02/07/2017  . Acute respiratory failure with hypoxia and hypercapnia ([place]) 02/07/2017  . Light smoker 06/05/2015  . Screening for colorectal cancer 06/14/2013  . Hyperglycemia 06/14/2013  . Hypertension   . Hyperlipidemia   . Vitamin D deficiency   . Smoker unmotivated to quit   . Peripheral vascular disease ([place])   . Paroxysmal atrial fibrillation (HCC)   . Elevated PSA   . COPD with acute exacerbation [facility])     Past Surgical History:  Procedure Laterality Date  . ILIAC VEIN ANGIOPLASTY / STENTING  05/19/2005   left CIA PTA & stenting   . TRANSTHORACIC  ECHOCARDIOGRAM  06/10/2005   LV hyperdynamic; borderline LA enlargement; trace MR/TR/AVR       Home Medications    Prior to Admission medications   Medication Sig Start Date End Date Taking? Authorizing Provider  ADVAIR DISKUS 500-50 MCG/DOSE AEPB INHALE 1 PUFF BY MOUTH DAILY 07/10/16  Yes Dena Billet B, PA-C  atorvastatin (LIPITOR) 40 MG tablet TAKE 1 TABLET(40 MG) BY MOUTH DAILY AT 6 PM 11/16/16  Yes Lorretta Harp, MD  benazepril (LOTENSIN) 40 MG tablet TAKE 1 TABLET BY MOUTH DAILY 01/12/17  Yes Dena Billet B, PA-C  CARTIA XT 240 MG 24 hr capsule TAKE 1 CAPSULE(240 MG) BY MOUTH DAILY 12/23/16  Yes Lorretta Harp, MD  cloNIDine (CATAPRES) 0.1 MG tablet TAKE 1 TABLET BY MOUTH TWICE DAILY 07/13/16  Yes Dena Billet B, PA-C  fluticasone furoate-vilanterol (BREO ELLIPTA) 100-25 MCG/INH AEPB Inhale 1 puff into the lungs daily as needed (shortness of breath).   Yes [provider]  Multiple Minerals-Vitamins (CALCIUM & VIT D3 BONE HEALTH PO) Take 1 tablet by mouth daily.   Yes [provider]  XARELTO 20 MG TABS tablet TAKE 1 TABLET(20 MG) BY MOUTH DAILY WITH SUPPER 02/01/17  Yes Lorretta Harp, MD  Cholecalciferol 4000 units CAPS Take 1 capsule (4,000 Units total) by mouth daily. 10/14/16   [name], PA-C    Family History Family History  Problem Relation Age of Onset  .  Heart disease Mother   . COPD Father   . Cancer Father   . Cancer Sister 67       Breast Cancer  . Diabetes Brother     Social History Social History  Substance Use Topics  . Smoking status: Current Some Day Smoker    Packs/day: 0.20    Types: Cigarettes    Last attempt to quit: 05/17/2013  . Smokeless tobacco: Never Used  . Alcohol use No     Allergies   Codeine   Review of Systems Review of Systems  Constitutional: Negative for chills and fever.  HENT: Negative for ear pain and sore throat.   Eyes: Negative for pain and visual disturbance.  Respiratory: Positive for cough and  shortness of breath.        Increased sputum production  Cardiovascular: Negative for chest pain and palpitations.  Gastrointestinal: Negative for abdominal pain and vomiting.  Genitourinary: Negative for dysuria and hematuria.  Musculoskeletal: Negative for arthralgias and back pain.  Skin: Negative for color change and rash.  Neurological: Negative for seizures and syncope.  All other systems reviewed and are negative.    Physical Exam Updated Vital Signs BP (!) 162/89   Pulse (!) 142   Temp 98.3 F (36.8 C) (Oral)   Resp (!) 23   SpO2 90%   Physical Exam  Constitutional: He appears well-developed and well-nourished. He appears distressed.  HENT:  Head: Normocephalic and atraumatic.  Eyes: Conjunctivae are normal.  Neck: Neck supple.  Cardiovascular: Normal rate.   No murmur heard. tachycardic  Pulmonary/Chest: He is in respiratory distress. He has wheezes. He has rales.  Abdominal: Soft. There is no tenderness. There is no guarding.  Musculoskeletal: He exhibits no edema.  Neurological: He is alert.  Skin: Skin is warm and dry.  Psychiatric: He has a normal mood and affect.  Nursing note and vitals reviewed.    ED Treatments / Results  Labs (all labs ordered are listed, but only abnormal results are displayed) Labs Reviewed  BASIC METABOLIC PANEL - Abnormal; Notable for the following:       Result Value   Glucose, Bld 192 (*)    Creatinine, Ser 1.26 (*)    GFR calc non Af Amer 54 (*)    All other components within normal limits  CBC - Abnormal; Notable for the following:    WBC 22.2 (*)    Hemoglobin 17.2 (*)    HCT 54.8 (*)    All other components within normal limits  BRAIN NATRIURETIC PEPTIDE - Abnormal; Notable for the following:    B Natriuretic Peptide 295.9 (*)    All other components within normal limits  CULTURE, BLOOD (ROUTINE X 2)  CULTURE, BLOOD (ROUTINE X 2)  BASIC METABOLIC PANEL  CBC  I-STAT TROPONIN, ED    EKG  EKG  Interpretation  Date/Time:  Sunday February 07 2017 15:15:30 EDT Ventricular Rate:  161 PR Interval:    QRS Duration: 74 QT Interval:  240 QTC Calculation: 392 R Axis:   100 Text Interpretation:  Atrial fibrillation with rapid ventricular response with premature ventricular or aberrantly conducted complexes Anterolateral infarct , age undetermined ST & T wave abnormality, consider inferior ischemia or digitalis effect Abnormal ECG Since last tracing afib now present with rapid rate Confirmed by Noemi Chapel [phone]) on 02/07/2017 4:00:34 PM       Radiology Dg Chest Portable 1 View  Result Date: 02/07/2017 CLINICAL DATA:  Shortness of breath for 3 days. EXAM:  PORTABLE CHEST 1 VIEW COMPARISON:  CT chest 03/11/2007.  PA and lateral chest 02/29/2008. FINDINGS: The chest is hyperexpanded with attenuation of the pulmonary vasculature. Lungs are clear. Heart size is normal. Aortic atherosclerosis noted. No pneumothorax or pleural effusion. IMPRESSION: No acute disease. Findings compatible with COPD. Atherosclerosis. Electronically Signed   By: Inge Rise M.D.   On: 02/07/2017 17:02    Procedures Procedures (including critical care time)  Medications Ordered in ED Medications  diltiazem (CARDIZEM) 1 mg/mL load via infusion 20 mg (10 mg Intravenous Bolus from Bag 02/07/17 1705)    And  diltiazem (CARDIZEM) 100 mg in dextrose 5 % 100 mL (1 mg/mL) infusion (15 mg/hr Intravenous Rate/Dose Change 02/07/17 1811)  levalbuterol (XOPENEX) nebulizer solution 0.63 mg (not administered)  mometasone-formoterol (DULERA) 200-5 MCG/ACT inhaler 2 puff (not administered)  fluticasone furoate-vilanterol (BREO ELLIPTA) 100-25 MCG/INH 1 puff (not administered)  azithromycin (ZITHROMAX) 500 mg in dextrose 5 % 250 mL IVPB (500 mg Intravenous New Bag/Given 02/07/17 1749)  methylPREDNISolone sodium succinate (SOLU-MEDROL) 125 mg/2 mL injection 125 mg (not administered)  fluticasone furoate-vilanterol (BREO ELLIPTA)  100-25 MCG/INH 1 puff (not administered)  rivaroxaban (XARELTO) tablet 20 mg (not administered)  benazepril (LOTENSIN) tablet 40 mg (not administered)  diltiazem (CARDIZEM CD) 24 hr capsule 240 mg (not administered)  atorvastatin (LIPITOR) tablet 40 mg (not administered)  Cholecalciferol CAPS 4,000 Units (not administered)  cloNIDine (CATAPRES) tablet 0.1 mg (not administered)  acetaminophen (TYLENOL) tablet 650 mg (not administered)    Or  acetaminophen (TYLENOL) suppository 650 mg (not administered)  polyethylene glycol (MIRALAX / GLYCOLAX) packet 17 g (not administered)  ondansetron (ZOFRAN) tablet 4 mg (not administered)    Or  ondansetron (ZOFRAN) injection 4 mg (not administered)  cefTRIAXone (ROCEPHIN) 1 g in dextrose 5 % 50 mL IVPB (not administered)  azithromycin (ZITHROMAX) 500 mg in dextrose 5 % 250 mL IVPB (not administered)  methylPREDNISolone sodium succinate (SOLU-MEDROL) 125 mg/2 mL injection 60 mg (not administered)  ipratropium-albuterol (DUONEB) 0.5-2.5 (3) MG/3ML nebulizer solution 3 mL (not administered)  nicotine (NICODERM CQ - dosed in mg/24 hours) patch 14 mg (not administered)  albuterol (PROVENTIL) (2.5 MG/3ML) 0.083% nebulizer solution 5 mg (5 mg Nebulization Given 02/07/17 1533)  ipratropium-albuterol (DUONEB) 0.5-2.5 (3) MG/3ML nebulizer solution 3 mL (3 mLs Nebulization Given 02/07/17 1627)  albuterol (PROVENTIL) (2.5 MG/3ML) 0.083% nebulizer solution 2.5 mg (2.5 mg Nebulization Given 02/07/17 1627)  sodium chloride 0.9 % bolus 1,000 mL (1,000 mLs Intravenous New Bag/Given 02/07/17 1704)  cefTRIAXone (ROCEPHIN) 1 g in dextrose 5 % 50 mL IVPB (1 g Intravenous New Bag/Given 02/07/17 1750)     Initial Impression / Assessment and Plan / ED Course  I have reviewed the triage vital signs and the nursing notes.  Pertinent labs & imaging results that were available during my care of the patient were reviewed by me and considered in my medical decision making (see chart for  details).     Patient is a 76 year old male with history of paroxysmal atrial fibrillation on xarelto, COPD, HTN who presents with 3 days of worsening shortness of breath and sputum production.  Patient arrived in respiratory distress, saturating 76% on room air, in AF w/ RVR with HR in 140-150s. Oxygenation improved with 4 L nasal cannula.   Labs significant for leukocytosis of 22. Negative troponin. Negative lactate. Chest x-ray negative for acute findings. I suspect his acute hypoxic respiratory failure is secondary to COPD exacerbation and A. fib RVR. Patient was seen by his cardiologist  on Wednesday, was trying to be in persistent A. fib atrial fibrillation and discontinued off Rythmol at that time. The patient was treated with antibiotics for COPD exacerbation, and started on dilt drip for AF.   Discussed with hospitalist for admission. Cardiology consulted given AF w/ RVR with recent rythmol cessation.   Patient and plan of care discussed with Attending physician, Dr. Sabra Heck.    Final Clinical Impressions(s) / ED Diagnoses   Final diagnoses:  COPD exacerbation ([place])  Acute respiratory failure with hypoxia ([place])  Atrial fibrillation with rapid ventricular response [facility])    New Prescriptions New Prescriptions   No medications on file     Arnetha Massy, MD 02/07/17 [place]    Noemi Chapel, MD 02/10/17 [phone]

## 2017-02-07 NOTE — Progress Notes (Signed)
Pt has been titrated off the Bipap To a [place]. Pt stable at this time. SATs are stable no SOB or increase WOB noted. No respiratory compromise noted.

## 2017-02-07 NOTE — ED Provider Notes (Signed)
I saw and evaluated the patient, reviewed the resident's note and I agree with the findings and plan.  Pertinent History: The patient is a 76 year old male, history of COPD as well as paroxysmal atrial fibrillation who presents with increasing shortness of breath with coughing as well. He has had fatigue, anorexia, he has had a productive cough. He initially was hypoxic on arrival and required supplemental oxygen. He recently stopped taking Rythmol at the recommendation of his cardiologist, he is unsure why.  Pertinent Exam findings: On exam the patient is in mild to moderate respiratory distress with diffuse expiratory wheezing and very minimal air movement on inspiration. He speaks in shortened sentences. He does use mild accessory muscles. He does have mild increased work of breathing. He has no peripheral edema, no JVD, soft abdomen, clear sensorium.  The patient's EKG shows that he is in atrial fibrillation with a rapid ventricular rate. This is likely brought on both by his hypoxia secondary to COPD as well as possible being taken off that other medication Rythmol. He will likely need admission to the hospital. He is going on to a Cardizem drip, continuous nebulizer therapy, the patient is critically ill secondary to these 2 processes. He is needing constant cardiac monitoring.  The patient was informed of the need for admission and is in agreement.  CRITICAL CARE Performed by: Johnna Acosta Total critical care time: 35 minutes Critical care time was exclusive of separately billable procedures and treating other patients. Critical care was necessary to treat or prevent imminent or life-threatening deterioration. Critical care was time spent personally by me on the following activities: development of treatment plan with patient and/or surrogate as well as nursing, discussions with consultants, evaluation of patient's response to treatment, examination of patient, obtaining history from patient  or surrogate, ordering and performing treatments and interventions, ordering and review of laboratory studies, ordering and review of radiographic studies, pulse oximetry and re-evaluation of patient's condition.  I personally interpreted the EKG as well as the resident and agree with the interpretation on the resident's chart.   EKG Interpretation  Date/Time:  Sunday February 07 2017 15:15:30 EDT Ventricular Rate:  161 PR Interval:    QRS Duration: 74 QT Interval:  240 QTC Calculation: 392 R Axis:   100 Text Interpretation:  Atrial fibrillation with rapid ventricular response with premature ventricular or aberrantly conducted complexes Anterolateral infarct , age undetermined ST & T wave abnormality, consider inferior ischemia or digitalis effect Abnormal ECG Since last tracing afib now present with rapid rate Confirmed by Noemi Chapel [phone]) on 02/07/2017 4:00:34 PM        Final diagnoses:  COPD exacerbation ([place])  Acute respiratory failure with hypoxia ([place])  Atrial fibrillation with rapid ventricular response (HCC)      Noemi Chapel, MD 02/10/17 1850

## 2017-02-07 NOTE — H&P (Signed)
History and Physical    Antonio Wise MOQ:[id number] DOB: 1940-10-29 DOA: 02/07/2017  PCP: [name], PA-C  Patient coming from: Home  Chief Complaint: Shortness of breath, cough and elevated heart rate  HPI: Antonio Wise is a 76 y.o. male with medical history significant of hypertension, COPD not on home oxygen, active smoker, paroxysmal atrial fibrillation on chronic anticoagulation, presented with worsening shortness of breath associated with greenish phlegm for about 3 days. He was seen by his cardiologist 3 days prior to admission when one of his arrhythmia medication rythmol was discontinued. Patient reported chills, nausea, lightheadedness. Denied vomiting, fever, abdominal pain, chest pain. Denied recent travel. No sick contact. He smokes everyday and unable to quit. ED Course: In the ER patient was found to have atrial fibrillation with RVR with heart rate of 160s, x-ray with COPD. He started on diltiazem drip and treated with antibiotics. Admitted for further evaluation. Patient's family members including his son and 2 daughters at bedside. I discussed with ER provider..  Review of Systems: As per HPI otherwise 10 point review of systems negative.    Past Medical History:  Diagnosis Date  . Colon polyp 05/06/2008   repeat 5 years  . COPD (chronic obstructive pulmonary disease) ([place])   . Duodenal ulcer 05/06/2008   EGD  . Elevated PSA   . Gastric ulcer 05/06/2008   EGD  . H/O Helicobacter infection 05/06/2008   EGD  . History of nuclear stress test 02/19/2010   bruce protocol; mild-mod perfusion defect in spetal region consistent with attenuation artifact; no significant ischemia demonstrated; dysrhythmia during exercise  . Hyperlipidemia   . Hypertension   . Paroxysmal atrial fibrillation (HCC)   . Paroxysmal atrial fibrillation (HCC)   . Peripheral vascular disease ([place])   . Smoker unmotivated to quit   . Vitamin D deficiency     Past Surgical History:  Procedure  Laterality Date  . ILIAC VEIN ANGIOPLASTY / STENTING  05/19/2005   left CIA PTA & stenting   . TRANSTHORACIC ECHOCARDIOGRAM  06/10/2005   LV hyperdynamic; borderline LA enlargement; trace MR/TR/AVR    Social history: reports that he has been smoking Cigarettes.  He has been smoking about 0.20 packs per day. He has never used smokeless tobacco. He reports that he does not drink alcohol or use drugs.  Allergies  Allergen Reactions  . Codeine Nausea And Vomiting    Severe nausea    Family History  Problem Relation Age of Onset  . Heart disease Mother   . COPD Father   . Cancer Father   . Cancer Sister 71       Breast Cancer  . Diabetes Brother      Prior to Admission medications   Medication Sig Start Date End Date Taking? Authorizing Provider  ADVAIR DISKUS 500-50 MCG/DOSE AEPB INHALE 1 PUFF BY MOUTH DAILY 07/10/16  Yes Dena Billet B, PA-C  atorvastatin (LIPITOR) 40 MG tablet TAKE 1 TABLET(40 MG) BY MOUTH DAILY AT 6 PM 11/16/16  Yes Lorretta Harp, MD  benazepril (LOTENSIN) 40 MG tablet TAKE 1 TABLET BY MOUTH DAILY 01/12/17  Yes Dena Billet B, PA-C  CARTIA XT 240 MG 24 hr capsule TAKE 1 CAPSULE(240 MG) BY MOUTH DAILY 12/23/16  Yes Lorretta Harp, MD  cloNIDine (CATAPRES) 0.1 MG tablet TAKE 1 TABLET BY MOUTH TWICE DAILY 07/13/16  Yes Dena Billet B, PA-C  fluticasone furoate-vilanterol (BREO ELLIPTA) 100-25 MCG/INH AEPB Inhale 1 puff into the lungs daily as needed (  shortness of breath).   Yes [provider]  Multiple Minerals-Vitamins (CALCIUM & VIT D3 BONE HEALTH PO) Take 1 tablet by mouth daily.   Yes [provider]  XARELTO 20 MG TABS tablet TAKE 1 TABLET(20 MG) BY MOUTH DAILY WITH SUPPER 02/01/17  Yes Lorretta Harp, MD  Cholecalciferol 4000 units CAPS Take 1 capsule (4,000 Units total) by mouth daily. 10/14/16   [name], PA-C    Physical Exam: Vitals:   02/07/17 1631 02/07/17 1705 02/07/17 1730 02/07/17 1800  BP:  (!) 163/99 (!) 170/98 (!) 162/89    Pulse:  (!) 133 (!) 143 (!) 142  Resp:  (!) 22 (!) 24 (!) 23  Temp:      TempSrc:      SpO2: 94% 94% 93% 90%      Constitutional: NAD, calm, comfortable Vitals:   02/07/17 1631 02/07/17 1705 02/07/17 1730 02/07/17 1800  BP:  (!) 163/99 (!) 170/98 (!) 162/89  Pulse:  (!) 133 (!) 143 (!) 142  Resp:  (!) 22 (!) 24 (!) 23  Temp:      TempSrc:      SpO2: 94% 94% 93% 90%   Eyes: PERRL, lids and conjunctivae normal ENMT: Mucous membranes are moist. Posterior pharynx clear of any exudate or lesions.Normal dentition.  Neck: normal, supple,  Respiratory: Bilateral diffuse wheezing, no crackles Cardiovascular: . Irregularly irregular tachycardic, S1-S2 normal Abdomen: Bowel sound positive, soft, nontender. Musculoskeletal: no clubbing / cyanosis. No joint deformity upper and lower extremities.  Skin: no rashes, lesions, ulcers. No induration Neurologic: CN 2-12 grossly intact. S Strength 5/5 in all 4.  Psychiatric: Normal judgment and insight. Alert and oriented x 3. Normal mood.    Labs on Admission: I have personally reviewed following labs and imaging studies  CBC:  Recent Labs Lab 02/07/17 1520  WBC 22.2*  HGB 17.2*  HCT 54.8*  MCV 100.0  PLT 476   Basic Metabolic Panel:  Recent Labs Lab 02/07/17 1520  NA 142  K 4.0  CL 102  CO2 25  GLUCOSE 192*  BUN 18  CREATININE 1.26*  CALCIUM 9.3   GFR: Estimated Creatinine Clearance: 58.1 mL/min (A) (by C-G formula based on SCr of 1.26 mg/dL (H)). Liver Function Tests: No results for input(s): AST, ALT, ALKPHOS, BILITOT, PROT, ALBUMIN in the last 168 hours. No results for input(s): LIPASE, AMYLASE in the last 168 hours. No results for input(s): AMMONIA in the last 168 hours. Coagulation Profile: No results for input(s): INR, PROTIME in the last 168 hours. Cardiac Enzymes: No results for input(s): CKTOTAL, CKMB, CKMBINDEX, TROPONINI in the last 168 hours. BNP (last 3 results) No results for input(s): PROBNP in the  last 8760 hours. HbA1C: No results for input(s): HGBA1C in the last 72 hours. CBG: No results for input(s): GLUCAP in the last 168 hours. Lipid Profile: No results for input(s): CHOL, HDL, LDLCALC, TRIG, CHOLHDL, LDLDIRECT in the last 72 hours. Thyroid Function Tests: No results for input(s): TSH, T4TOTAL, FREET4, T3FREE, THYROIDAB in the last 72 hours. Anemia Panel: No results for input(s): VITAMINB12, FOLATE, FERRITIN, TIBC, IRON, RETICCTPCT in the last 72 hours. Urine analysis:    Component Value Date/Time   COLORURINE YELLOW 10/11/2015 1212   APPEARANCEUR CLEAR 10/11/2015 1212   LABSPEC 1.020 10/11/2015 1212   PHURINE 5.5 10/11/2015 1212   GLUCOSEU NEGATIVE 10/11/2015 1212   HGBUR NEGATIVE 10/11/2015 1212   BILIRUBINUR NEGATIVE 10/11/2015 1212   KETONESUR NEGATIVE 10/11/2015 1212   PROTEINUR TRACE (A)  10/11/2015 1212   NITRITE NEGATIVE 10/11/2015 1212   LEUKOCYTESUR NEGATIVE 10/11/2015 1212   Sepsis Labs: !!!!!!!!!!!!!!!!!!!!!!!!!!!!!!!!!!!!!!!!!!!! @LABRCNTIP (procalcitonin:4,lacticidven:4) )No results found for this or any previous visit (from the past 240 hour(s)).   Radiological Exams on Admission: Dg Chest Portable 1 View  Result Date: 02/07/2017 CLINICAL DATA:  Shortness of breath for 3 days. EXAM: PORTABLE CHEST 1 VIEW COMPARISON:  CT chest 03/11/2007.  PA and lateral chest 02/29/2008. FINDINGS: The chest is hyperexpanded with attenuation of the pulmonary vasculature. Lungs are clear. Heart size is normal. Aortic atherosclerosis noted. No pneumothorax or pleural effusion. IMPRESSION: No acute disease. Findings compatible with COPD. Atherosclerosis. Electronically Signed   By: Inge Rise M.D.   On: 02/07/2017 17:02    EKG: A. fib with RVR  Assessment/Plan Active Problems:   Hypertension   COPD with acute exacerbation (HCC)   Atrial fibrillation with RVR (HCC)   Acute respiratory failure with hypoxia and hypercapnia (HCC)  # Atrial fibrillation with rapid  ventricular response: Likely in the setting of COPD exacerbation. One of his antiarrhythmic medication was discontinued by his cardiologist about 3 days ago. I'll continue diltiazem drip, diltiazem and oral. -continue xarelto for systemic anticoagulation -As per ER, cardiology was consulted -Continue telemetry, admitted in stepdown unit for close monitoring of heart rate blood pressure and titration of Cardizem drip. -Check echocardiogram -Check BMP.  #Acute respiratory failure with hypoxia and hypercapnia in the setting of COPD exacerbation, acute: -Chest x-ray consistent with COPD. -Continue empiric antibiotics with ceftriaxone and azithromycin. Follow up culture results. -I will order a dose of Solu-Medrol 125 mg in the ER and continue 60 mg every 6 hourly. Patient has diffuse wheezing. Continue bronchodilators. -Patient currently requiring 4 L of oxygen. -Check arterial blood gas, discussed with the ER.  #Essential hypertension: Resume home medication. Monitor blood pressure closely.  #Actively smoker tobacco dependence: Education provided to the patient. Nicotine patch ordered.  # Leukocytosis: Currently in the setting of distress, COPD exacerbation. On antibiotics and treatment as above. Follow up culture results. Monitor WBC.  Consult PT OT and case manager. I discussed the plan with the patient, family members at bedside.  DVT prophylaxis: On systemic anticoagulation Code Status: Full code Family Communication: Discussed with the patient's son and daughter at bedside Disposition Plan: Likely discharge home in 2-3 days Consults called: ER consulted cardiologist Admission status: Inpatient given severity of illness.   Rodney Wigger Tanna Furry MD [facility] Pager [phone]  If 7PM-7AM, please contact night-coverage [email or web address] Password [facility]  02/07/2017, 6:26 PM

## 2017-02-07 NOTE — ED Notes (Signed)
Report attempted 

## 2017-02-07 NOTE — Consult Note (Signed)
CARDIOLOGY CONSULT NOTE   Referring Physician: Dr. Carolin Sicks, [facility] Primary Physician: Dena Billet Primary Cardiologist: Dr. Quay Burow Reason for Consultation: atrial fibrillation with RVR  HPI: Mr. Hazen is a 76 yo man with PMH hypertension, COPD, tobacco use, paroxysmal (now likely persistent) atrial fibrillation on chronic anticoagulation who presented to the ER today with shortness of breath. He is seen at the request of Dr. Carolin Sicks for consult regarding atrial fibrillation with rapid ventricular response. The patient notes several days of cough, wheezing, increased sputum green in color, and shortness of breath. He denies PND, orthopnea, edema, chest pain, or syncope. He notes that his heart has been racing today.   He notes that he recently saw Dr. Gwenlyn Found, and his rhythmol was discontinued 8/1 given that he was in atrial fibrillation at the time of the visit. Per Dr. Kennon Holter note, rhythm control discontinued in favor of rate control. Patient has continue to take his diltiazem, though he does not think he has taken it yet today. Has been on rivaroxaban chronically, no missed doses in the last 4 weeks. Last echo with normal EF.  In the ER, he was noted to be in respiratory distress and was saturating 76% on room air. His oxygenation improved with nasal cannula (does not use home O2). His initial heart rate and rhythm was atrial fibrillation with rapid ventricular response, with heart rates up to 150s. CXR showed no acute processes. Labs notable for WBC 22, negative troponin, BNP 295, lactate 1.93. Blood cultures pending at this time. ABG 7.239/73/74/31  Review of Systems:     Cardiac Review of Systems: {Y] = yes [ ]  = no  Chest Pain [ N   ]  Resting SOB [ Y  ] Exertional SOB  [  Y]  Orthopnea Aqua.Slicker  ]   Pedal Edema [ N  ]    Palpitations [ Y ] Syncope  [ N ]   Presyncope [  N ]  General Review of Systems: [Y] = yes [  ]=no Constitional: recent weight change [  ]; anorexia [   ]; fatigue [  ]; nausea [  ]; night sweats [  ]; fever [  ]; or chills [  ];                                                                     Eyes : blurred vision [  ]; diplopia [   ]; vision changes [  ];  Amaurosis fugax[  ]; Resp: cough Jazmín.Cullens  ];  wheezing[ Y ];  hemoptysis[  ];  PND [  ];  GI:  gallstones[  ], vomiting[  ];  dysphagia[  ]; melena[  ];  hematochezia [  ]; heartburn[  ];   GU: kidney stones [  ]; hematuria[  ];   dysuria [  ];  nocturia[  ]; incontinence [  ];             Skin: rash, swelling[  ];, hair loss[  ];  peripheral edema[  ];  or itching[  ]; Musculosketetal: myalgias[  ];  joint swelling[  ];  joint erythema[  ];  joint pain[  ];  back pain[  ];  Heme/Lymph: bruising[  ];  bleeding[  ];  anemia[  ];  Neuro: TIA[  ];  headaches[  ];  stroke[  ];  vertigo[  ];  seizures[  ];   paresthesias[  ];  difficulty walking[  ];  Psych:depression[  ]; anxiety[  ];  Endocrine: diabetes[  ];  thyroid dysfunction[  ];  Other:  Past Medical History:  Diagnosis Date  . Colon polyp 05/06/2008   repeat 5 years  . COPD (chronic obstructive pulmonary disease) ([place])   . Duodenal ulcer 05/06/2008   EGD  . Elevated PSA   . Gastric ulcer 05/06/2008   EGD  . H/O Helicobacter infection 05/06/2008   EGD  . History of nuclear stress test 02/19/2010   bruce protocol; mild-mod perfusion defect in spetal region consistent with attenuation artifact; no significant ischemia demonstrated; dysrhythmia during exercise  . Hyperlipidemia   . Hypertension   . Paroxysmal atrial fibrillation (HCC)   . Paroxysmal atrial fibrillation (HCC)   . Peripheral vascular disease ([place])   . Smoker unmotivated to quit   . Vitamin D deficiency      (Not in a hospital admission)   . [START ON 02/08/2017] atorvastatin  40 mg Oral q1800  . benazepril  40 mg Oral Daily  . Cholecalciferol  1 capsule Oral Daily  . cloNIDine  0.1 mg Oral BID  . diltiazem  240 mg Oral Daily  . [START ON 02/08/2017] fluticasone  furoate-vilanterol  1 puff Inhalation Daily  . levalbuterol  0.63 mg Nebulization Q4H  . methylPREDNISolone (SOLU-MEDROL) injection  60 mg Intravenous Q6H  . mometasone-formoterol  2 puff Inhalation BID  . nicotine  14 mg Transdermal Daily  . rivaroxaban  20 mg Oral Daily    Infusions: . [START ON 02/08/2017] azithromycin    . [START ON 02/08/2017] cefTRIAXone (ROCEPHIN)  IV    . diltiazem (CARDIZEM) infusion 15 mg/hr (02/07/17 1811)  . famotidine (PEPCID) IV      Allergies  Allergen Reactions  . Codeine Nausea And Vomiting    Severe nausea    Social History   Social History  . Marital status: Married    Spouse name: N/A  . Number of children: 3  . Years of education: N/A   Occupational History  .  [employer]   Social History Main Topics  . Smoking status: Current Some Day Smoker    Packs/day: 0.20    Types: Cigarettes    Last attempt to quit: 05/17/2013  . Smokeless tobacco: Never Used  . Alcohol use No  . Drug use: No  . Sexual activity: Not on file   Other Topics Concern  . Not on file   Social History Narrative   Entered 06/2014:   His wife was also a patient of mine ([facility])   She passed away in 2015--secondary to ongoing smoker, lung cancer.    At [place] 06/2104 pt reports that one of their kids was already living with them--so he is not alone, even now .    Family History  Problem Relation Age of Onset  . Heart disease Mother   . COPD Father   . Cancer Father   . Cancer Sister 4       Breast Cancer  . Diabetes Brother     PHYSICAL EXAM: Vitals:   02/07/17 1900 02/07/17 1924  BP: (!) 141/79   Pulse: (!) 137 (!) 131  Resp: (!) 26 (!) 27  Temp:       Intake/Output Summary (Last 24 hours) at 02/07/17  [place] filed at 02/07/17 1921  Gross per 24 hour  Intake             1250 ml  Output                0 ml  Net             1250 ml   General:  Appears in mild distress, able to speak in sentences HEENT: normal Neck: supple. no JVD.  Carotids 2+ bilat. No lymphadenopathy or thryomegaly appreciated. Cor: PMI nondisplaced. Tachycardic, irregular rate. No rubs, gallops or murmurs. Lungs: coarse with diffuse wheezing Abdomen: soft, nontender, nondistended. No hepatosplenomegaly. No bruits or masses. Good bowel sounds. Extremities: no cyanosis, clubbing, rash, edema Neuro: alert & oriented x 3, cranial nerves grossly intact. moves all 4 extremities w/o difficulty.   ECG: atrial fibrillation with RVR  Results for orders placed or performed during the hospital encounter of 02/07/17 (from the past 24 hour(s))  Basic metabolic panel     Status: Abnormal   Collection Time: 02/07/17  3:20 PM  Result Value Ref Range   Sodium 142 135 - 145 mmol/L   Potassium 4.0 3.5 - 5.1 mmol/L   Chloride 102 101 - 111 mmol/L   CO2 25 22 - 32 mmol/L   Glucose, Bld 192 (H) 65 - 99 mg/dL   BUN 18 6 - 20 mg/dL   Creatinine, Ser 1.26 (H) 0.61 - 1.24 mg/dL   Calcium 9.3 8.9 - 10.3 mg/dL   GFR calc non Af Amer 54 (L) >60 mL/min   GFR calc Af Amer >60 >60 mL/min   Anion gap 15 5 - 15  CBC     Status: Abnormal   Collection Time: 02/07/17  3:20 PM  Result Value Ref Range   WBC 22.2 (H) 4.0 - 10.5 K/uL   RBC 5.48 4.22 - 5.81 MIL/uL   Hemoglobin 17.2 (H) 13.0 - 17.0 g/dL   HCT 54.8 (H) 39.0 - 52.0 %   MCV 100.0 78.0 - 100.0 fL   MCH 31.4 26.0 - 34.0 pg   MCHC 31.4 30.0 - 36.0 g/dL   RDW 13.7 11.5 - 15.5 %   Platelets 377 150 - 400 K/uL  Brain natriuretic peptide     Status: Abnormal   Collection Time: 02/07/17  4:45 PM  Result Value Ref Range   B Natriuretic Peptide 295.9 (H) 0.0 - 100.0 pg/mL  I-Stat Troponin, ED (not at [facility])     Status: None   Collection Time: 02/07/17  4:56 PM  Result Value Ref Range   Troponin i, poc 0.00 0.00 - 0.08 ng/mL   Comment 3          I-Stat CG4 Lactic Acid, ED     Status: Abnormal   Collection Time: 02/07/17  4:58 PM  Result Value Ref Range   Lactic Acid, Venous 1.93 (H) 0.5 - 1.9 mmol/L  I-Stat arterial  blood gas, ED     Status: Abnormal   Collection Time: 02/07/17  7:40 PM  Result Value Ref Range   pH, Arterial 7.239 (L) 7.350 - 7.450   pCO2 arterial 72.9 (HH) 32.0 - 48.0 mmHg   pO2, Arterial 74.0 (L) 83.0 - 108.0 mmHg   Bicarbonate 31.2 (H) 20.0 - 28.0 mmol/L   TCO2 33 0 - 100 mmol/L   O2 Saturation 91.0 %   Acid-Base Excess 1.0 0.0 - 2.0 mmol/L   Patient temperature 98.6 F    Collection site RADIAL, ALLEN'S  TEST ACCEPTABLE    Drawn by RT    Sample type ARTERIAL    Comment NOTIFIED PHYSICIAN    Dg Chest Portable 1 View  Result Date: 02/07/2017 CLINICAL DATA:  Shortness of breath for 3 days. EXAM: PORTABLE CHEST 1 VIEW COMPARISON:  CT chest 03/11/2007.  PA and lateral chest 02/29/2008. FINDINGS: The chest is hyperexpanded with attenuation of the pulmonary vasculature. Lungs are clear. Heart size is normal. Aortic atherosclerosis noted. No pneumothorax or pleural effusion. IMPRESSION: No acute disease. Findings compatible with COPD. Atherosclerosis. Electronically Signed   By: Inge Rise M.D.   On: 02/07/2017 17:02   ASSESSMENT/PLAN: Mr. Texidor is a 76 yo man with PMH hypertension, COPD, tobacco use, paroxysmal (now likely persistent) atrial fibrillation on chronic anticoagulation who presented to the ER today with shortness of breath. He is seen at the request of Dr. Carolin Sicks for consult regarding atrial fibrillation with rapid ventricular response. COPD exacerbation likely driving atrial fibrillation with RVR; may also be impacted by recent cessation of rhythmol.  Patient is hemodynamically stable. On my interview, his HR was gradually declining to the 130s on cardizem drip. Given this, no need to urgently cardiovert in the ER. Unclear how long he has been in atrial fibrillation, as he did not notice it prior to his visit with Dr. Gwenlyn Found less than a week ago.  Atrial fibrillation with RVR: -continue cardizem drip, hold PO cardizem while on drip. Can restart once rate is better  controlled, would keep total dose at 360 mg/day or less -continue rivaroxaban -monitor on telemetry -rate likely driven by his COPD exacerbation as well -per Dr. Kennon Holter note, plan was for rate control. Based on his response to diltiazem drip, could reassess rate vs. Rhythm control strategy -would keep NPO at midnight for possible cardioversion in the AM if his rates are not improved -amiodarone, beta blocker not ideal given his lung disease/COPD but could be considered if he remains refractory.  COPD exacerbation: being managed by primary team. Agree with counseling re: tobacco cessation.  Labs consistent with hemoconcentration, already received 1 L bolus. Monitor labs.  Hypertension: has plenty of blood pressure room today. On benezapril, clonidine at home. Would continue clonidine, would assess in AM for benezapril given that he is on cardizem drip at higher dose than his home dose--avoid hypotension.   Buford Dresser, MD, PhD, overnight cardiology provider

## 2017-02-07 NOTE — ED Triage Notes (Signed)
Pt presents with c/o shortness of breath. The shortness of breath began about three days ago. He reports fatigue, malaise, anorexia, productive cough with green sputum, low oxygen levels. He denies fevers, pain. He reports using his COPD inhalers daily as prescribed.

## 2017-02-08 ENCOUNTER — Inpatient Hospital Stay (HOSPITAL_COMMUNITY): Payer: PPO

## 2017-02-08 DIAGNOSIS — I480 Paroxysmal atrial fibrillation: Secondary | ICD-10-CM

## 2017-02-08 LAB — CBC
HCT: 47 % (ref 39.0–52.0)
Hemoglobin: 14.3 g/dL (ref 13.0–17.0)
MCH: 30.6 pg (ref 26.0–34.0)
MCHC: 30.4 g/dL (ref 30.0–36.0)
MCV: 100.4 fL — ABNORMAL HIGH (ref 78.0–100.0)
Platelets: 292 10*3/uL (ref 150–400)
RBC: 4.68 MIL/uL (ref 4.22–5.81)
RDW: 14.1 % (ref 11.5–15.5)
WBC: 13.8 10*3/uL — ABNORMAL HIGH (ref 4.0–10.5)

## 2017-02-08 LAB — BASIC METABOLIC PANEL
Anion gap: 11 (ref 5–15)
BUN: 20 mg/dL (ref 6–20)
CALCIUM: 8.9 mg/dL (ref 8.9–10.3)
CO2: 28 mmol/L (ref 22–32)
Chloride: 102 mmol/L (ref 101–111)
Creatinine, Ser: 1.12 mg/dL (ref 0.61–1.24)
GFR calc Af Amer: >60 mL/min (ref >60)
GLUCOSE: 191 mg/dL — AB (ref 65–99)
Potassium: 4.8 mmol/L (ref 3.5–5.1)
Sodium: 141 mmol/L (ref 135–145)

## 2017-02-08 LAB — GLUCOSE, CAPILLARY: Glucose-Capillary: 168 mg/dL — ABNORMAL HIGH (ref 65–99)

## 2017-02-08 LAB — MRSA PCR SCREENING: MRSA by PCR: NEGATIVE

## 2017-02-08 MED ORDER — METHYLPREDNISOLONE SODIUM SUCC 125 MG IJ SOLR
60.0000 mg | Freq: Two times a day (BID) | INTRAMUSCULAR | Status: DC
  Administered 2017-02-08 – 2017-02-10 (×5): 60 mg via INTRAVENOUS
  Administered 2017-02-11: 62.5 mg via INTRAVENOUS
  Filled 2017-02-08 (×6): qty 2

## 2017-02-08 MED ORDER — PERFLUTREN LIPID MICROSPHERE
INTRAVENOUS | Status: AC
  Administered 2017-02-08: 5 mL
  Filled 2017-02-08: qty 10

## 2017-02-08 MED ORDER — CLONIDINE HCL 0.1 MG PO TABS: 0.1000 mg | ORAL_TABLET | Freq: Every day | ORAL | Status: DC

## 2017-02-08 MED ORDER — DILTIAZEM HCL 60 MG PO TABS
120.0000 mg | ORAL_TABLET | Freq: Four times a day (QID) | ORAL | Status: DC
  Administered 2017-02-08 – 2017-02-09 (×4): 120 mg via ORAL
  Filled 2017-02-08 (×4): qty 2

## 2017-02-08 MED ORDER — LEVALBUTEROL HCL 0.63 MG/3ML IN NEBU: 0.6300 mg | INHALATION_SOLUTION | Freq: Four times a day (QID) | RESPIRATORY_TRACT | Status: DC | PRN

## 2017-02-08 MED ORDER — BENAZEPRIL HCL 10 MG PO TABS
20.0000 mg | ORAL_TABLET | Freq: Every day | ORAL | Status: DC
  Administered 2017-02-08 – 2017-02-11 (×4): 20 mg via ORAL
  Filled 2017-02-08 (×3): qty 2

## 2017-02-08 MED ORDER — FAMOTIDINE 20 MG PO TABS
10.0000 mg | ORAL_TABLET | Freq: Two times a day (BID) | ORAL | Status: DC
  Administered 2017-02-08 – 2017-02-11 (×6): 10 mg via ORAL
  Filled 2017-02-08 (×6): qty 1

## 2017-02-08 MED ORDER — PERFLUTREN LIPID MICROSPHERE
1.0000 mL | INTRAVENOUS | Status: AC | PRN
  Filled 2017-02-08: qty 10

## 2017-02-08 NOTE — Progress Notes (Signed)
PROGRESS NOTE  Antonio Wise SMO:[id number] DOB: [birth year] DOA: 02/07/2017 PCP: [name], PA-C   LOS: 1 day   Brief Narrative / Interim history: 76 yo male with medical history significant for HTN, COPD not on home oxygen, current smoker, PAF on chronic anticoagulation presented with worsening SOB associated and greenish phlegm X3 days since discontinuing his antiarrhythmic in favor of rate control.  In the ED, patient was found to be in Afib with RVR in the 160's and O2 Sat 76% ORA. CXR showed no acute process and existing COPD. Patient was started on diltiazem drip and antibiotics and admitted for further evaluation with son and 2 daughters at bedside. Cardiology consulted per ED.  Assessment & Plan: Principal Problem:   Acute respiratory failure with hypoxia and hypercapnia (HCC) Active Problems:   Hypertension   COPD exacerbation (HCC)   Atrial fibrillation with rapid ventricular response (HCC)  Acute Respiratory Failure with Hypoxia and Hypercapnia -2/2  COPD acute exacerbation. Does not use home oxygen. -ED CXR with no acute process; consistent with COPD. -Patient currently tachypnic, tachycardic.   -Continue nasal cannula. Titrated off Bipap to [place]. -Continue empiric abx: Ceftriaxone and azithromycin. -Continue bronchodilators, Solu-medrol. -Blood cultures X2 pending; no growth <24 hours. MRSA negative. -Recheck ABG. Bicarb compensation: 31.2 on 8/5.  COPD exacerbation -Gold Protocol. -On nasal cannula as above. -Monitor SpO2, breathing.   Hypertension, improving -SBP 130s-140s. -Continue home Benazepril and Clonidine. -Monitor.  Atrial fibrillation with rapid ventricular response -Likely in setting of acute COPD exacerbation and discontinuation of antiarrhythmic.CHADS2VASc=2 (HTN, age X47).  -(8/6) Echo results pending. (5/23) Echo with EF 55-60%. Thickening, calcification of Aortic & Mitral valve; atrial septum thickened.  -Cardiology consult per ED. -Continue  Cardizem drip for rate control; rivaroxaban for anticoagulation as per cardiology. -NPO for cardioversion.  -Daily BMP.  Tobacco abuse -Counseled regarding cessation. -Continue Nicotine patch.  Leukocytosis -In setting of respiratory distress, COPD acute exacerbation. -Empiric abx treatment as above. -WBC (8/6) at13.8, (8/5) at 22.2. -Lactic acid trended down. Recheck today. -Blood culture X2 pending with no growth <24h. -Daily CBC.  Hyperglycemia -Likely in setting of above. -(8/6) glucose 191; (8/5) glucose 192. -Monitor in labs.  Hemoconcentration in setting of dehydration, resolved -ED labs consistent with hemoconcentration, improved after IVF.  DVT prophylaxis: Rivaroxaban Code Status: Full Family Communication: Family at bedside Disposition Plan: Inpatient. Likely discharge in 2-3 days  Consultants:   Cardiology, per ED.   PT/OT   Case manager  Procedures:   2D echo: 8/5 Echo pending results\  Cardioversion: Pending    Antimicrobials: Ceftriaxone 1g injection and azithromycin 500mg  in dextrose (IV).  Subjective: Patient breathing better today. Does not report symptoms associated with Afib and RVR.  Objective: Vitals:   02/08/17 0100 02/08/17 0300 02/08/17 0602 02/08/17 0830  BP:   132/82   Pulse: (!) 137 (!) 120 (!) 111   Resp: 17 17 (!) 21   Temp:   97.9 F (36.6 C)   TempSrc:   Oral   SpO2: 97% 97% 98% 95%  Weight:   86 kg (189 lb 9.6 oz)     Intake/Output Summary (Last 24 hours) at 02/08/17 1027 Last data filed at 02/08/17 0700  Gross per 24 hour  Intake             1692 ml  Output              250 ml  Net  1442 ml   Filed Weights   02/08/17 0602  Weight: 86 kg (189 lb 9.6 oz)    Examination:  Vitals:   02/08/17 0100 02/08/17 0300 02/08/17 0602 02/08/17 0830  BP:   132/82   Pulse: (!) 137 (!) 120 (!) 111   Resp: 17 17 (!) 21   Temp:   97.9 F (36.6 C)   TempSrc:   Oral   SpO2: 97% 97% 98% 95%  Weight:   86 kg  (189 lb 9.6 oz)     Constitutional: NAD Eyes: PERRL, lids and conjunctivae normal Neck: normal, supple, no masses Respiratory: On [place]. Bilateral and diffuse expiratory wheeze. Few rales. Decreased breath sounds. Slightly labored and still on oxygen. Cardiovascular: Irregularly irregular, tachycardic. no murmurs / rubs / gallops.   Abdomen: no tenderness. Bowel sounds positive.  Musculoskeletal: no clubbing / cyanosis. No joint deformity upper and lower extremities.  Skin: no rashes, lesions, ulcers. No induration Neurologic: Strength 5/5 in all 4.  Psychiatric: Normal judgment and insight. Alert and oriented x 3. Normal mood.    Data Reviewed: I have independently reviewed following labs and imaging studies   CBC:  Recent Labs Lab 02/07/17 1520 02/08/17 0654  WBC 22.2* 13.8*  HGB 17.2* 14.3  HCT 54.8* 47.0  MCV 100.0 100.4*  PLT 377 825   Basic Metabolic Panel:  Recent Labs Lab 02/07/17 1520 02/08/17 0654  NA 142 141  K 4.0 4.8  CL 102 102  CO2 25 28  GLUCOSE 192* 191*  BUN 18 20  CREATININE 1.26* 1.12  CALCIUM 9.3 8.9   GFR: Estimated Creatinine Clearance: 65.4 mL/min (by C-G formula based on SCr of 1.12 mg/dL). Liver Function Tests: No results for input(s): AST, ALT, ALKPHOS, BILITOT, PROT, ALBUMIN in the last 168 hours. No results for input(s): LIPASE, AMYLASE in the last 168 hours. No results for input(s): AMMONIA in the last 168 hours. Coagulation Profile: No results for input(s): INR, PROTIME in the last 168 hours. Cardiac Enzymes: No results for input(s): CKTOTAL, CKMB, CKMBINDEX, TROPONINI in the last 168 hours. BNP (last 3 results) No results for input(s): PROBNP in the last 8760 hours. HbA1C: No results for input(s): HGBA1C in the last 72 hours. CBG: No results for input(s): GLUCAP in the last 168 hours. Lipid Profile: No results for input(s): CHOL, HDL, LDLCALC, TRIG, CHOLHDL, LDLDIRECT in the last 72 hours. Thyroid Function Tests: No results  for input(s): TSH, T4TOTAL, FREET4, T3FREE, THYROIDAB in the last 72 hours. Anemia Panel: No results for input(s): VITAMINB12, FOLATE, FERRITIN, TIBC, IRON, RETICCTPCT in the last 72 hours. Urine analysis:    Component Value Date/Time   COLORURINE YELLOW 10/11/2015 1212   APPEARANCEUR CLEAR 10/11/2015 1212   LABSPEC 1.020 10/11/2015 1212   PHURINE 5.5 10/11/2015 1212   GLUCOSEU NEGATIVE 10/11/2015 1212   HGBUR NEGATIVE 10/11/2015 1212   BILIRUBINUR NEGATIVE 10/11/2015 1212   KETONESUR NEGATIVE 10/11/2015 1212   PROTEINUR TRACE (A) 10/11/2015 1212   NITRITE NEGATIVE 10/11/2015 1212   LEUKOCYTESUR NEGATIVE 10/11/2015 1212   Sepsis Labs: Invalid input(s): PROCALCITONIN, LACTICIDVEN  Recent Results (from the past 240 hour(s))  MRSA PCR Screening     Status: None   Collection Time: 02/08/17  4:18 AM  Result Value Ref Range Status   MRSA by PCR NEGATIVE NEGATIVE Final    Comment:        The GeneXpert MRSA Assay (FDA approved for NASAL specimens only), is one component of a comprehensive MRSA colonization surveillance program. It is  not intended to diagnose MRSA infection nor to guide or monitor treatment for MRSA infections.       Radiology Studies: Dg Chest Portable 1 View  Result Date: 02/07/2017 CLINICAL DATA:  Shortness of breath for 3 days. EXAM: PORTABLE CHEST 1 VIEW COMPARISON:  CT chest 03/11/2007.  PA and lateral chest 02/29/2008. FINDINGS: The chest is hyperexpanded with attenuation of the pulmonary vasculature. Lungs are clear. Heart size is normal. Aortic atherosclerosis noted. No pneumothorax or pleural effusion. IMPRESSION: No acute disease. Findings compatible with COPD. Atherosclerosis. Electronically Signed   By: Inge Rise M.D.   On: 02/07/2017 17:02     Scheduled Meds: . atorvastatin  40 mg Oral q1800  . benazepril  20 mg Oral Daily  . chlorhexidine  15 mL Mouth Rinse BID  . cholecalciferol  4,000 Units Oral Daily  . [START ON 02/09/2017] cloNIDine   0.1 mg Oral Daily  . diltiazem  120 mg Oral Q6H  . fluticasone furoate-vilanterol  1 puff Inhalation Daily  . levalbuterol  0.63 mg Nebulization Q6H  . mouth rinse  15 mL Mouth Rinse q12n4p  . methylPREDNISolone (SOLU-MEDROL) injection  60 mg Intravenous Q6H  . nicotine  14 mg Transdermal Daily  . rivaroxaban  20 mg Oral Q supper   Continuous Infusions: . azithromycin    . cefTRIAXone (ROCEPHIN)  IV    . famotidine (PEPCID) IV Stopped (02/07/17 2249)     Marrianne Mood, Student-PA  02/08/17 10:28 AM

## 2017-02-08 NOTE — Progress Notes (Signed)
Progress Note  Patient Name: Antonio Wise Date of Encounter: 02/08/2017  Primary Cardiologist: Dr Gwenlyn Found  Patient Profile     76 y.o. male w/ hx HTN, COPD, PAF>>persistent afib, tob use, CHADS2VQASC=2 (HTN, age x 1)  on Xarelto, was admitted 08/05 with SOB and afib RVR.  Subjective   Not aware of atrial fib, doing ok w/ Cardizem. Breathing is better, not at baseline. Admits baseline is poor. Not sure he can quit tobacco.  Inpatient Medications    Scheduled Meds: . atorvastatin  40 mg Oral q1800  . chlorhexidine  15 mL Mouth Rinse BID  . cholecalciferol  4,000 Units Oral Daily  . cloNIDine  0.1 mg Oral BID  . fluticasone furoate-vilanterol  1 puff Inhalation Daily  . levalbuterol  0.63 mg Nebulization Q6H  . mouth rinse  15 mL Mouth Rinse q12n4p  . methylPREDNISolone (SOLU-MEDROL) injection  60 mg Intravenous Q6H  . nicotine  14 mg Transdermal Daily  . rivaroxaban  20 mg Oral Q supper   Continuous Infusions: . azithromycin    . cefTRIAXone (ROCEPHIN)  IV    . diltiazem (CARDIZEM) infusion 15 mg/hr (02/08/17 0840)  . famotidine (PEPCID) IV Stopped (02/07/17 2249)   PRN Meds: acetaminophen **OR** acetaminophen, ondansetron **OR** ondansetron (ZOFRAN) IV, polyethylene glycol   Vital Signs    Vitals:   02/08/17 0100 02/08/17 0300 02/08/17 0602 02/08/17 0830  BP:   132/82   Pulse: (!) 137 (!) 120 (!) 111   Resp: 17 17 (!) 21   Temp:   97.9 F (36.6 C)   TempSrc:   Oral   SpO2: 97% 97% 98% 95%  Weight:   189 lb 9.6 oz (86 kg)     Intake/Output Summary (Last 24 hours) at 02/08/17 0956 Last data filed at 02/08/17 0700  Gross per 24 hour  Intake             1692 ml  Output              250 ml  Net             1442 ml   Filed Weights   02/08/17 0602  Weight: 189 lb 9.6 oz (86 kg)    Telemetry    Coarse atrial fib, RVR much of the time but generally < 110. - Personally Reviewed  ECG    n/a - Personally Reviewed  Physical Exam   General: Well developed,  well nourished, male appearing in no acute distress. Head: Normocephalic, atraumatic.  Neck: Supple without bruits, JVD 8-9 cm. Lungs:  Resp regular and mod labored, exp wheeze noted, decreased BS bases w/ few rales Heart: Irreg R&R, S1, S2, no S3, S4, or murmur; no rub. Abdomen: Soft, non-tender, non-distended with normoactive bowel sounds. No hepatomegaly. No rebound/guarding. No obvious abdominal masses. Extremities: No clubbing, cyanosis, no edema. Distal pedal pulses are 2+ bilaterally. Neuro: Alert and oriented X 3. Moves all extremities spontaneously. Psych: Normal affect.  Labs    Hematology  Recent Labs Lab 02/07/17 1520 02/08/17 0654  WBC 22.2* 13.8*  RBC 5.48 4.68  HGB 17.2* 14.3  HCT 54.8* 47.0  MCV 100.0 100.4*  MCH 31.4 30.6  MCHC 31.4 30.4  RDW 13.7 14.1  PLT 377 292    Chemistry  Recent Labs Lab 02/07/17 1520 02/08/17 0654  NA 142 141  K 4.0 4.8  CL 102 102  CO2 25 28  GLUCOSE 192* 191*  BUN 18 20  CREATININE 1.26* 1.12  CALCIUM 9.3 8.9  GFRNONAA 54* >60  GFRAA >60 >60  ANIONGAP 15 11     Cardiac Enzymes   Recent Labs Lab 02/07/17 1656  TROPIPOC 0.00     BNP  Recent Labs Lab 02/07/17 1645  BNP 295.9*     Radiology    Dg Chest Portable 1 View  Result Date: 02/07/2017 CLINICAL DATA:  Shortness of breath for 3 days. EXAM: PORTABLE CHEST 1 VIEW COMPARISON:  CT chest 03/11/2007.  PA and lateral chest 02/29/2008. FINDINGS: The chest is hyperexpanded with attenuation of the pulmonary vasculature. Lungs are clear. Heart size is normal. Aortic atherosclerosis noted. No pneumothorax or pleural effusion. IMPRESSION: No acute disease. Findings compatible with COPD. Atherosclerosis. Electronically Signed   By: Inge Rise M.D.   On: 02/07/2017 17:02     Cardiac Studies   ECHO: 02/08/2017 ordered   Patient Profile     76 y.o. male w/ hx HTN, COPD, PAF>>persistent afib, tob use, CHADS2VQASC=2 (HTN, age x 1)  on Xarelto, was admitted  08/05 with SOB and afib RVR.   Assessment & Plan    Atrial fibrillation with RVR: -continue cardizem drip, hold PO Cardizem 240 mg while on drip. Once rate is better controlled, change to po, currently at 15 mg/hr= 360 mg qd. Continue gtt for now. -continue rivaroxaban -monitor on telemetry -rate likely driven by his COPD exacerbation as well so HR approx 100 is ok -per Dr. Kennon Holter note, plan was for rate control.  -amiodarone, beta blocker not ideal given his lung disease/COPD but could be considered if he remains refractory.  COPD exacerbation: being managed by primary team. Spoke w/ pt and family re: tobacco cessation. Hopefully, he will be motivated to quit. It will not be easy.  Labs consistent with hemoconcentration, improved after IVF.  Hypertension: Home benazepril 40 mg qd on hold, clonidine at home dose. - SBP 130s-150s - will restart benazepril at 20 mg qd, titrate up at BP will allow - would prefer to taper/stop clonidine if BP will not tolerate full home dose of rx  Otherwise, per IM  Principal Problem:   Acute respiratory failure with hypoxia and hypercapnia (HCC) Active Problems:   Hypertension   COPD exacerbation ([place])   Atrial fibrillation with rapid ventricular response ([place])    Signed, Rosaria Ferries , PA-C 9:56 AM 02/08/2017 Pager: [phone] As above, patient seen and examined. His respiratory status is improving. This appears to be predominantly related to COPD/bronchitis. Continue antibiotics, bronchodilators and steroids per primary care. He remains in atrial fibrillation with elevated heart rate. I think his tachycardia is at least partially related to COPD flare. He was asymptomatic prior to developing bronchitis and the plan had been rate control and anticoagulation. Discontinue IV Cardizem. Treat with Cardizem 120 mg every 6 hours. Follow heart rate and adjust regimen as needed. I would like to avoid beta-blockade given COPD. He develops symptoms  that we feel are related to his atrial fibrillation once his pulmonary status improves we could consider another try at rhythm control in the future. Continue xarelto. As we are increasing his Cardizem I will decrease clonidine to 0.1 mg daily and hopefully we can discontinue this medication altogether. I would like to consolidate his medications if possible. Kirk Ruths, MD

## 2017-02-08 NOTE — Progress Notes (Signed)
Nutrition Brief Note  Received MD consult from the COPD Gold Protocol.  Chart reviewed. ~5% weight loss within the past 12 months is not significant for the time frame. Nutrition-Focused physical exam completed. Findings are no fat depletion, no muscle depletion, and no edema.  Patient reports good intake at home with no nutritional concerns.  Wt Readings from Last 5 Encounters:  02/08/17 189 lb 9.6 oz (86 kg)  02/03/17 188 lb 3.2 oz (85.4 kg)  10/12/16 192 lb 12.8 oz (87.5 kg)  04/08/16 199 lb (90.3 kg)  01/09/16 198 lb 3.2 oz (89.9 kg)    Body mass index is 25.01 kg/m. Patient meets criteria for normal weight based on current BMI.   Current diet order is NPO for possible procedure. He is hungry. Labs and medications reviewed.   No nutrition interventions warranted at this time. If nutrition issues arise, please consult RD.   Molli Barrows, RD, LDN, [place] Pager [phone] After Hours Pager [phone]

## 2017-02-08 NOTE — Progress Notes (Signed)
  Echocardiogram 2D Echocardiogram has been performed.  Antonio Wise 02/08/2017, 5:40 PM

## 2017-02-08 NOTE — Evaluation (Signed)
Physical Therapy Evaluation Patient Details Name: Antonio Wise MRN: [id number] DOB: [birth year] Today's Date: 02/08/2017   History of Present Illness  Pt adm with copd exacerbation and afib with rvr and copd exacerbation. PMH - HTN, copd,   Clinical Impression  Pt admitted with above diagnosis and presents to PT with functional limitations due to deficits listed below (See PT problem list). Pt needs skilled PT to maximize independence and safety to allow discharge to home with intermittent help from family as needed. Expect pt will be back to baseline with mobility relatively quickly.     Follow Up Recommendations No PT follow up;Supervision - Intermittent    Equipment Recommendations  None recommended by PT    Recommendations for Other Services       Precautions / Restrictions Precautions Precautions: None Restrictions Weight Bearing Restrictions: No      Mobility  Bed Mobility Overal bed mobility: Modified Independent             General bed mobility comments: Incr time  Transfers Overall transfer level: Needs assistance Equipment used: None Transfers: Sit to/from Stand Sit to Stand: Supervision            Ambulation/Gait Ambulation/Gait assistance: Min guard;Min assist Ambulation Distance (Feet): 100 Feet Assistive device: None Gait Pattern/deviations: Step-through pattern;Decreased stride length;Drifts right/left Gait velocity: decr Gait velocity interpretation: Below normal speed for age/gender General Gait Details: slightly unsteady gait. 1 overt loss of balance requiring min assist to correct. Amb on 3L of O2 with SpO2 96% or greater. HR generally 120's with amb with brief time at 147.  Stairs            Wheelchair Mobility    Modified Rankin (Stroke Patients Only)       Balance Overall balance assessment: Needs assistance Sitting-balance support: No upper extremity supported;Feet supported Sitting balance-Leahy Scale: Good      Standing balance support: No upper extremity supported Standing balance-Leahy Scale: Fair                               Pertinent Vitals/Pain Pain Assessment: No/denies pain    Home Living Family/patient expects to be discharged to:: Private residence Living Arrangements: Alone Available Help at Discharge: Family;Available PRN/intermittently (daughter lives across the street) Type of Home: House Home Access: Level entry     Home Layout: One level Home Equipment: None      Prior Function Level of Independence: Independent               Hand Dominance        Extremity/Trunk Assessment   Upper Extremity Assessment Upper Extremity Assessment: Defer to OT evaluation    Lower Extremity Assessment Lower Extremity Assessment: Generalized weakness       Communication   Communication: No difficulties  Cognition Arousal/Alertness: Awake/alert Behavior During Therapy: WFL for tasks assessed/performed Overall Cognitive Status: Within Functional Limits for tasks assessed                                        General Comments      Exercises     Assessment/Plan    PT Assessment Patient needs continued PT services  PT Problem List Decreased strength;Decreased activity tolerance;Decreased balance;Decreased mobility       PT Treatment Interventions DME instruction;Gait training;Functional mobility training;Therapeutic activities;Therapeutic exercise;Balance training;Patient/family education  PT Goals (Current goals can be found in the Care Plan section)  Acute Rehab PT Goals Patient Stated Goal: return home PT Goal Formulation: With patient Time For Goal Achievement: 02/15/17 Potential to Achieve Goals: Good    Frequency Min 3X/week   Barriers to discharge Decreased caregiver support Lives alone but daughter across the street and checks on him    Co-evaluation               AM-PAC PT "6 Clicks" Daily Activity   Outcome Measure Difficulty turning over in bed (including adjusting bedclothes, sheets and blankets)?: None Difficulty moving from lying on back to sitting on the side of the bed? : None Difficulty sitting down on and standing up from a chair with arms (e.g., wheelchair, bedside commode, etc,.)?: A Little Help needed moving to and from a bed to chair (including a wheelchair)?: A Little Help needed walking in hospital room?: A Little Help needed climbing 3-5 steps with a railing? : A Little 6 Click Score: 20    End of Session Equipment Utilized During Treatment: Oxygen Activity Tolerance: Patient tolerated treatment well Patient left: in chair;with call bell/phone within reach Nurse Communication: Mobility status PT Visit Diagnosis: Unsteadiness on feet (R26.81);Muscle weakness (generalized) (M62.81)    Time: 2542-7062 PT Time Calculation (min) (ACUTE ONLY): 23 min   Charges:   PT Evaluation $PT Eval Moderate Complexity: 1 Mod     PT G Codes:        [facility] PT [place] 02/08/2017, 2:05 PM

## 2017-02-08 NOTE — Plan of Care (Signed)
Problem: Education: Goal: Knowledge of [facility] Education information/materials will improve Outcome: Progressing Verbalized understanding  Problem: Safety: Goal: Ability to remain free from injury will improve Outcome: Progressing Verbalized understanding  Problem: Health Behavior/Discharge Planning: Goal: Ability to manage health-related needs will improve Outcome: Progressing Verbalized understanding  Problem: Pain Managment: Goal: General experience of comfort will improve Outcome: Progressing Verbalized understanding  Problem: Physical Regulation: Goal: Ability to maintain clinical measurements within normal limits will improve Outcome: Progressing Verbalized understanding Goal: Will remain free from infection Outcome: Progressing Verbalized understanding

## 2017-02-09 LAB — ECHOCARDIOGRAM COMPLETE
FS: 32 % (ref 28–44)
IVS/LV PW RATIO, ED: 1
LA diam index: 1.42 cm/m2
LASIZE: 30 mm
LEFT ATRIUM END SYS DIAM: 30 mm
LVOT area: 2.27 cm2
LVOTD: 17 mm
PW: 10 mm — AB (ref 0.6–1.1)
WEIGHTICAEL: 3033.6 [oz_av]

## 2017-02-09 MED ORDER — DIGOXIN 0.25 MG/ML IJ SOLN
0.2500 mg | Freq: Once | INTRAMUSCULAR | Status: AC
  Administered 2017-02-09: 0.25 mg via INTRAVENOUS
  Filled 2017-02-09: qty 2

## 2017-02-09 MED ORDER — DILTIAZEM HCL ER COATED BEADS 240 MG PO CP24
480.0000 mg | ORAL_CAPSULE | Freq: Every day | ORAL | Status: DC
  Administered 2017-02-09 – 2017-02-11 (×3): 480 mg via ORAL
  Filled 2017-02-09 (×3): qty 2

## 2017-02-09 NOTE — Progress Notes (Signed)
PROGRESS NOTE  Antonio Wise LFY:[id number] DOB: 12/26/1940 DOA: 02/07/2017 PCP: [name], PA-C   LOS: 2 days   Brief Narrative / Interim history: 76 yo male with medical history significant for HTN, COPD not on home oxygen, current smoker, PAF on chronic anticoagulation presented with worsening SOB associated and greenish phlegm X3 days since discontinuing his antiarrhythmic in favor of rate control.  In the ED, patient was found to be in Afib with RVR in the 160's and O2 Sat 76% ORA. CXR showed no acute process and existing COPD. Patient was started on diltiazem drip and antibiotics and admitted for further evaluation with cardiology consult.  Assessment & Plan: Principal Problem:   Acute respiratory failure with hypoxia and hypercapnia (HCC) Active Problems:   Hypertension   COPD exacerbation (HCC)   Atrial fibrillation with rapid ventricular response (HCC)  Atrial fibrillation with rapid ventricular response -Likely acute COPD exacerbation & discontinuation of antiarrhythmic.CHADS2VASc=2 (HTN, age X52). -Follow up on (8/6) echo results - still pending.  -Continue rate control (PO Cardizem); anticoagulation (PO Xarelto) as per cardiology.   Acute Respiratory Failure with Hypoxia and Hypercapnia -2/2  COPD acute exacerbation. COPD gold. ED CXR - no acute process & consistent with COPD. -Continue to wean off [place] O2 as tolerated - does not use home oxygen. -Continue IV Ceftriaxone, IV Azithromycin, Bronchodilators, Solu-medrol. -Follow-up on blood cultures. MRSA negative. -Continue to ambulate.  Essential Hypertension, improving -Continue Benazepril. Discontinue Clonidine (as per cardiology). -Monitor.   Tobacco dependence -Counseled regarding cessation. -Continue Nicotine patch.  Leukocytosis and Hyperglycemia -Likely in setting of above / steroid treatment. -Monitor.  DVT prophylaxis: Rivaroxaban (Xarelto) Code Status: Full Family Communication: Daughters at  bedside. Disposition Plan: Discharge once stable  Consultants:   Cardiology  Care Management   Respiratory Care Treatment  Procedures:   2D echo: 8/6 Echo pending results  Antimicrobials: Ceftriaxone Azithromycin  Subjective: Patient breathing better today and able to ambulate. Denies CP. Does not report symptoms associated with Afib and RVR. States he is aware smoking caused this exacerbation and is willing to start making changes to prevent future occurrence.  Objective: Vitals:   02/08/17 2327 02/09/17 0431 02/09/17 0804 02/09/17 1025  BP: 107/77 103/79  137/74  Pulse:  (!) 101    Resp:  (!) 29    Temp:  97.7 F (36.5 C)    TempSrc:  Oral    SpO2:  97% 96%   Weight:  87.5 kg (192 lb 12.8 oz)    Height:  6\' 1"  (1.854 m)      Intake/Output Summary (Last 24 hours) at 02/09/17 1030 Last data filed at 02/09/17 0832  Gross per 24 hour  Intake              780 ml  Output              775 ml  Net                5 ml   Filed Weights   02/08/17 0602 02/09/17 0431  Weight: 86 kg (189 lb 9.6 oz) 87.5 kg (192 lb 12.8 oz)    Examination:  Vitals:   02/08/17 2327 02/09/17 0431 02/09/17 0804 02/09/17 1025  BP: 107/77 103/79  137/74  Pulse:  (!) 101    Resp:  (!) 29    Temp:  97.7 F (36.5 C)    TempSrc:  Oral    SpO2:  97% 96%   Weight:  87.5 kg (192 lb  12.8 oz)    Height:  6\' 1"  (1.854 m)      Constitutional: NAD Eyes: PERRL, lids and conjunctivae normal Neck: normal, supple, no masses Respiratory: On [place]. Decreased breath sounds.  Cardiovascular: Irregularly irregular, tachycardic. no murmurs / rubs / gallops.   Abdomen: no tenderness. Bowel sounds positive.  Musculoskeletal: no clubbing / cyanosis. No joint deformity upper and lower extremities.  Skin: no rashes, lesions, ulcers. Psychiatric: Normal judgment and insight. Normal mood.    Data Reviewed: I have independently reviewed following labs and imaging studies   CBC:  Recent Labs Lab  02/18/17 1520 02/08/17 0654  WBC 22.2* 13.8*  HGB 17.2* 14.3  HCT 54.8* 47.0  MCV 100.0 100.4*  PLT 377 527   Basic Metabolic Panel:  Recent Labs Lab 02-18-17 1520 02/08/17 0654  NA 142 141  K 4.0 4.8  CL 102 102  CO2 25 28  GLUCOSE 192* 191*  BUN 18 20  CREATININE 1.26* 1.12  CALCIUM 9.3 8.9   GFR: Estimated Creatinine Clearance: 65.4 mL/min (by C-G formula based on SCr of 1.12 mg/dL). Liver Function Tests: No results for input(s): AST, ALT, ALKPHOS, BILITOT, PROT, ALBUMIN in the last 168 hours. No results for input(s): LIPASE, AMYLASE in the last 168 hours. No results for input(s): AMMONIA in the last 168 hours. Coagulation Profile: No results for input(s): INR, PROTIME in the last 168 hours. Cardiac Enzymes: No results for input(s): CKTOTAL, CKMB, CKMBINDEX, TROPONINI in the last 168 hours. BNP (last 3 results) No results for input(s): PROBNP in the last 8760 hours. HbA1C: No results for input(s): HGBA1C in the last 72 hours. CBG:  Recent Labs Lab 02/08/17 1157  GLUCAP 168*   Lipid Profile: No results for input(s): CHOL, HDL, LDLCALC, TRIG, CHOLHDL, LDLDIRECT in the last 72 hours. Thyroid Function Tests: No results for input(s): TSH, T4TOTAL, FREET4, T3FREE, THYROIDAB in the last 72 hours. Anemia Panel: No results for input(s): VITAMINB12, FOLATE, FERRITIN, TIBC, IRON, RETICCTPCT in the last 72 hours. Urine analysis:    Component Value Date/Time   COLORURINE YELLOW 10/11/2015 1212   APPEARANCEUR CLEAR 10/11/2015 1212   LABSPEC 1.020 10/11/2015 1212   PHURINE 5.5 10/11/2015 1212   GLUCOSEU NEGATIVE 10/11/2015 1212   HGBUR NEGATIVE 10/11/2015 1212   BILIRUBINUR NEGATIVE 10/11/2015 1212   KETONESUR NEGATIVE 10/11/2015 1212   PROTEINUR TRACE (A) 10/11/2015 1212   NITRITE NEGATIVE 10/11/2015 1212   LEUKOCYTESUR NEGATIVE 10/11/2015 1212   Sepsis Labs: Invalid input(s): PROCALCITONIN, LACTICIDVEN  Recent Results (from the past 240 hour(s))  Blood  culture (routine x 2)     Status: None (Preliminary result)   Collection Time: 02/18/2017  5:50 PM  Result Value Ref Range Status   Specimen Description BLOOD RIGHT ANTECUBITAL  Final   Special Requests   Final    BOTTLES DRAWN AEROBIC AND ANAEROBIC Blood Culture adequate volume   Culture NO GROWTH < 24 HOURS  Final   Report Status PENDING  Incomplete  Blood culture (routine x 2)     Status: None (Preliminary result)   Collection Time: 2017/02/18  6:11 PM  Result Value Ref Range Status   Specimen Description BLOOD LEFT HAND  Final   Special Requests IN PEDIATRIC BOTTLE Blood Culture adequate volume  Final   Culture NO GROWTH < 24 HOURS  Final   Report Status PENDING  Incomplete  MRSA PCR Screening     Status: None   Collection Time: 02/08/17  4:18 AM  Result Value Ref Range Status  MRSA by PCR NEGATIVE NEGATIVE Final    Comment:        The GeneXpert MRSA Assay (FDA approved for NASAL specimens only), is one component of a comprehensive MRSA colonization surveillance program. It is not intended to diagnose MRSA infection nor to guide or monitor treatment for MRSA infections.       Radiology Studies: Dg Chest Portable 1 View  Result Date: 02/07/2017 CLINICAL DATA:  Shortness of breath for 3 days. EXAM: PORTABLE CHEST 1 VIEW COMPARISON:  CT chest 03/11/2007.  PA and lateral chest 02/29/2008. FINDINGS: The chest is hyperexpanded with attenuation of the pulmonary vasculature. Lungs are clear. Heart size is normal. Aortic atherosclerosis noted. No pneumothorax or pleural effusion. IMPRESSION: No acute disease. Findings compatible with COPD. Atherosclerosis. Electronically Signed   By: Inge Rise M.D.   On: 02/07/2017 17:02     Scheduled Meds: . atorvastatin  40 mg Oral q1800  . benazepril  20 mg Oral Daily  . chlorhexidine  15 mL Mouth Rinse BID  . cholecalciferol  4,000 Units Oral Daily  . diltiazem  480 mg Oral Daily  . famotidine  10 mg Oral BID  . fluticasone  furoate-vilanterol  1 puff Inhalation Daily  . mouth rinse  15 mL Mouth Rinse q12n4p  . methylPREDNISolone (SOLU-MEDROL) injection  60 mg Intravenous Q12H  . nicotine  14 mg Transdermal Daily  . rivaroxaban  20 mg Oral Q supper   Continuous Infusions: . azithromycin Stopped (02/08/17 1925)  . cefTRIAXone (ROCEPHIN)  IV Stopped (02/08/17 1802)    Marrianne Mood, Student-PA  02/09/17 10:30 AM

## 2017-02-09 NOTE — Plan of Care (Signed)
Problem: Activity: Goal: Ability to tolerate increased activity will improve Outcome: Progressing Pt amble to ambulate to the bathroom.

## 2017-02-09 NOTE — Progress Notes (Signed)
Progress Note  Patient Name: Antonio Wise Date of Encounter: 02/09/2017  Primary Cardiologist: Dr Gwenlyn Found  Subjective   Dyspnea continues to improve. Patient denies chest pain. No palpitations.  Inpatient Medications    Scheduled Meds: . atorvastatin  40 mg Oral q1800  . benazepril  20 mg Oral Daily  . chlorhexidine  15 mL Mouth Rinse BID  . cholecalciferol  4,000 Units Oral Daily  . cloNIDine  0.1 mg Oral Daily  . diltiazem  120 mg Oral Q6H  . famotidine  10 mg Oral BID  . fluticasone furoate-vilanterol  1 puff Inhalation Daily  . mouth rinse  15 mL Mouth Rinse q12n4p  . methylPREDNISolone (SOLU-MEDROL) injection  60 mg Intravenous Q12H  . nicotine  14 mg Transdermal Daily  . rivaroxaban  20 mg Oral Q supper   Continuous Infusions: . azithromycin Stopped (02/08/17 1925)  . cefTRIAXone (ROCEPHIN)  IV Stopped (02/08/17 1802)   PRN Meds: acetaminophen **OR** acetaminophen, levalbuterol, ondansetron **OR** ondansetron (ZOFRAN) IV, polyethylene glycol   Vital Signs    Vitals:   02/08/17 2131 02/08/17 2327 02/09/17 0431 02/09/17 0804  BP: 111/77 107/77 103/79   Pulse: (!) 134  (!) 101   Resp: 18  (!) 29   Temp: 97.9 F (36.6 C)  97.7 F (36.5 C)   TempSrc: Oral  Oral   SpO2: 97%  97% 96%  Weight:   87.5 kg (192 lb 12.8 oz)   Height:   6\' 1"  (1.854 m)     Intake/Output Summary (Last 24 hours) at 02/09/17 0853 Last data filed at 02/09/17 2409  Gross per 24 hour  Intake              780 ml  Output             1175 ml  Net             -395 ml   Filed Weights   02/08/17 0602 02/09/17 0431  Weight: 86 kg (189 lb 9.6 oz) 87.5 kg (192 lb 12.8 oz)    Telemetry    Atrial fibrillation; rate upper normal for the most part- Personally Reviewed    Physical Exam   GEN: WD/WN No acute distress.   Neck: supple Cardiac: irregular Respiratory: Diminished BS throughout GI: Soft, nontender, non-distended  MS: No edema Neuro:  Nonfocal  Psych: Normal affect    Labs    Chemistry Recent Labs Lab 02/07/17 1520 02/08/17 0654  NA 142 141  K 4.0 4.8  CL 102 102  CO2 25 28  GLUCOSE 192* 191*  BUN 18 20  CREATININE 1.26* 1.12  CALCIUM 9.3 8.9  GFRNONAA 54* >60  GFRAA >60 >60  ANIONGAP 15 11     Hematology Recent Labs Lab 02/07/17 1520 02/08/17 0654  WBC 22.2* 13.8*  RBC 5.48 4.68  HGB 17.2* 14.3  HCT 54.8* 47.0  MCV 100.0 100.4*  MCH 31.4 30.6  MCHC 31.4 30.4  RDW 13.7 14.1  PLT 377 292     Recent Labs Lab 02/07/17 1656  TROPIPOC 0.00     BNP Recent Labs Lab 02/07/17 1645  BNP 295.9*        Radiology    Dg Chest Portable 1 View  Result Date: 02/07/2017 CLINICAL DATA:  Shortness of breath for 3 days. EXAM: PORTABLE CHEST 1 VIEW COMPARISON:  CT chest 03/11/2007.  PA and lateral chest 02/29/2008. FINDINGS: The chest is hyperexpanded with attenuation of the pulmonary vasculature. Lungs are clear. Heart  size is normal. Aortic atherosclerosis noted. No pneumothorax or pleural effusion. IMPRESSION: No acute disease. Findings compatible with COPD. Atherosclerosis. Electronically Signed   By: Inge Rise M.D.   On: 02/07/2017 17:02     Patient Profile     76 y.o. male w/ hx HTN, COPD, PAF>>persistent afib, tob use, CHADS2VQASC=2 (HTN, age x 1)  on Xarelto, was admitted 08/05 with SOB and afib RVR. Also with COPD flare/bronchitis.  Assessment & Plan    1 atrial fibrillation-plan is rate control and anticoagulation. Continue xarelto. Heart rate is high normal. This is partially driven by his COPD flare/bronchitis. I will change Cardizem to 480 mg CD daily. Follow heart rate and adjust regimen as needed. I would like to avoid beta-blockade given severity of lung disease. If patient develops symptoms related to his atrial fibrillation following treatment of his COPD/bronchitis we could attempt rhythm control at that point.  2 COPD/bronchitis-continue antibiotics, steroids and bronchodilators per primary care.  3  hypertension-blood pressure is borderline given addition of Cardizem. Discontinue clonidine and follow.  4 tobacco abuse-patient previously counseled on discontinuing.  Signed, Kirk Ruths, MD  02/09/2017, 8:53 AM

## 2017-02-09 NOTE — Evaluation (Signed)
Occupational Therapy Evaluation and Discharge Patient Details Name: Antonio Wise MRN: [id number] DOB: March 03, 1941 Today's Date: 02/09/2017    History of Present Illness Pt adm with copd exacerbation and afib with rvr and copd exacerbation. PMH - HTN, copd,    Clinical Impression   Pt presents with decreased activity tolerance and is slightly unsteady with standing/ambulating. He is overall functioning at a supervision level in ADL. 02 sats noted to drop to 83% on RA with grooming at sink, HR in low 120s non sustained. Instructed in pursed lip breathing and energy conservation strategies. Rebounded to mid 90s with replacement of 02. Pt would benefit from pulmonary rehab, but is not interested. No further OT needs.    Follow Up Recommendations  No OT follow up    Equipment Recommendations  None recommended by OT    Recommendations for Other Services       Precautions / Restrictions Precautions Precautions: None Restrictions Weight Bearing Restrictions: No      Mobility Bed Mobility Overal bed mobility: Modified Independent             General bed mobility comments: hob up  Transfers Overall transfer level: Needs assistance Equipment used: None Transfers: Sit to/from Stand Sit to Stand: Supervision         General transfer comment: slightly unstable    Balance Overall balance assessment: Needs assistance   Sitting balance-Leahy Scale: Good     Standing balance support: No upper extremity supported Standing balance-Leahy Scale: Fair                             ADL either performed or assessed with clinical judgement   ADL Overall ADL's : Needs assistance/impaired Eating/Feeding: Independent;Bed level   Grooming: Supervision/safety;Standing;Wash/dry hands;Oral care;Brushing hair   Upper Body Bathing: Set up;Sitting   Lower Body Bathing: Supervison/ safety;Sit to/from stand   Upper Body Dressing : Set up;Sitting   Lower Body Dressing:  Supervision/safety;Sit to/from stand   Toilet Transfer: Public house manager Details (indicate cue type and reason): stood to urinate Toileting- Water quality scientist and Hygiene: Modified independent       Functional mobility during ADLs: Supervision/safety General ADL Comments: educated pt in energy conservation and pursed lip breathing, reinforced with handout, recommended pt consider pulmonary rehab     Vision Patient Visual Report: No change from baseline       Perception     Praxis      Pertinent Vitals/Pain Pain Assessment: No/denies pain     Hand Dominance Right   Extremity/Trunk Assessment Upper Extremity Assessment Upper Extremity Assessment: Overall WFL for tasks assessed   Lower Extremity Assessment Lower Extremity Assessment: Defer to PT evaluation   Cervical / Trunk Assessment Cervical / Trunk Assessment: Normal   Communication Communication Communication: No difficulties   Cognition Arousal/Alertness: Awake/alert Behavior During Therapy: WFL for tasks assessed/performed Overall Cognitive Status: Within Functional Limits for tasks assessed                                     General Comments       Exercises     Shoulder Instructions      Home Living Family/patient expects to be discharged to:: Private residence Living Arrangements: Alone Available Help at Discharge: Family;Available PRN/intermittently (daughters) Type of Home: House Home Access: Level entry     Home Layout: One level  Bathroom Toilet: Standard     Home Equipment: Shower seat          Prior Functioning/Environment Level of Independence: Independent                 OT Problem List: Impaired balance (sitting and/or standing);Decreased activity tolerance      OT Treatment/Interventions:      OT Goals(Current goals can be found in the care plan section) Acute Rehab OT Goals Patient Stated Goal: return  home  OT Frequency:     Barriers to D/C:            Co-evaluation              AM-PAC PT "6 Clicks" Daily Activity     Outcome Measure Help from another person eating meals?: None Help from another person taking care of personal grooming?: A Little Help from another person toileting, which includes using toliet, bedpan, or urinal?: A Little Help from another person bathing (including washing, rinsing, drying)?: A Little Help from another person to put on and taking off regular upper body clothing?: None Help from another person to put on and taking off regular lower body clothing?: A Little 6 Click Score: 20   End of Session    Activity Tolerance: Patient tolerated treatment well Patient left: in bed;with call bell/phone within reach;with family/visitor present  OT Visit Diagnosis: Unsteadiness on feet (R26.81)                Time: 3143-8887 OT Time Calculation (min): 29 min Charges:  OT General Charges $OT Visit: 1 Procedure OT Evaluation $OT Eval Moderate Complexity: 1 Procedure OT Treatments $Self Care/Home Management : 8-22 mins G-Codes:      Malka So 02/09/2017, 10:30 AM  [phone]

## 2017-02-09 NOTE — Progress Notes (Signed)
Physical Therapy Treatment Patient Details Name: Antonio Wise MRN: [id number] DOB: 05-May-1941 Today's Date: 02/09/2017    History of Present Illness Pt adm with copd exacerbation and afib with rvr and copd exacerbation. PMH - HTN, copd,     PT Comments    Pt tolerated ambulation well with no endorsed symptoms other than mild fatigue and SOB.  O2 and HR monitored throughout session, range 76%-99% on 1-3L (adjusted per needs), HR range 135-155 consistently with 1 episode during stand>sit of 175 (RN notified).  PT instructed pt in pacing and pursed lip breathing.  Would benefit from continued acute PT and HEP at d/c.     Follow Up Recommendations  No PT follow up;Supervision - Intermittent     Equipment Recommendations  None recommended by PT    Recommendations for Other Services       Precautions / Restrictions Precautions Precautions: Other (comment) Precaution Comments: watch HR and O2 Restrictions Weight Bearing Restrictions: No    Mobility  Bed Mobility Overal bed mobility: Modified Independent             General bed mobility comments: hob up  Transfers Overall transfer level: Needs assistance Equipment used: None Transfers: Sit to/from Stand Sit to Stand: Supervision            Ambulation/Gait Ambulation/Gait assistance: Supervision;Min guard Ambulation Distance (Feet): 200 Feet Assistive device: None Gait Pattern/deviations: Step-through pattern;Decreased stride length;Drifts right/left Gait velocity: decr   General Gait Details: 2 short standing rest breaks to recover O2   Stairs            Wheelchair Mobility    Modified Rankin (Stroke Patients Only)       Balance                                            Cognition Arousal/Alertness: Awake/alert Behavior During Therapy: WFL for tasks assessed/performed Overall Cognitive Status: Within Functional Limits for tasks assessed                                        Exercises      General Comments        Pertinent Vitals/Pain Pain Assessment: No/denies pain    Home Living                      Prior Function            PT Goals (current goals can now be found in the care plan section) Acute Rehab PT Goals Patient Stated Goal: return home PT Goal Formulation: With patient Time For Goal Achievement: 02/15/17 Potential to Achieve Goals: Good    Frequency    Min 3X/week      PT Plan Current plan remains appropriate    Co-evaluation              AM-PAC PT "6 Clicks" Daily Activity  Outcome Measure  Difficulty turning over in bed (including adjusting bedclothes, sheets and blankets)?: None Difficulty moving from lying on back to sitting on the side of the bed? : None Difficulty sitting down on and standing up from a chair with arms (e.g., wheelchair, bedside commode, etc,.)?: None Help needed moving to and from a bed to chair (including a wheelchair)?: None Help needed walking  in hospital room?: A Little Help needed climbing 3-5 steps with a railing? : A Little 6 Click Score: 22    End of Session Equipment Utilized During Treatment: Oxygen Activity Tolerance: Patient tolerated treatment well Patient left: in bed;with call bell/phone within reach Nurse Communication: Mobility status;Other (comment) (vitals) PT Visit Diagnosis: Unsteadiness on feet (R26.81);Muscle weakness (generalized) (M62.81)     Time: 6761-9509 PT Time Calculation (min) (ACUTE ONLY): 27 min  Charges:  $Gait Training: 8-22 mins $Therapeutic Activity: 8-22 mins                    G Codes:         Viraaj Vorndran E Penven-Crew 02/09/2017, 2:28 PM

## 2017-02-09 NOTE — Consult Note (Signed)
[facility] CM Primary Care Navigator  02/09/2017  Antonio Wise [birth year] [id number]   Met with patient and daughter Antonio Wise) at the bedside to identify possible discharge needs.  Patient stateshaving shortness of breath and productive coughingthat had led to this admission. Patient endorses Antonio Wise, [place] with  Antonio Wise as theprimary care provider.   [facility] [address] [facility] so far. He reports that primary care provider gives samples or attempts to substitute prescribed medication if patient is having difficulty with it.  Patient states managing his ownmedications at home using "pill box" system filled every 2 weeks.   Patient mentioned that he drives prior to admission but daughter can providetransportation tohis doctors'appointments after discharge.  He lives alone and independent with care. Daughter lives across his house and will serve as his primary caregiver at home. Daughter states that he has other family members who can also provide assistance with care if needed.    Anticipateddischarge plan ishome when improved  per patient.  Patient and daughter expressedunderstanding to call hisprimary care provider's officewhen he gets home,for a post discharge follow-upwithin a week or sooner if needed.Patient letter (with PCP's contact number) was provided as a reminder.  Discussed withpatient and daughter regarding[facility] CM services available for health management (particularly COPD). Explained to patient and daughter about COPD Action Plan as well. Patient had verbally agreedand opted[facility] COPDcalls to help manage at home and follow him up while he is recovering.   Referral was made for [facility] COPDcalls after discharge.  Daughter and patient voiced understandingto seek referral from primary care provider to [facility] care management if deemed necessaryfor  services in the future.  [facility] care management information provided for future needs that may arise.   For questions, please contact:  Dannielle Huh, BSN, RN- [facility] Primary Care Navigator  Telephone: [phone] Antonio Wise

## 2017-02-10 MED ORDER — FUROSEMIDE 20 MG PO TABS
20.0000 mg | ORAL_TABLET | Freq: Every day | ORAL | Status: DC
  Administered 2017-02-10 – 2017-02-11 (×2): 20 mg via ORAL
  Filled 2017-02-10 (×2): qty 1

## 2017-02-10 MED ORDER — METOPROLOL TARTRATE 12.5 MG HALF TABLET
12.5000 mg | ORAL_TABLET | Freq: Two times a day (BID) | ORAL | Status: DC
Start: 2017-02-10 — End: 2017-02-11
  Administered 2017-02-10 (×2): 12.5 mg via ORAL
  Filled 2017-02-10 (×2): qty 1

## 2017-02-10 MED ORDER — CLONIDINE HCL 0.1 MG PO TABS
0.1000 mg | ORAL_TABLET | Freq: Every day | ORAL | Status: DC
  Administered 2017-02-10 – 2017-02-11 (×2): 0.1 mg via ORAL
  Filled 2017-02-10 (×2): qty 1

## 2017-02-10 NOTE — Progress Notes (Signed)
[facility]  PROGRESS NOTE  Antonio Wise:[id number] DOB: February 12, 1941 DOA: 02/07/2017 PCP: [name], PA-C   Brief HPI:    76 y.o.malewith medical history significant of hypertension, COPD not on home oxygen, active smoker, paroxysmal atrial fibrillation on chronic anticoagulation, presented with worsening shortness of breath associated with greenish phlegm for about 3 days. He was seen by his cardiologist 3 days prior to admission when one of his arrhythmia medication rythmol was discontinued. In ER, patient was found to have A. fib with RVR with heart rate up to 160s, COPD exacerbation and hypoxia   Subjective   Patient seen and examined, denies chest pain or shortness of breath.   Assessment/Plan:     1. Atrial fibrillation with RVR-in the setting of COPD exacerbation. Patient is currently on Cardizem cardiology actively managing his medications. Continue anticoagulation with Xarelto. 2. Acute respiratory failure with hypoxia and hypercapnia- due to COPD exacerbation. Continue Solu-Medrol, oxygen via nasal cannula. 3. Hypertension-blood pressure is controlled, continue diltiazem. Low-dose metoprolol started per cardiology. 4. Leukocytosis- resolved, patient's WBC went down from 22,000 to 13,000.    DVT prophylaxis: Anti-correlation with Xarelto  Code Status: Full code  Family Communication: no family at bedside   Disposition Plan: Home in 1-2 days   Consultants:  Cardiology  Procedures:  None  Continuous infusions . azithromycin Stopped (02/10/17 1941)  . cefTRIAXone (ROCEPHIN)  IV Stopped (02/10/17 1805)      Antibiotics:   Anti-infectives    Start     Dose/Rate Route Frequency Ordered Stop   02/08/17 1800  cefTRIAXone (ROCEPHIN) 1 g in dextrose 5 % 50 mL IVPB     1 g 100 mL/hr over 30 Minutes Intravenous Every 24 hours 02/07/17 1820     02/08/17 1800  azithromycin (ZITHROMAX) 500 mg in dextrose 5 % 250 mL IVPB     500 mg 250 mL/hr over 60  Minutes Intravenous Every 24 hours 02/07/17 1820     02/07/17 1715  cefTRIAXone (ROCEPHIN) 1 g in dextrose 5 % 50 mL IVPB     1 g 100 mL/hr over 30 Minutes Intravenous  Once 02/07/17 1712 02/07/17 1843   02/07/17 1715  azithromycin (ZITHROMAX) 500 mg in dextrose 5 % 250 mL IVPB     500 mg 250 mL/hr over 60 Minutes Intravenous  Once 02/07/17 1712 02/07/17 1849       Objective   Vitals:   02/10/17 1009 02/10/17 1131 02/10/17 1735 02/10/17 2000  BP: (!) 159/89 (!) 140/92 (!) 146/86 (!) 132/91  Pulse: (!) 122  79 (!) 107  Resp:   18   Temp:   97.7 F (36.5 C) 98.2 F (36.8 C)  TempSrc:  Oral Oral Oral  SpO2:  95% 94% 96%  Weight:      Height:        Intake/Output Summary (Last 24 hours) at 02/10/17 2033 Last data filed at 02/10/17 1841  Gross per 24 hour  Intake             1002 ml  Output             2000 ml  Net             -998 ml   Filed Weights   02/08/17 0602 02/09/17 0431  Weight: 86 kg (189 lb 9.6 oz) 87.5 kg (192 lb 12.8 oz)     Physical Examination:   Physical Exam: Eyes: No icterus, extraocular muscles intact  Mouth: Oral mucosa is moist,  no lesions on palate,  Neck: Supple, no deformities, masses, or tenderness Lungs: Normal respiratory effort, bilateral clear to auscultation, no crackles or wheezes.  Heart: Irregular rhythm, S1 and S2 normal, no murmurs, rubs auscultated Abdomen: BS normoactive,soft,nondistended,non-tender to palpation,no organomegaly Extremities: No pretibial edema, no erythema, no cyanosis, no clubbing Neuro : Alert and oriented to time, place and person, No focal deficits      Data Reviewed: I have personally reviewed following labs and imaging studies  CBG:  Recent Labs Lab 02/08/17 1157  GLUCAP 168*    CBC:  Recent Labs Lab 02/07/17 1520 02/08/17 0654  WBC 22.2* 13.8*  HGB 17.2* 14.3  HCT 54.8* 47.0  MCV 100.0 100.4*  PLT 377 413    Basic Metabolic Panel:  Recent Labs Lab 02/07/17 1520 02/08/17 0654   NA 142 141  K 4.0 4.8  CL 102 102  CO2 25 28  GLUCOSE 192* 191*  BUN 18 20  CREATININE 1.26* 1.12  CALCIUM 9.3 8.9    Recent Results (from the past 240 hour(s))  Blood culture (routine x 2)     Status: None (Preliminary result)   Collection Time: 02/07/17  5:50 PM  Result Value Ref Range Status   Specimen Description BLOOD RIGHT ANTECUBITAL  Final   Special Requests   Final    BOTTLES DRAWN AEROBIC AND ANAEROBIC Blood Culture adequate volume   Culture NO GROWTH 3 DAYS  Final   Report Status PENDING  Incomplete  Blood culture (routine x 2)     Status: None (Preliminary result)   Collection Time: 02/07/17  6:11 PM  Result Value Ref Range Status   Specimen Description BLOOD LEFT HAND  Final   Special Requests IN PEDIATRIC BOTTLE Blood Culture adequate volume  Final   Culture NO GROWTH 3 DAYS  Final   Report Status PENDING  Incomplete  MRSA PCR Screening     Status: None   Collection Time: 02/08/17  4:18 AM  Result Value Ref Range Status   MRSA by PCR NEGATIVE NEGATIVE Final    Comment:        The GeneXpert MRSA Assay (FDA approved for NASAL specimens only), is one component of a comprehensive MRSA colonization surveillance program. It is not intended to diagnose MRSA infection nor to guide or monitor treatment for MRSA infections.      Liver Function Tests: No results for input(s): AST, ALT, ALKPHOS, BILITOT, PROT, ALBUMIN in the last 168 hours. No results for input(s): LIPASE, AMYLASE in the last 168 hours. No results for input(s): AMMONIA in the last 168 hours.  Cardiac Enzymes: No results for input(s): CKTOTAL, CKMB, CKMBINDEX, TROPONINI in the last 168 hours. BNP (last 3 results)  Recent Labs  02/07/17 1645  BNP 295.9*    ProBNP (last 3 results) No results for input(s): PROBNP in the last 8760 hours.    Studies: No results found.  Scheduled Meds: . atorvastatin  40 mg Oral q1800  . benazepril  20 mg Oral Daily  . chlorhexidine  15 mL Mouth Rinse  BID  . cholecalciferol  4,000 Units Oral Daily  . cloNIDine  0.1 mg Oral Daily  . diltiazem  480 mg Oral Daily  . famotidine  10 mg Oral BID  . fluticasone furoate-vilanterol  1 puff Inhalation Daily  . furosemide  20 mg Oral Daily  . mouth rinse  15 mL Mouth Rinse q12n4p  . methylPREDNISolone (SOLU-MEDROL) injection  60 mg Intravenous Q12H  . metoprolol tartrate  12.5 mg  Oral BID  . nicotine  14 mg Transdermal Daily  . rivaroxaban  20 mg Oral Q supper      Time spent: 20 min  [facility] Pager [phone]. If 7PM-7AM, please contact night-coverage at [email or web address], Office  [phone]  password [place]  02/10/2017, 8:33 PM  LOS: 3 days

## 2017-02-10 NOTE — Progress Notes (Addendum)
Progress Note  Patient Name: Antonio Wise Date of Encounter: 02/10/2017  Primary Cardiologist: Dr. Gwenlyn Found  Subjective   76 y.o.malew/ hx HTN, COPD, PAF>>persistent afib, tob use, CHADS2VQASC=2 (HTN, age x 1) on Xarelto, was admitted 08/05 with SOB and afib RVR. Also with COPD flare/bronchitis.  Patient feeling well, asymptomatic. Rates in room 100.  Blood pressure (!) 150/88, pulse (!) 110, temperature 97.6 F (36.4 C), temperature source Oral, resp. rate 16, height 6\' 1"  (1.854 m), weight 192 lb 12.8 oz (87.5 kg), SpO2 92 %.   Inpatient Medications    Scheduled Meds: . atorvastatin  40 mg Oral q1800  . benazepril  20 mg Oral Daily  . chlorhexidine  15 mL Mouth Rinse BID  . cholecalciferol  4,000 Units Oral Daily  . diltiazem  480 mg Oral Daily  . famotidine  10 mg Oral BID  . fluticasone furoate-vilanterol  1 puff Inhalation Daily  . mouth rinse  15 mL Mouth Rinse q12n4p  . methylPREDNISolone (SOLU-MEDROL) injection  60 mg Intravenous Q12H  . nicotine  14 mg Transdermal Daily  . rivaroxaban  20 mg Oral Q supper   Continuous Infusions: . azithromycin Stopped (02/09/17 1922)  . cefTRIAXone (ROCEPHIN)  IV Stopped (02/09/17 1742)   PRN Meds: acetaminophen **OR** acetaminophen, levalbuterol, ondansetron **OR** ondansetron (ZOFRAN) IV, polyethylene glycol   Vital Signs    Vitals:   02/09/17 2107 02/10/17 0010 02/10/17 0355 02/10/17 0731  BP: (!) 139/94 (!) 175/88 (!) 144/92 (!) 150/88  Pulse: (!) 114 (!) 117 (!) 125 (!) 110  Resp: 15 17 19 16   Temp: 98 F (36.7 C) 97.8 F (36.6 C) 97.6 F (36.4 C) 97.6 F (36.4 C)  TempSrc: Oral Oral Oral Oral  SpO2: 98% 95% 100% 92%  Weight:      Height:        Intake/Output Summary (Last 24 hours) at 02/10/17 0759 Last data filed at 02/10/17 0749  Gross per 24 hour  Intake              887 ml  Output             1375 ml  Net             -488 ml   Filed Weights   02/08/17 0602 02/09/17 0431  Weight: 189 lb 9.6 oz  (86 kg) 192 lb 12.8 oz (87.5 kg)    Telemetry    Atrial fibrillation; elevated rates  - Personally Reviewed   Physical Exam   GEN:WD/WN No acute distress.   Neck:supple Cardiac:irregular Respiratory:Diminished BS throughout  JJ:KKXF, nontender, non-distended  MS:No edema Neuro:Nonfocal  Psych: Normal affect   Labs    Chemistry Recent Labs Lab 02/07/17 1520 02/08/17 0654  NA 142 141  K 4.0 4.8  CL 102 102  CO2 25 28  GLUCOSE 192* 191*  BUN 18 20  CREATININE 1.26* 1.12  CALCIUM 9.3 8.9  GFRNONAA 54* >60  GFRAA >60 >60  ANIONGAP 15 11     Hematology Recent Labs Lab 02/07/17 1520 02/08/17 0654  WBC 22.2* 13.8*  RBC 5.48 4.68  HGB 17.2* 14.3  HCT 54.8* 47.0  MCV 100.0 100.4*  MCH 31.4 30.6  MCHC 31.4 30.4  RDW 13.7 14.1  PLT 377 292    Cardiac EnzymesNo results for input(s): TROPONINI in the last 168 hours.  Recent Labs Lab 02/07/17 1656  TROPIPOC 0.00     BNP Recent Labs Lab 02/07/17 1645  BNP 295.9*  DDimer No results for input(s): DDIMER in the last 168 hours.   Radiology    No results found.  Cardiac Studies   Echocardiogram 02/08/2017  Study Conclusions  - Procedure narrative: Transthoracic echocardiography. Very   technically difficult study with reduced echo windows. Definity   contrast given. - Left ventricle: The cavity size was normal. Wall thickness was   increased in a pattern of mild LVH. Systolic function was normal.   The estimated ejection fraction was in the range of 60% to 65%.   Images were inadequate for LV wall motion assessment. The study   is not technically sufficient to allow evaluation of LV diastolic   function. - Right ventricle: Moderately dilated - there appears to be severe   hypokinesis. - Right atrium: The atrium was mildly dilated. - Inferior vena cava: The vessel was dilated. The respirophasic   diameter changes were blunted (< 50%), consistent with elevated   central venous  pressure. - Pericardium, extracardiac: There was no pericardial effusion.  Impressions:  - Very technically difficult study. Definity contrast given. LVEF   is preserved at 60-65%, however, there appears to be significant   RV dysfunction and dilitation. The IVC is dilated and does not   collapse. Consider cardiac MRI to further assess cardiac function   and particularly RV function.  Patient Profile      76 y.o.malew/ hx HTN, COPD, PAF>>persistent afib, tob use, CHADS2VQASC=2 (HTN, age x 1) on Xarelto, was admitted 08/05 with SOB and afib RVR. Also with COPD flare/bronchitis.   Assessment & Plan    1. Atrial fibrillation: Continue Xarelto. Heart rate has been high normal which is attributed to his COPD flare/bronchitis. Yesterday his rates were elevated above this to 120-130s and a small dose of Digoxin was given. This has somewhat improved his rates. Yesterday his Cardizem dose was changed to 480 mg CD daily.  -- Avoid beta blockers -- if patient becomes symptomatic from his atrial fibrillation following tx of COPD/brinchitis then we could potentially attempt rhythm control.   2. COPD/bronchitis: Steroids/abx and bronchodilators per primary team.  3. Hypertension: Blood pressures have been elevated. Plan yesterday was to continue clonidine but this is not on patiens medication list. It appears to have been discontinued by the RN for unknown reasons. Clonidine 0.1 mg PO daily reordered.   4.  Tobacco abuse: Pt plans to discontinue use    Signed, Linus Mako, PA-C  02/10/2017, 7:59 AM    As above, patient seen and examined. Patient's dyspnea continues to improve. His heart rate however remains elevated. I will continue with Cardizem. I have been hesitant to add beta-blockade given his COPD but feel now we have no choice. I will try metoprolol 12.5 mg twice a day and increase dose if he tolerates from a pulmonary standpoint. If not and HR remains high we may need to consider  a different antiarrhythmic such as tikosyn and reattempt cardioversion. Continue xarelto. Patient has mild pedal edema and RV dysfunction. Add Lasix 20 mg daily. Follow potassium and renal function. Patient noted to have nonsustained ventricular tachycardia on telemetry. However LV function is normal. We are trying to add beta-blockade as outlined above which would also help with nonsustained VT. Clonidine reinitiated because of elevated blood pressure. Continue therapy for COPD per primary service. Kirk Ruths, MD

## 2017-02-11 LAB — BASIC METABOLIC PANEL
Anion gap: 11 (ref 5–15)
BUN: 22 mg/dL — ABNORMAL HIGH (ref 6–20)
CHLORIDE: 97 mmol/L — AB (ref 101–111)
CO2: 32 mmol/L (ref 22–32)
CREATININE: 1.15 mg/dL (ref 0.61–1.24)
Calcium: 8.7 mg/dL — ABNORMAL LOW (ref 8.9–10.3)
GFR calc non Af Amer: >60 mL/min (ref >60)
Glucose, Bld: 126 mg/dL — ABNORMAL HIGH (ref 65–99)
Potassium: 4.8 mmol/L (ref 3.5–5.1)
Sodium: 140 mmol/L (ref 135–145)

## 2017-02-11 MED ORDER — FUROSEMIDE 20 MG PO TABS: 20.0000 mg | ORAL_TABLET | Freq: Every day | ORAL | 2 refills | Status: DC

## 2017-02-11 MED ORDER — METOPROLOL TARTRATE 25 MG PO TABS: 25.0000 mg | ORAL_TABLET | Freq: Two times a day (BID) | ORAL | 2 refills | Status: DC

## 2017-02-11 MED ORDER — DILTIAZEM HCL ER COATED BEADS 240 MG PO CP24: 480.0000 mg | ORAL_CAPSULE | Freq: Every day | ORAL | 2 refills | Status: DC

## 2017-02-11 MED ORDER — PREDNISONE 10 MG PO TABS: ORAL_TABLET | ORAL | 0 refills | Status: DC

## 2017-02-11 MED ORDER — GUAIFENESIN ER 600 MG PO TB12
600.0000 mg | ORAL_TABLET | Freq: Two times a day (BID) | ORAL | Status: DC
  Administered 2017-02-11: 600 mg via ORAL
  Filled 2017-02-11: qty 1

## 2017-02-11 MED ORDER — LEVALBUTEROL HCL 0.63 MG/3ML IN NEBU: 0.6300 mg | INHALATION_SOLUTION | Freq: Four times a day (QID) | RESPIRATORY_TRACT | 12 refills | Status: DC | PRN

## 2017-02-11 MED ORDER — BENAZEPRIL HCL 20 MG PO TABS: 20.0000 mg | ORAL_TABLET | Freq: Every day | ORAL | 2 refills | Status: DC

## 2017-02-11 MED ORDER — CLONIDINE HCL 0.1 MG PO TABS: 0.1000 mg | ORAL_TABLET | Freq: Every day | ORAL | 11 refills | Status: DC

## 2017-02-11 MED ORDER — METOPROLOL TARTRATE 25 MG PO TABS
25.0000 mg | ORAL_TABLET | Freq: Two times a day (BID) | ORAL | Status: DC
  Administered 2017-02-11: 25 mg via ORAL
  Filled 2017-02-11: qty 1

## 2017-02-11 NOTE — Discharge Summary (Signed)
Physician Discharge Summary  Antonio Wise:[id number] DOB: [birth year] DOA: 02/07/2017  PCP: [name], PA-C  Admit date: 02/07/2017 Discharge date: 02/11/2017  Time spent: *35* minutes  Recommendations for Outpatient Follow-up:  1.  Follow-up cardiology in 1 week    Discharge Diagnoses:  Principal Problem:   Acute respiratory failure with hypoxia and hypercapnia (HCC) Active Problems:   Hypertension   COPD exacerbation (HCC)   Atrial fibrillation with rapid ventricular response [facility])   Discharge Condition: Stable  Diet recommendation: Heart healthy diet  Filed Weights   02/08/17 0602 02/09/17 0431 02/11/17 0500  Weight: 86 kg (189 lb 9.6 oz) 87.5 kg (192 lb 12.8 oz) 88 kg (193 lb 14.4 oz)    History of present illness:     Hospital Course:  1. Atrial fibrillation with RVR-in the setting of COPD exacerbation. Improved, Patient is currently on Cardizem, cardiology has increased the dose of metoprolol to 25 mg twice a day. Continue anticoagulation with Xarelto. Heart rate is controlled. We discharged home. Patient will follow-up with cardiology in 1 week 2. Acute respiratory failure with hypoxia and hypercapnia- due to COPD exacerbation. Resolved, patient was started on Solu-Medrol, oxygen via nasal cannula. At this time of discharge patient on prednisone taper. He has received antibiotics for 5 days in the hospital. He is afebrile. Chest x-ray showed no acute disease. Will not discharge on antibiotics. 3. Hypertension-blood pressure is controlled, continue diltiazem, metoprolol, clonidine, Lotensin. 4. Leukocytosis- resolved, patient's WBC went down from 22,000 to 13,000.  Procedures: None   Consultations:  Cardiology  Discharge Exam: Vitals:   02/11/17 0949 02/11/17 1145  BP:  126/79  Pulse: (!) 120   Resp: 18   Temp:  97.8 F (36.6 C)  SpO2: 95% 96%    General: Appears in no acute distress Cardiovascular: S1-S2, irregular Respiratory: Clear to  auscultation bilaterally  Discharge Instructions   Discharge Instructions    Diet - low sodium heart healthy    Complete by:  As directed    Increase activity slowly    Complete by:  As directed      Current Discharge Medication List    START taking these medications   Details  !! diltiazem (CARDIZEM CD) 240 MG 24 hr capsule Take 2 capsules (480 mg total) by mouth daily. Qty: 60 capsule, Refills: 2    furosemide (LASIX) 20 MG tablet Take 1 tablet (20 mg total) by mouth daily. Qty: 30 tablet, Refills: 2    levalbuterol (XOPENEX) 0.63 MG/3ML nebulizer solution Take 3 mLs (0.63 mg total) by nebulization every 6 (six) hours as needed for wheezing or shortness of breath. Qty: 3 mL, Refills: 12    metoprolol tartrate (LOPRESSOR) 25 MG tablet Take 1 tablet (25 mg total) by mouth 2 (two) times daily. Qty: 60 tablet, Refills: 2    predniSONE (DELTASONE) 10 MG tablet Prednisone 40 mg po daily x 2 day then Prednisone 30 mg po daily x 2 day then Prednisone 20 mg po daily x 2 day then Prednisone 10 mg daily x 2 day then stop... Qty: 30 tablet, Refills: 0     !! - Potential duplicate medications found. Please discuss with provider.    CONTINUE these medications which have CHANGED   Details  benazepril (LOTENSIN) 20 MG tablet Take 1 tablet (20 mg total) by mouth daily. Qty: 30 tablet, Refills: 2    cloNIDine (CATAPRES) 0.1 MG tablet Take 1 tablet (0.1 mg total) by mouth daily. Qty: 60 tablet, Refills: 11  CONTINUE these medications which have NOT CHANGED   Details  atorvastatin (LIPITOR) 40 MG tablet TAKE 1 TABLET(40 MG) BY MOUTH DAILY AT 6 PM Qty: 90 tablet, Refills: 0    !! CARTIA XT 240 MG 24 hr capsule TAKE 1 CAPSULE(240 MG) BY MOUTH DAILY Qty: 30 capsule, Refills: 0    fluticasone furoate-vilanterol (BREO ELLIPTA) 100-25 MCG/INH AEPB Inhale 1 puff into the lungs daily as needed (shortness of breath).    Multiple Minerals-Vitamins (CALCIUM & VIT D3 BONE HEALTH PO) Take  1 tablet by mouth daily.    XARELTO 20 MG TABS tablet TAKE 1 TABLET(20 MG) BY MOUTH DAILY WITH SUPPER Qty: 30 tablet, Refills: 0   Associated Diagnoses: Essential hypertension; SOB (shortness of breath) on exertion    Cholecalciferol 4000 units CAPS Take 1 capsule (4,000 Units total) by mouth daily. Qty: 30 capsule, Refills: 3     !! - Potential duplicate medications found. Please discuss with provider.     Allergies  Allergen Reactions  . Codeine Nausea And Vomiting    Severe nausea   Follow-up Information    Dena Billet B, PA-C Follow up.   Specialty:  Physician Assistant Contact information: [address] [place] [address] [phone]            The results of significant diagnostics from this hospitalization (including imaging, microbiology, ancillary and laboratory) are listed below for reference.    Significant Diagnostic Studies: Dg Chest Portable 1 View  Result Date: 02/07/2017 CLINICAL DATA:  Shortness of breath for 3 days. EXAM: PORTABLE CHEST 1 VIEW COMPARISON:  CT chest 03/11/2007.  PA and lateral chest 02/29/2008. FINDINGS: The chest is hyperexpanded with attenuation of the pulmonary vasculature. Lungs are clear. Heart size is normal. Aortic atherosclerosis noted. No pneumothorax or pleural effusion. IMPRESSION: No acute disease. Findings compatible with COPD. Atherosclerosis. Electronically Signed   By: Inge Rise M.D.   On: 02/07/2017 17:02    Microbiology: Recent Results (from the past 240 hour(s))  Blood culture (routine x 2)     Status: None (Preliminary result)   Collection Time: 02/07/17  5:50 PM  Result Value Ref Range Status   Specimen Description BLOOD RIGHT ANTECUBITAL  Final   Special Requests   Final    BOTTLES DRAWN AEROBIC AND ANAEROBIC Blood Culture adequate volume   Culture NO GROWTH 3 DAYS  Final   Report Status PENDING  Incomplete  Blood culture (routine x 2)     Status: None (Preliminary result)   Collection Time:  02/07/17  6:11 PM  Result Value Ref Range Status   Specimen Description BLOOD LEFT HAND  Final   Special Requests IN PEDIATRIC BOTTLE Blood Culture adequate volume  Final   Culture NO GROWTH 3 DAYS  Final   Report Status PENDING  Incomplete  MRSA PCR Screening     Status: None   Collection Time: 02/08/17  4:18 AM  Result Value Ref Range Status   MRSA by PCR NEGATIVE NEGATIVE Final    Comment:        The GeneXpert MRSA Assay (FDA approved for NASAL specimens only), is one component of a comprehensive MRSA colonization surveillance program. It is not intended to diagnose MRSA infection nor to guide or monitor treatment for MRSA infections.      Labs: Basic Metabolic Panel:  Recent Labs Lab 02/07/17 1520 02/08/17 0654 02/11/17 0304  NA 142 141 140  K 4.0 4.8 4.8  CL 102 102 97*  CO2 25  28 32  GLUCOSE 192* 191* 126*  BUN 18 20 22*  CREATININE 1.26* 1.12 1.15  CALCIUM 9.3 8.9 8.7*   Liver Function Tests: No results for input(s): AST, ALT, ALKPHOS, BILITOT, PROT, ALBUMIN in the last 168 hours. No results for input(s): LIPASE, AMYLASE in the last 168 hours. No results for input(s): AMMONIA in the last 168 hours. CBC:  Recent Labs Lab 02/07/17 1520 02/08/17 0654  WBC 22.2* 13.8*  HGB 17.2* 14.3  HCT 54.8* 47.0  MCV 100.0 100.4*  PLT 377 292   Cardiac Enzymes: No results for input(s): CKTOTAL, CKMB, CKMBINDEX, TROPONINI in the last 168 hours. BNP: BNP (last 3 results)  Recent Labs  02/07/17 1645  BNP 295.9*    ProBNP (last 3 results) No results for input(s): PROBNP in the last 8760 hours.  CBG:  Recent Labs Lab 02/08/17 1157  GLUCAP 168*       Signed:  Eleonore Chiquito S MD.  [facility] 02/11/2017, 12:14 PM

## 2017-02-11 NOTE — Progress Notes (Signed)
Progress Note  Patient Name: Antonio Wise Date of Encounter: 02/11/2017  Primary Cardiologist: Dr Gwenlyn Found  Subjective   Dyspnea resolved; no chest pain.  Inpatient Medications    Scheduled Meds: . atorvastatin  40 mg Oral q1800  . benazepril  20 mg Oral Daily  . chlorhexidine  15 mL Mouth Rinse BID  . cholecalciferol  4,000 Units Oral Daily  . cloNIDine  0.1 mg Oral Daily  . diltiazem  480 mg Oral Daily  . famotidine  10 mg Oral BID  . fluticasone furoate-vilanterol  1 puff Inhalation Daily  . furosemide  20 mg Oral Daily  . guaiFENesin  600 mg Oral BID  . mouth rinse  15 mL Mouth Rinse q12n4p  . methylPREDNISolone (SOLU-MEDROL) injection  60 mg Intravenous Q12H  . metoprolol tartrate  12.5 mg Oral BID  . nicotine  14 mg Transdermal Daily  . rivaroxaban  20 mg Oral Q supper   Continuous Infusions: . azithromycin Stopped (02/10/17 1941)  . cefTRIAXone (ROCEPHIN)  IV Stopped (02/10/17 1805)   PRN Meds: acetaminophen **OR** acetaminophen, levalbuterol, ondansetron **OR** ondansetron (ZOFRAN) IV, polyethylene glycol   Vital Signs    Vitals:   02/10/17 2215 02/11/17 0100 02/11/17 0500 02/11/17 0900  BP:  (!) 128/91 (!) 142/93 (!) 144/97  Pulse: (!) 117 (!) 102 74   Resp:  (!) 27 14   Temp:  97.8 F (36.6 C) 97.6 F (36.4 C) 97.9 F (36.6 C)  TempSrc:  Oral Oral Oral  SpO2:  95% 94% 96%  Weight:   88 kg (193 lb 14.4 oz)   Height:        Intake/Output Summary (Last 24 hours) at 02/11/17 0944 Last data filed at 02/11/17 0849  Gross per 24 hour  Intake             1002 ml  Output             1650 ml  Net             -648 ml   Filed Weights   02/08/17 0602 02/09/17 0431 02/11/17 0500  Weight: 86 kg (189 lb 9.6 oz) 87.5 kg (192 lb 12.8 oz) 88 kg (193 lb 14.4 oz)    Telemetry    Atrial fibrillation with rate improved but mildly elevated at times- Personally Reviewed   Physical Exam   GEN: No acute distress.   Neck: No JVD Cardiac: irregular and mildly  tachycardic Respiratory: diminished BS throughout GI: Soft, nontender, non-distended  MS: No edema Neuro:  Nonfocal  Psych: Normal affect   Labs    Chemistry Recent Labs Lab 02/07/17 1520 02/08/17 0654 02/11/17 0304  NA 142 141 140  K 4.0 4.8 4.8  CL 102 102 97*  CO2 25 28 32  GLUCOSE 192* 191* 126*  BUN 18 20 22*  CREATININE 1.26* 1.12 1.15  CALCIUM 9.3 8.9 8.7*  GFRNONAA 54* >60 >60  GFRAA >60 >60 >60  ANIONGAP 15 11 11      Hematology Recent Labs Lab 02/07/17 1520 02/08/17 0654  WBC 22.2* 13.8*  RBC 5.48 4.68  HGB 17.2* 14.3  HCT 54.8* 47.0  MCV 100.0 100.4*  MCH 31.4 30.6  MCHC 31.4 30.4  RDW 13.7 14.1  PLT 377 292      Recent Labs Lab 02/07/17 1656  TROPIPOC 0.00     BNP Recent Labs Lab 02/07/17 1645  BNP 295.9*     Patient Profile      76 y.o.malew/  hx HTN, COPD, PAF>>persistent afib, tob use, CHADS2VQASC=2 (HTN, age x 1) on Xarelto, was admitted 08/05 with SOB and afib RVR. Also with COPD flare/bronchitis.  Assessment & Plan    1 Persistent atrial fibrillation-plan previously was rate control and anticoagulation. His heart rate is improving. Continue present dose of Cardizem. He tolerated metoprolol low dose and I will advance to 25 mg twice a day which should control his heart rate. Continue xarelto. If patient has symptoms in the future felt secondary to atrial fibrillation or his rate was difficult to control it could consider rhythm control with addition of antiarrhythmic such as tikosyn.  2 hypertension-blood pressure elevated. Increase metoprolol to 25 mg twice a day and follow.  3 tobacco abuse-patient counseled on discontinuing.  4 COPD/bronchitis-management per primary care.   5 probable right heart failure secondary to COPD-volume status has improved. Continue Lasix 20 mg daily. Would check potassium and renal function one week following discharge.   Patient can be discharged from a cardiac standpoint. He should follow-up  with one of our Apps in one week for transition of care appointment. Follow-up Dr. Gwenlyn Found approximately 8-12 weeks.  Signed, Kirk Ruths, MD  02/11/2017, 9:44 AM

## 2017-02-11 NOTE — Care Management Important Message (Signed)
Important Message  Patient Details  Name: Antonio Wise MRN: [id number] Date of Birth: 08-Jul-1940   Medicare Important Message Given:  Yes    Nathen May 02/11/2017, 9:46 AM

## 2017-02-11 NOTE — Discharge Instructions (Signed)
Acute Respiratory Failure, Adult °Acute respiratory failure occurs when there is not enough oxygen passing from your lungs to your body. When this happens, your lungs have trouble removing carbon dioxide from the blood. This causes your blood oxygen level to drop too low as carbon dioxide builds up. °Acute respiratory failure is a medical emergency. It can develop quickly, but it is temporary if treated promptly. Your lung capacity, or how much air your lungs can hold, may improve with time, exercise, and treatment. °What are the causes? °There are many possible causes of acute respiratory failure, including: °· Lung injury. °· Chest injury or damage to the ribs or tissues near the lungs. °· Lung conditions that affect the flow of air and blood into and out of the lungs, such as pneumonia, acute respiratory distress syndrome, and cystic fibrosis. °· Medical conditions, such as strokes or spinal cord injuries, that affect the muscles and nerves that control breathing. °· Blood infection (sepsis). °· Inflammation of the pancreas (pancreatitis). °· A blood clot in the lungs (pulmonary embolism). °· A large-volume blood transfusion. °· Burns. °· Near-drowning. °· Seizure. °· Smoke inhalation. °· Reaction to medicines. °· Alcohol or drug overdose. ° °What increases the risk? °This condition is more likely to develop in people who have: °· A blocked airway. °· Asthma. °· A condition or disease that damages or weakens the muscles, nerves, bones, or tissues that are involved in breathing. °· A serious infection. °· A health problem that blocks the unconscious reflex that is involved in breathing, such as hypothyroidism or sleep apnea. °· A lung injury or trauma. ° °What are the signs or symptoms? °Trouble breathing is the main symptom of acute respiratory failure. Symptoms may also include: °· Rapid breathing. °· Restlessness or anxiety. °· Skin, lips, or fingernails that appear blue (cyanosis). °· Rapid heart  rate. °· Abnormal heart rhythms (arrhythmias). °· Confusion or changes in behavior. °· Tiredness or loss of energy. °· Feeling sleepy or having a loss of consciousness. ° °How is this diagnosed? °Your health care provider can diagnose acute respiratory failure with a medical history and physical exam. During the exam, your health care provider will listen to your heart and check for crackling or wheezing sounds in your lungs. Your may also have tests to confirm the diagnosis and determine what is causing respiratory failure. These tests may include: °· Measuring the amount of oxygen in your blood (pulse oximetry). The measurement comes from a small device that is placed on your finger, earlobe, or toe. °· Other blood tests to measure blood gases and to look for signs of infection. °· Sampling your cerebral spinal fluid or tracheal fluid to check for infections. °· Chest X-ray to look for fluid in spaces that should be filled with air. °· Electrocardiogram (ECG) to look at the heart's electrical activity. ° °How is this treated? °Treatment for this condition usually takes places in a hospital intensive care unit (ICU). Treatment depends on what is causing the condition. It may include one or more treatments until your symptoms improve. Treatment may include: °· Supplemental oxygen. Extra oxygen is given through a tube in the nose, a face mask, or a hood. °· A device such as a continuous positive airway pressure (CPAP) or bi-level positive airway pressure (BiPAP or BPAP) machine. This treatment uses mild air pressure to keep the airways open. A mask or other device will be placed over your nose or mouth. A tube that is connected to a motor will deliver   oxygen through the mask.  Ventilator. This treatment helps move air into and out of the lungs. This may be done with a bag and mask or a machine. For this treatment, a tube is placed in your windpipe (trachea) so air and oxygen can flow to the lungs.  Extracorporeal  membrane oxygenation (ECMO). This treatment temporarily takes over the function of the heart and lungs, supplying oxygen and removing carbon dioxide. ECMO gives the lungs a chance to recover. It may be used if a ventilator is not effective.  Tracheostomy. This is a procedure that creates a hole in the neck to insert a breathing tube.  Receiving fluids and medicines.  Rocking the bed to help breathing.  Follow these instructions at home:  Take over-the-counter and prescription medicines only as told by your health care provider.  Return to normal activities as told by your health care provider. Ask your health care provider what activities are safe for you.  Keep all follow-up visits as told by your health care provider. This is important. How is this prevented? Treating infections and medical conditions that may lead to acute respiratory failure can help prevent the condition from developing. Contact a health care provider if:  You have a fever.  Your symptoms do not improve or they get worse. Get help right away if:  You are having trouble breathing.  You lose consciousness.  Your have cyanosis or turn blue.  You develop a rapid heart rate.  You are confused. These symptoms may represent a serious problem that is an emergency. Do not wait to see if the symptoms will go away. Get medical help right away. Call your local emergency services (911 in the [place] Do not drive yourself to the hospital. This information is not intended to replace advice given to you by your health care provider. Make sure you discuss any questions you have with your health care provider. Document Released: 06/27/2013 Document Revised: 01/18/2016 Document Reviewed: 01/08/2016 Elsevier Interactive Patient Education  2018 [employer].  Atrial Fibrillation Atrial fibrillation is a type of irregular or rapid heartbeat (arrhythmia). In atrial fibrillation, the heart quivers continuously in a chaotic  pattern. This occurs when parts of the heart receive disorganized signals that make the heart unable to pump blood normally. This can increase the risk for stroke, heart failure, and other heart-related conditions. There are different types of atrial fibrillation, including:  Paroxysmal atrial fibrillation. This type starts suddenly, and it usually stops on its own shortly after it starts.  Persistent atrial fibrillation. This type often lasts longer than a week. It may stop on its own or with treatment.  Long-lasting persistent atrial fibrillation. This type lasts longer than 12 months.  Permanent atrial fibrillation. This type does not go away.  Talk with your health care provider to learn about the type of atrial fibrillation that you have. What are the causes? This condition is caused by some heart-related conditions or procedures, including:  A heart attack.  Coronary artery disease.  Heart failure.  Heart valve conditions.  High blood pressure.  Inflammation of the sac that surrounds the heart (pericarditis).  Heart surgery.  Certain heart rhythm disorders, such as Wolf-Parkinson-White syndrome.  Other causes include:  Pneumonia.  Obstructive sleep apnea.  Blockage of an artery in the lungs (pulmonary embolism, or PE).  Lung cancer.  Chronic lung disease.  Thyroid problems, especially if the thyroid is overactive (hyperthyroidism).  Caffeine.  Excessive alcohol use or illegal drug use.  Use of some  medicines, including certain decongestants and diet pills.  Sometimes, the cause cannot be found. What increases the risk? This condition is more likely to develop in:  People who are older in age.  People who smoke.  People who have diabetes mellitus.  People who are overweight (obese).  Athletes who exercise vigorously.  What are the signs or symptoms? Symptoms of this condition include:  A feeling that your heart is beating rapidly or  irregularly.  A feeling of discomfort or pain in your chest.  Shortness of breath.  Sudden light-headedness or weakness.  Getting tired easily during exercise.  In some cases, there are no symptoms. How is this diagnosed? Your health care provider may be able to detect atrial fibrillation when taking your pulse. If detected, this condition may be diagnosed with:  An electrocardiogram (ECG).  A Holter monitor test that records your heartbeat patterns over a 24-hour period.  Transthoracic echocardiogram (TTE) to evaluate how blood flows through your heart.  Transesophageal echocardiogram (TEE) to view more detailed images of your heart.  A stress test.  Imaging tests, such as a CT scan or chest X-ray.  Blood tests.  How is this treated? The main goals of treatment are to prevent blood clots from forming and to keep your heart beating at a normal rate and rhythm. The type of treatment that you receive depends on many factors, such as your underlying medical conditions and how you feel when you are experiencing atrial fibrillation. This condition may be treated with:  Medicine to slow down the heart rate, bring the hearts rhythm back to normal, or prevent clots from forming.  Electrical cardioversion. This is a procedure that resets your hearts rhythm by delivering a controlled, low-energy shock to the heart through your skin.  Different types of ablation, such as catheter ablation, catheter ablation with pacemaker, or surgical ablation. These procedures destroy the heart tissues that send abnormal signals. When the pacemaker is used, it is placed under your skin to help your heart beat in a regular rhythm.  Follow these instructions at home:  Take over-the counter and prescription medicines only as told by your health care provider.  If your health care provider prescribed a blood-thinning medicine (anticoagulant), take it exactly as told. Taking too much blood-thinning  medicine can cause bleeding. If you do not take enough blood-thinning medicine, you will not have the protection that you need against stroke and other problems.  Do not use tobacco products, including cigarettes, chewing tobacco, and e-cigarettes. If you need help quitting, ask your health care provider.  If you have obstructive sleep apnea, manage your condition as told by your health care provider.  Do not drink alcohol.  Do not drink beverages that contain caffeine, such as coffee, soda, and tea.  Maintain a healthy weight. Do not use diet pills unless your health care provider approves. Diet pills may make heart problems worse.  Follow diet instructions as told by your health care provider.  Exercise regularly as told by your health care provider.  Keep all follow-up visits as told by your health care provider. This is important. How is this prevented?  Avoid drinking beverages that contain caffeine or alcohol.  Avoid certain medicines, especially medicines that are used for breathing problems.  Avoid certain herbs and herbal medicines, such as those that contain ephedra or ginseng.  Do not use illegal drugs, such as cocaine and amphetamines.  Do not smoke.  Manage your high blood pressure. Contact a health care  provider if:  You notice a change in the rate, rhythm, or strength of your heartbeat.  You are taking an anticoagulant and you notice increased bruising.  You tire more easily when you exercise or exert yourself. Get help right away if:  You have chest pain, abdominal pain, sweating, or weakness.  You feel nauseous.  You notice blood in your vomit, bowel movement, or urine.  You have shortness of breath.  You suddenly have swollen feet and ankles.  You feel dizzy.  You have sudden weakness or numbness of the face, arm, or leg, especially on one side of the body.  You have trouble speaking, trouble understanding, or both (aphasia).  Your face or your  eyelid droops on one side. These symptoms may represent a serious problem that is an emergency. Do not wait to see if the symptoms will go away. Get medical help right away. Call your local emergency services (911 in the [place] Do not drive yourself to the hospital. This information is not intended to replace advice given to you by your health care provider. Make sure you discuss any questions you have with your health care provider. Document Released: 06/22/2005 Document Revised: 10/30/2015 Document Reviewed: 10/17/2014 Elsevier Interactive Patient Education  2017 [employer]  Chronic Obstructive Pulmonary Disease Chronic obstructive pulmonary disease (COPD) is a common lung condition in which airflow from the lungs is limited. COPD is a general term that can be used to describe many different lung problems that limit airflow, including both chronic bronchitis and emphysema. If you have COPD, your lung function will probably never return to normal, but there are measures you can take to improve lung function and make yourself feel better. What are the causes?  Smoking (common).  Exposure to secondhand smoke.  Genetic problems.  Chronic inflammatory lung diseases or recurrent infections. What are the signs or symptoms?  Shortness of breath, especially with physical activity.  Deep, persistent (chronic) cough with a large amount of thick mucus.  Wheezing.  Rapid breaths (tachypnea).  Gray or bluish discoloration (cyanosis) of the skin, especially in your fingers, toes, or lips.  Fatigue.  Weight loss.  Frequent infections or episodes when breathing symptoms become much worse (exacerbations).  Chest tightness. How is this diagnosed? Your health care provider will take a medical history and perform a physical examination to diagnose COPD. Additional tests for COPD may include:  Lung (pulmonary) function tests.  Chest X-ray.  CT scan.  Blood tests.  How is this  treated? Treatment for COPD may include:  Inhaler and nebulizer medicines. These help manage the symptoms of COPD and make your breathing more comfortable.  Supplemental oxygen. Supplemental oxygen is only helpful if you have a low oxygen level in your blood.  Exercise and physical activity. These are beneficial for nearly all people with COPD.  Lung surgery or transplant.  Nutrition therapy to gain weight, if you are underweight.  Pulmonary rehabilitation. This may involve working with a team of health care providers and specialists, such as respiratory, occupational, and physical therapists.  Follow these instructions at home:  Take all medicines (inhaled or pills) as directed by your health care provider.  Avoid over-the-counter medicines or cough syrups that dry up your airway (such as antihistamines) and slow down the elimination of secretions unless instructed otherwise by your health care provider.  If you are a smoker, the most important thing that you can do is stop smoking. Continuing to smoke will cause further lung damage and breathing  trouble. Ask your health care provider for help with quitting smoking. He or she can direct you to community resources or hospitals that provide support.  Avoid exposure to irritants such as smoke, chemicals, and fumes that aggravate your breathing.  Use oxygen therapy and pulmonary rehabilitation if directed by your health care provider. If you require home oxygen therapy, ask your health care provider whether you should purchase a pulse oximeter to measure your oxygen level at home.  Avoid contact with individuals who have a contagious illness.  Avoid extreme temperature and humidity changes.  Eat healthy foods. Eating smaller, more frequent meals and resting before meals may help you maintain your strength.  Stay active, but balance activity with periods of rest. Exercise and physical activity will help you maintain your ability to do  things you want to do.  Preventing infection and hospitalization is very important when you have COPD. Make sure to receive all the vaccines your health care provider recommends, especially the pneumococcal and influenza vaccines. Ask your health care provider whether you need a pneumonia vaccine.  Learn and use relaxation techniques to manage stress.  Learn and use controlled breathing techniques as directed by your health care provider. Controlled breathing techniques include: 1. Pursed lip breathing. Start by breathing in (inhaling) through your nose for 1 second. Then, purse your lips as if you were going to whistle and breathe out (exhale) through the pursed lips for 2 seconds. 2. Diaphragmatic breathing. Start by putting one hand on your abdomen just above your waist. Inhale slowly through your nose. The hand on your abdomen should move out. Then purse your lips and exhale slowly. You should be able to feel the hand on your abdomen moving in as you exhale.  Learn and use controlled coughing to clear mucus from your lungs. Controlled coughing is a series of short, progressive coughs. The steps of controlled coughing are: 1. Lean your head slightly forward. 2. Breathe in deeply using diaphragmatic breathing. 3. Try to hold your breath for 3 seconds. 4. Keep your mouth slightly open while coughing twice. 5. Spit any mucus out into a tissue. 6. Rest and repeat the steps once or twice as needed. Contact a health care provider if:  You are coughing up more mucus than usual.  There is a change in the color or thickness of your mucus.  Your breathing is more labored than usual.  Your breathing is faster than usual. Get help right away if:  You have shortness of breath while you are resting.  You have shortness of breath that prevents you from: ? Being able to talk. ? Performing your usual physical activities.  You have chest pain lasting longer than 5 minutes.  Your skin color is  more cyanotic than usual.  You measure low oxygen saturations for longer than 5 minutes with a pulse oximeter. This information is not intended to replace advice given to you by your health care provider. Make sure you discuss any questions you have with your health care provider. Document Released: 04/01/2005 Document Revised: 11/28/2015 Document Reviewed: 02/16/2013 Elsevier Interactive Patient Education  2017 [employer].

## 2017-02-11 NOTE — Progress Notes (Signed)
Physical Therapy Treatment Patient Details Name: Antonio Wise MRN: [id number] DOB: 1941/01/15 Today's Date: 02/11/2017    History of Present Illness Pt adm with copd exacerbation and afib with rvr and copd exacerbation. PMH - HTN, copd,     PT Comments    Pt doing well with mobility and no further PT needed.  Ready for dc from PT standpoint. Pt able to amb on RA with SpO2 > 90%. No supplemental oxygen required.   Follow Up Recommendations  No PT follow up;Supervision - Intermittent     Equipment Recommendations  None recommended by PT    Recommendations for Other Services       Precautions / Restrictions Precautions Precautions: Other (comment) Precaution Comments: watch HR and O2 Restrictions Weight Bearing Restrictions: No    Mobility  Bed Mobility Overal bed mobility: Modified Independent                Transfers Overall transfer level: Modified independent Equipment used: None Transfers: Sit to/from Stand Sit to Stand: Modified independent (Device/Increase time)            Ambulation/Gait Ambulation/Gait assistance: Modified independent (Device/Increase time) Ambulation Distance (Feet): 250 Feet Assistive device: None Gait Pattern/deviations: Step-through pattern;Decreased stride length     General Gait Details: Steady gait. HR to max of 114. Pt amb on RA with SpO2 >90%.    Stairs            Wheelchair Mobility    Modified Rankin (Stroke Patients Only)       Balance Overall balance assessment: Needs assistance Sitting-balance support: No upper extremity supported;Feet supported Sitting balance-Leahy Scale: Normal     Standing balance support: No upper extremity supported Standing balance-Leahy Scale: Good                              Cognition Arousal/Alertness: Awake/alert Behavior During Therapy: WFL for tasks assessed/performed Overall Cognitive Status: Within Functional Limits for tasks assessed                                        Exercises      General Comments        Pertinent Vitals/Pain Pain Assessment: No/denies pain    Home Living                      Prior Function            PT Goals (current goals can now be found in the care plan section) Progress towards PT goals: Goals met/education completed, patient discharged from PT    Frequency    Min 3X/week      PT Plan Current plan remains appropriate    Co-evaluation              AM-PAC PT "6 Clicks" Daily Activity  Outcome Measure  Difficulty turning over in bed (including adjusting bedclothes, sheets and blankets)?: None Difficulty moving from lying on back to sitting on the side of the bed? : None Difficulty sitting down on and standing up from a chair with arms (e.g., wheelchair, bedside commode, etc,.)?: None Help needed moving to and from a bed to chair (including a wheelchair)?: None Help needed walking in hospital room?: None Help needed climbing 3-5 steps with a railing? : A Little 6 Click Score: 23    End  of Session   Activity Tolerance: Patient tolerated treatment well Patient left: in bed;with call bell/phone within reach;with family/visitor present (sitting eob) Nurse Communication: Mobility status;Other (comment) (vitals) PT Visit Diagnosis: Unsteadiness on feet (R26.81);Muscle weakness (generalized) (M62.81)     Time: 1225-8346 PT Time Calculation (min) (ACUTE ONLY): 8 min  Charges:  $Gait Training: 8-22 mins                    G Codes:       [facility] [place] 02/11/2017, 1:38 PM

## 2017-02-11 NOTE — Care Management Note (Signed)
Case Management Note  Patient Details  Name: Antonio Wise MRN: [id number] Date of Birth: 20-Mar-1941  Subjective/Objective:  From home alone, per pt/ot eval no pt/ot f/u needed.  NCM received call that patient needs neb machine,  NCM spoke with patient and he would like to use [facility] for neb machine , referral given to [facility] with [facility] ,they will bring up to patient's room. No other needs.                 Action/Plan:   Expected Discharge Date:  02/11/17               Expected Discharge Plan:  Home/Self Care  In-House Referral:     Discharge planning Services  CM Consult  Post Acute Care Choice:  Durable Medical Equipment Choice offered to:  Patient  DME Arranged:  Nebulizer machine DME Agency:  [place]:    [facility] Agency:     Status of Service:  Completed, signed off  If discussed at [place] of Stay Meetings, dates discussed:    Additional Comments:  Zenon Mayo, RN 02/11/2017, 2:28 PM

## 2017-02-12 LAB — CULTURE, BLOOD (ROUTINE X 2)
CULTURE: NO GROWTH
Culture: NO GROWTH
SPECIAL REQUESTS: ADEQUATE
SPECIAL REQUESTS: ADEQUATE

## 2017-02-15 ENCOUNTER — Encounter: Payer: Self-pay | Admitting: Physician Assistant

## 2017-02-15 ENCOUNTER — Ambulatory Visit (INDEPENDENT_AMBULATORY_CARE_PROVIDER_SITE_OTHER): Payer: PPO | Admitting: Physician Assistant

## 2017-02-15 VITALS — BP 160/70 | HR 122 | Temp 97.3°F | Resp 20 | Wt 204.4 lb

## 2017-02-15 DIAGNOSIS — J9601 Acute respiratory failure with hypoxia: Secondary | ICD-10-CM | POA: Diagnosis not present

## 2017-02-15 DIAGNOSIS — Z09 Encounter for follow-up examination after completed treatment for conditions other than malignant neoplasm: Secondary | ICD-10-CM | POA: Diagnosis not present

## 2017-02-15 DIAGNOSIS — J441 Chronic obstructive pulmonary disease with (acute) exacerbation: Secondary | ICD-10-CM | POA: Diagnosis not present

## 2017-02-15 DIAGNOSIS — J9602 Acute respiratory failure with hypercapnia: Secondary | ICD-10-CM

## 2017-02-15 DIAGNOSIS — I4891 Unspecified atrial fibrillation: Secondary | ICD-10-CM | POA: Diagnosis not present

## 2017-02-15 NOTE — Progress Notes (Signed)
Patient ID: KOHL POLINSKY MRN: [id number], DOB: 06/16/1941, 76 y.o. Date of Encounter: @DATE @  Chief Complaint:  Chief Complaint  Patient presents with  . Hospitalization Follow-up    HPI: 76 y.o. year old male  presents for [place] after recent hospitalization.  I have reviewed his hospital discharge summary. He was hospitalized 02/07/2017 through 02/11/2017.  Discharge diagnoses were: Acute respiratory failure with hypoxia and hypercapnia COPD exacerbation Atrial fibrillation with rapid ventricular response  The following is copied from hospital discharge summary portion "hospital course ":  "Hospital Course:  1. Atrial fibrillation with RVR-in the setting of COPD exacerbation. Improved, Patient is currently on Cardizem, cardiology has increased the dose of metoprolol to 25 mg twice a day. Continue anticoagulation with Xarelto. Heart rate is controlled. We discharged home. Patient will follow-up with cardiology in 1 week 2. Acute respiratory failure with hypoxia and hypercapnia- due to COPD exacerbation. Resolved, patient was started on Solu-Medrol,oxygen via nasal cannula. At this time of discharge patient on prednisone taper. He has received antibiotics for 5 days in the hospital. He is afebrile. Chest x-ray showed no acute disease. Will not discharge on antibiotics. 3. Hypertension-blood pressure is controlled, continue diltiazem, metoprolol, clonidine, Lotensin. 4. Leukocytosis-resolved, patient's WBC went down from 22,000 to 13,000.   "   Today patient states that he has taken the prednisone as directed. He states that he is taking all medicines as directed. He states that since time of discharge home he has continued to feel better and feels much better now compared to even when he was discharged home from the hospital. Reports that he does continue to have some occasional cough but it is not as bad as it was. States that he has not smoked in 8 days. Requesting samples of  any medications that I can provide samples of. Has no other specific concerns to address today.      Past Medical History:  Diagnosis Date  . Colon polyp 05/06/2008   repeat 5 years  . COPD (chronic obstructive pulmonary disease) ([place])   . Duodenal ulcer 05/06/2008   EGD  . Elevated PSA   . Gastric ulcer 05/06/2008   EGD  . H/O Helicobacter infection 05/06/2008   EGD  . History of nuclear stress test 02/19/2010   bruce protocol; mild-mod perfusion defect in spetal region consistent with attenuation artifact; no significant ischemia demonstrated; dysrhythmia during exercise  . Hyperlipidemia   . Hypertension   . Paroxysmal atrial fibrillation (HCC)   . Paroxysmal atrial fibrillation (HCC)   . Peripheral vascular disease ([place])   . Smoker unmotivated to quit   . Vitamin D deficiency      Home Meds: Outpatient Medications Prior to Visit  Medication Sig Dispense Refill  . atorvastatin (LIPITOR) 40 MG tablet TAKE 1 TABLET(40 MG) BY MOUTH DAILY AT 6 PM 90 tablet 0  . benazepril (LOTENSIN) 20 MG tablet Take 1 tablet (20 mg total) by mouth daily. 30 tablet 2  . Cholecalciferol 4000 units CAPS Take 1 capsule (4,000 Units total) by mouth daily. 30 capsule 3  . cloNIDine (CATAPRES) 0.1 MG tablet Take 1 tablet (0.1 mg total) by mouth daily. 60 tablet 11  . diltiazem (CARDIZEM CD) 240 MG 24 hr capsule Take 2 capsules (480 mg total) by mouth daily. 60 capsule 2  . fluticasone furoate-vilanterol (BREO ELLIPTA) 100-25 MCG/INH AEPB Inhale 1 puff into the lungs daily as needed (shortness of breath).    . furosemide (LASIX) 20 MG tablet Take  1 tablet (20 mg total) by mouth daily. 30 tablet 2  . levalbuterol (XOPENEX) 0.63 MG/3ML nebulizer solution Take 3 mLs (0.63 mg total) by nebulization every 6 (six) hours as needed for wheezing or shortness of breath. 3 mL 12  . metoprolol tartrate (LOPRESSOR) 25 MG tablet Take 1 tablet (25 mg total) by mouth 2 (two) times daily. 60 tablet 2  . Multiple  Minerals-Vitamins (CALCIUM & VIT D3 BONE HEALTH PO) Take 1 tablet by mouth daily.    Alveda Reasons 20 MG TABS tablet TAKE 1 TABLET(20 MG) BY MOUTH DAILY WITH SUPPER 30 tablet 0  . predniSONE (DELTASONE) 10 MG tablet Prednisone 40 mg po daily x 2 day then Prednisone 30 mg po daily x 2 day then Prednisone 20 mg po daily x 2 day then Prednisone 10 mg daily x 2 day then stop... 30 tablet 0   No facility-administered medications prior to visit.     Allergies:  Allergies  Allergen Reactions  . Codeine Nausea And Vomiting    Severe nausea    Social History   Social History  . Marital status: Married    Spouse name: N/A  . Number of children: 3  . Years of education: N/A   Occupational History  .  [employer]   Social History Main Topics  . Smoking status: Current Some Day Smoker    Packs/day: 0.20    Types: Cigarettes    Last attempt to quit: 05/17/2013  . Smokeless tobacco: Never Used  . Alcohol use No  . Drug use: No  . Sexual activity: Not on file   Other Topics Concern  . Not on file   Social History Narrative   Entered 06/2014:   His wife was also a patient of mine ([facility])   She passed away in 2015--secondary to ongoing smoker, lung cancer.    At [place] 06/2104 pt reports that one of their kids was already living with them--so he is not alone, even now .    Family History  Problem Relation Age of Onset  . Heart disease Mother   . COPD Father   . Cancer Father   . Cancer Sister 80       Breast Cancer  . Diabetes Brother      Review of Systems:  See HPI for pertinent ROS. All other ROS negative.    Physical Exam: Blood pressure (!) 160/70, pulse (!) 122, temperature (!) 97.3 F (36.3 C), temperature source Oral, resp. rate 20, weight 204 lb 6.4 oz (92.7 kg), SpO2 96 %., Body mass index is 26.97 kg/m. General: WNWD WM. Appears in no acute distress. Head: Normocephalic, atraumatic, eyes without discharge, sclera non-icteric, nares are without discharge.  Bilateral auditory canals clear, TM's are without perforation, pearly grey and translucent with reflective cone of light bilaterally. Oral cavity moist, posterior pharynx without exudate, erythema, peritonsillar abscess, or post nasal drip.  Neck: Supple. No thyromegaly. No lymphadenopathy. Lungs: Distant, Decreased Breath sounds but no wheezes.  Heart: Irregular rhythm.  No murmurs, rubs, or gallops. Abdomen: Soft, non-tender, non-distended with normoactive bowel sounds. No hepatomegaly. No rebound/guarding. No obvious abdominal masses. Musculoskeletal:  Strength and tone normal for age. Extremities/Skin: Trace LE edema bilaterally. Neuro: Alert and oriented X 3. Moves all extremities spontaneously. Gait is normal. CNII-XII grossly in tact. Psych:  Responds to questions appropriately with a normal affect.     ASSESSMENT AND PLAN:  76 y.o. year old male with  1. Hospital discharge follow-up  2. COPD  exacerbation ([place])  3. Acute respiratory failure with hypoxia and hypercapnia (HCC)  4. Atrial fibrillation with rapid ventricular response (HCC)  I provided him with 2 samples of Breo and 4 weeks of Xarelto samples.  These are the only samples we have for any of the medicines he is on. He has not yet seen cardiology that his follow-up appointment at cardiology is August 27. Noted that his heart rate and blood pressure reading high today but he is on very high dose of Cardizem and is on beta blocker despite his COPD.  Will make no changes in his medications at this time. Told him to follow-up if symptoms worsen.   Marin Olp [place], [place], [facility] 02/15/2017 4:42 PM

## 2017-02-28 NOTE — Progress Notes (Signed)
Cardiology Office Note    Date:  03/01/2017   ID:  Antonio Wise, DOB 21-Sep-1940, MRN [id number]  PCP:  [name], PA-C  Cardiologist: Dr. Gwenlyn Found   Chief Complaint  Patient presents with  . Hospitalization Follow-up    no current symptoms    History of Present Illness:    Antonio Wise is a 76 y.o. male with past medical history persistent atrial fibrillation (on Xarelto), PAD (s/p LCIA stenting in 2006), COPD HTN, HLD, and tobacco use who presents to the office today for hospital follow-up.  He was recently admitted from 8/5 - 02/11/2017 for worsening dyspnea on exertion and fatigue, found to have a COPD exacerbation and to be in atrial fibrillation with RVR. He was initially started on IV Cardizem with this being switched to PO. He did require the addition of cardioselective BB therapy with Lopressor 25mg  BID being added to his regimen.   In talking with the patient today, he reports doing well since his recent hospitalization. He denies any recurrent chest discomfort or palpitations. Does have baseline dyspnea on exertion but denies any acute worsening of this.  He reports good compliance with his medication regimen and denies any evidence of active bleeding while on Xarelto. He does not check his blood pressure regularly but it is well-controlled at 122/74 during today's visit.  He has reduced his tobacco use to less than 1 pack per week but is still doubtful that he can fully stop smoking.    Past Medical History:  Diagnosis Date  . Colon polyp 05/06/2008   repeat 5 years  . COPD (chronic obstructive pulmonary disease) ([place])   . Duodenal ulcer 05/06/2008   EGD  . Elevated PSA   . Gastric ulcer 05/06/2008   EGD  . H/O Helicobacter infection 05/06/2008   EGD  . History of nuclear stress test 02/19/2010   bruce protocol; mild-mod perfusion defect in spetal region consistent with attenuation artifact; no significant ischemia demonstrated; dysrhythmia during exercise  .  Hyperlipidemia   . Hypertension   . Peripheral vascular disease ([place])    a. s/p left common iliac artery stenting in 2006  . Persistent atrial fibrillation ([place])    a. on Xarelto  . Smoker unmotivated to quit   . Vitamin D deficiency     Past Surgical History:  Procedure Laterality Date  . ILIAC VEIN ANGIOPLASTY / STENTING  05/19/2005   left CIA PTA & stenting   . TRANSTHORACIC ECHOCARDIOGRAM  06/10/2005   LV hyperdynamic; borderline LA enlargement; trace MR/TR/AVR    Current Medications: Outpatient Medications Prior to Visit  Medication Sig Dispense Refill  . atorvastatin (LIPITOR) 40 MG tablet TAKE 1 TABLET(40 MG) BY MOUTH DAILY AT 6 PM 90 tablet 0  . benazepril (LOTENSIN) 20 MG tablet Take 1 tablet (20 mg total) by mouth daily. 30 tablet 2  . benazepril (LOTENSIN) 40 MG tablet Take 1 tablet by mouth daily.  0  . Cholecalciferol 4000 units CAPS Take 1 capsule (4,000 Units total) by mouth daily. 30 capsule 3  . diltiazem (CARDIZEM CD) 240 MG 24 hr capsule Take 2 capsules (480 mg total) by mouth daily. 60 capsule 2  . fluticasone furoate-vilanterol (BREO ELLIPTA) 100-25 MCG/INH AEPB Inhale 1 puff into the lungs daily as needed (shortness of breath).    . furosemide (LASIX) 20 MG tablet Take 1 tablet (20 mg total) by mouth daily. 30 tablet 2  . levalbuterol (XOPENEX) 0.63 MG/3ML nebulizer solution Take 3 mLs (0.63  mg total) by nebulization every 6 (six) hours as needed for wheezing or shortness of breath. 3 mL 12  . Multiple Minerals-Vitamins (CALCIUM & VIT D3 BONE HEALTH PO) Take 1 tablet by mouth daily.    Alveda Reasons 20 MG TABS tablet TAKE 1 TABLET(20 MG) BY MOUTH DAILY WITH SUPPER 30 tablet 0  . cloNIDine (CATAPRES) 0.1 MG tablet Take 1 tablet (0.1 mg total) by mouth daily. 60 tablet 11  . metoprolol tartrate (LOPRESSOR) 25 MG tablet Take 1 tablet (25 mg total) by mouth 2 (two) times daily. 60 tablet 2   No facility-administered medications prior to visit.      Allergies:    Codeine   Social History   Social History  . Marital status: Married    Spouse name: N/A  . Number of children: 3  . Years of education: N/A   Occupational History  .  [employer]   Social History Main Topics  . Smoking status: Current Some Day Smoker    Packs/day: 0.20    Types: Cigarettes    Last attempt to quit: 05/17/2013  . Smokeless tobacco: Never Used  . Alcohol use No  . Drug use: No  . Sexual activity: Not Asked   Other Topics Concern  . None   Social History Narrative   Entered 06/2014:   His wife was also a patient of mine ([facility])   She passed away in 2015--secondary to ongoing smoker, lung cancer.    At [place] 06/2104 pt reports that one of their kids was already living with them--so he is not alone, even now .     Family History:  The patient's family history includes COPD in his father; Cancer in his father; Cancer (age of onset: 89) in his sister; Diabetes in his brother; Heart disease in his mother.   Review of Systems:   Please see the history of present illness.     General:  No chills, fever, night sweats or weight changes.  Cardiovascular:  No chest pain, edema, orthopnea, palpitations, paroxysmal nocturnal dyspnea. Positive for dyspnea on exertion.  Dermatological: No rash, lesions/masses Respiratory: No cough, dyspnea Urologic: No hematuria, dysuria Abdominal:   No nausea, vomiting, diarrhea, bright red blood per rectum, melena, or hematemesis Neurologic:  No visual changes, wkns, changes in mental status. All other systems reviewed and are otherwise negative except as noted above.   Physical Exam:    VS:  BP 122/74   Pulse 91   Ht 6\' 1"  (1.854 m)   Wt 194 lb (88 kg)   BMI 25.60 kg/m    General: Well developed, well nourished Caucasian male appearing in no acute distress. Head: Normocephalic, atraumatic, sclera non-icteric, no xanthomas, nares are without discharge.  Neck: No carotid bruits. JVD not elevated.  Lungs: Respirations  regular and unlabored, mild expiratory wheezing along upper lung fields bilaterally. Heart: Irregularly irregular. No S3 or S4.  No murmur, no rubs, or gallops appreciated. Abdomen: Soft, non-tender, non-distended with normoactive bowel sounds. No hepatomegaly. No rebound/guarding. No obvious abdominal masses. Msk:  Strength and tone appear normal for age. No joint deformities or effusions. Extremities: No clubbing or cyanosis. No lower extremity edema.  Distal pedal pulses are 2+ bilaterally. Neuro: Alert and oriented X 3. Moves all extremities spontaneously. No focal deficits noted. Psych:  Responds to questions appropriately with a normal affect. Skin: No rashes or lesions noted  Wt Readings from Last 3 Encounters:  03/01/17 194 lb (88 kg)  02/15/17 204 lb 6.4  oz (92.7 kg)  02/11/17 193 lb 14.4 oz (88 kg)     Studies/Labs Reviewed:   EKG:  EKG is ordered today.  The ekg ordered today demonstrates atrial fibrillation, HR 91.  Recent Labs: 10/12/2016: ALT 13 02/07/2017: B Natriuretic Peptide 295.9 02/08/2017: Hemoglobin 14.3; Platelets 292 02/11/2017: BUN 22; Creatinine, Ser 1.15; Potassium 4.8; Sodium 140   Lipid Panel    Component Value Date/Time   CHOL 148 10/12/2016 0842   TRIG 136 10/12/2016 0842   HDL 43 10/12/2016 0842   CHOLHDL 3.4 10/12/2016 0842   VLDL 27 10/12/2016 0842   LDLCALC 78 10/12/2016 0842    Additional studies/ records that were reviewed today include:   Echocardiogram: 02/08/2017  Study Conclusions  - Procedure narrative: Transthoracic echocardiography. Very   technically difficult study with reduced echo windows. Definity   contrast given. - Left ventricle: The cavity size was normal. Wall thickness was   increased in a pattern of mild LVH. Systolic function was normal.   The estimated ejection fraction was in the range of 60% to 65%.   Images were inadequate for LV wall motion assessment. The study   is not technically sufficient to allow evaluation  of LV diastolic   function. - Right ventricle: Moderately dilated - there appears to be severe   hypokinesis. - Right atrium: The atrium was mildly dilated. - Inferior vena cava: The vessel was dilated. The respirophasic   diameter changes were blunted (< 50%), consistent with elevated   central venous pressure. - Pericardium, extracardiac: There was no pericardial effusion.  Impressions:  - Very technically difficult study. Definity contrast given. LVEF   is preserved at 60-65%, however, there appears to be significant   RV dysfunction and dilitation. The IVC is dilated and does not   collapse. Consider cardiac MRI to further assess cardiac function   and particularly RV function.  Assessment:    1. Persistent atrial fibrillation ([place])   2. Current use of long term anticoagulation   3. Essential hypertension   4. Hyperlipidemia LDL goal <70   5. Peripheral vascular disease ([place])   6. Tobacco use      Plan:   In order of problems listed above:  1. Persistent Atrial Fibrillation/ Use of Long-term anticoagulation - he was recently admitted from 8/5 - 02/11/2017 for worsening dyspnea on exertion and fatigue, found to have a COPD exacerbation and to be in atrial fibrillation with RVR. He remained in atrial fibrillation but rates improved with titration of his Cardizem and the addition of Lopressor 25mg  BID.  - will continue Cardizem CD 240mg  BID. Increase Lopressor to 50mg  BID as his resting HR is slightly elevated in the 90's. Stop Clonidine as below.  - This patients CHA2DS2-VASc Score and unadjusted Ischemic Stroke Rate (% per year) is equal to 3.2 % stroke rate/year from a score of 3 (HTN, Age, Vascular). He denies any evidence of active bleeding. Continue Xarelto 20mg  daily for anticoagulation.   2. HTN - BP is well-controlled at 122/74 during today's visit. - continue Cardizem CD 240mg  BID. Will stop Clonidine as this was reduced during his hospitalization and he is  currently only taking 0.1mg  daily. Further titrate Lopressor to 50mg  BID.   3. HLD - Lipid Panel in 10/2016 showed total cholesterol of 148, HDL 43, and LDL 78. - continue Atorvastatin 40mg  daily.   4. PVD - s/p LICA stenting in 4098. He denies any recurrent claudication symptoms.  - distal pulses 2+ bilaterally by physical examination.  5. Tobacco Use - has reduced his use to less than 1 pack per week. Congratulated on this with complete cessation advised.    Medication Adjustments/Labs and Tests Ordered: Current medicines are reviewed at length with the patient today.  Concerns regarding medicines are outlined above.  Medication changes, Labs and Tests ordered today are listed in the Patient Instructions below. Patient Instructions  Medication Instructions:  STOP clonidine  INCREASE metoprolol (Lopressor) to 50 mg two times daily  Any Other Special Instructions Will Be Listed Below (If Applicable).  Please check your BP 2-3 times weekly, call with any concerns or issues.  If you need a refill on your cardiac medications before your next appointment, please call your pharmacy.    Signed, Erma Heritage, PA-C  03/01/2017 12:09 PM    [place], [place], [place]  [address] Phone: [phone]; Fax: [phone]  [address], [place], [place] [address] Phone: [phone]

## 2017-03-01 ENCOUNTER — Encounter: Payer: Self-pay | Admitting: Student

## 2017-03-01 ENCOUNTER — Ambulatory Visit (INDEPENDENT_AMBULATORY_CARE_PROVIDER_SITE_OTHER): Payer: PPO | Admitting: Student

## 2017-03-01 VITALS — BP 122/74 | HR 91 | Ht 73.0 in | Wt 194.0 lb

## 2017-03-01 DIAGNOSIS — I481 Persistent atrial fibrillation: Secondary | ICD-10-CM | POA: Diagnosis not present

## 2017-03-01 DIAGNOSIS — E785 Hyperlipidemia, unspecified: Secondary | ICD-10-CM | POA: Diagnosis not present

## 2017-03-01 DIAGNOSIS — Z7901 Long term (current) use of anticoagulants: Secondary | ICD-10-CM

## 2017-03-01 DIAGNOSIS — Z720 Tobacco use: Secondary | ICD-10-CM | POA: Diagnosis not present

## 2017-03-01 DIAGNOSIS — I4819 Other persistent atrial fibrillation: Secondary | ICD-10-CM

## 2017-03-01 DIAGNOSIS — I10 Essential (primary) hypertension: Secondary | ICD-10-CM

## 2017-03-01 DIAGNOSIS — I739 Peripheral vascular disease, unspecified: Secondary | ICD-10-CM | POA: Diagnosis not present

## 2017-03-01 MED ORDER — METOPROLOL TARTRATE 50 MG PO TABS: 50.0000 mg | ORAL_TABLET | Freq: Two times a day (BID) | ORAL | 3 refills | Status: DC

## 2017-03-01 NOTE — Patient Instructions (Signed)
Medication Instructions:  STOP clonidine  INCREASE metoprolol (Lopressor) to 50 mg two times daily  Follow-Up: Your physician wants you to follow-up in: 1 YEAR with Dr. Gwenlyn Found. You will receive a reminder letter in the mail two months in advance. If you don't receive a letter, please call our office to schedule the follow-up appointment.   Any Other Special Instructions Will Be Listed Below (If Applicable).  Please check your BP 2-3 times weekly, call with any concerns or issues.   If you need a refill on your cardiac medications before your next appointment, please call your pharmacy.

## 2017-03-06 ENCOUNTER — Other Ambulatory Visit: Payer: Self-pay | Admitting: Cardiovascular Disease

## 2017-03-09 NOTE — Telephone Encounter (Signed)
Rx(s) sent to pharmacy electronically.  

## 2017-03-12 DIAGNOSIS — R972 Elevated prostate specific antigen [PSA]: Secondary | ICD-10-CM | POA: Diagnosis not present

## 2017-03-14 DIAGNOSIS — J441 Chronic obstructive pulmonary disease with (acute) exacerbation: Secondary | ICD-10-CM | POA: Diagnosis not present

## 2017-03-24 DIAGNOSIS — R3912 Poor urinary stream: Secondary | ICD-10-CM | POA: Diagnosis not present

## 2017-03-24 DIAGNOSIS — N401 Enlarged prostate with lower urinary tract symptoms: Secondary | ICD-10-CM | POA: Diagnosis not present

## 2017-03-24 DIAGNOSIS — R972 Elevated prostate specific antigen [PSA]: Secondary | ICD-10-CM | POA: Diagnosis not present

## 2017-04-12 ENCOUNTER — Other Ambulatory Visit: Payer: Self-pay | Admitting: Cardiovascular Disease

## 2017-04-12 DIAGNOSIS — I10 Essential (primary) hypertension: Secondary | ICD-10-CM

## 2017-04-12 DIAGNOSIS — R0602 Shortness of breath: Secondary | ICD-10-CM

## 2017-04-13 DIAGNOSIS — J441 Chronic obstructive pulmonary disease with (acute) exacerbation: Secondary | ICD-10-CM | POA: Diagnosis not present

## 2017-04-14 ENCOUNTER — Ambulatory Visit: Payer: PPO | Admitting: Physician Assistant

## 2017-05-14 ENCOUNTER — Other Ambulatory Visit: Payer: Self-pay

## 2017-05-14 ENCOUNTER — Other Ambulatory Visit: Payer: Self-pay | Admitting: Physician Assistant

## 2017-05-14 DIAGNOSIS — J441 Chronic obstructive pulmonary disease with (acute) exacerbation: Secondary | ICD-10-CM | POA: Diagnosis not present

## 2017-05-14 MED ORDER — FUROSEMIDE 20 MG PO TABS: 20.0000 mg | ORAL_TABLET | Freq: Every day | ORAL | 0 refills | Status: DC

## 2017-05-14 NOTE — Telephone Encounter (Signed)
Refill appropriate 

## 2017-05-20 ENCOUNTER — Other Ambulatory Visit: Payer: Self-pay

## 2017-05-20 ENCOUNTER — Ambulatory Visit (INDEPENDENT_AMBULATORY_CARE_PROVIDER_SITE_OTHER): Payer: PPO | Admitting: Physician Assistant

## 2017-05-20 ENCOUNTER — Encounter: Payer: Self-pay | Admitting: Physician Assistant

## 2017-05-20 VITALS — BP 118/74 | HR 80 | Temp 97.6°F | Resp 16 | Ht 73.0 in | Wt 203.4 lb

## 2017-05-20 DIAGNOSIS — I739 Peripheral vascular disease, unspecified: Secondary | ICD-10-CM

## 2017-05-20 DIAGNOSIS — I10 Essential (primary) hypertension: Secondary | ICD-10-CM | POA: Diagnosis not present

## 2017-05-20 DIAGNOSIS — I480 Paroxysmal atrial fibrillation: Secondary | ICD-10-CM

## 2017-05-20 DIAGNOSIS — E559 Vitamin D deficiency, unspecified: Secondary | ICD-10-CM | POA: Diagnosis not present

## 2017-05-20 DIAGNOSIS — Z7901 Long term (current) use of anticoagulants: Secondary | ICD-10-CM | POA: Diagnosis not present

## 2017-05-20 DIAGNOSIS — R972 Elevated prostate specific antigen [PSA]: Secondary | ICD-10-CM

## 2017-05-20 DIAGNOSIS — F17200 Nicotine dependence, unspecified, uncomplicated: Secondary | ICD-10-CM | POA: Diagnosis not present

## 2017-05-20 DIAGNOSIS — E785 Hyperlipidemia, unspecified: Secondary | ICD-10-CM

## 2017-05-20 MED ORDER — FUROSEMIDE 20 MG PO TABS: ORAL_TABLET | ORAL | 1 refills | Status: DC

## 2017-05-20 NOTE — Progress Notes (Signed)
Patient ID: Antonio Wise AGE MRN: [id number], DOB: 09-Feb-1941, 76 y.o. Date of Encounter: @DATE @  Chief Complaint:  Chief Complaint  Patient presents with  . routine office visit    HPI: 76 y.o. year old white male  presents for routine followup.  His wife had seen me as a patient in the past also---she died from cancer 2014-01-14.  At office visit 01/2015 we discussed the fact that one of his daughters lives with him and another daughter lives across the street from him.  He had a OV with me on 04/06/13 for evaluation of COPD.  He had PFTs.  At that visit I prescribed Chantix and also prescribed Symbicort.  Considered adding Spiriva but cost has been an issue.  Recently, he had been seeing Dr. Gwenlyn Found just once a year.   Also still sees Urology once a year-- 04/08/2016---says he just had a visit there with them last week and again they said everything looked okay and can wait one year for follow-up.   His last ROV with me was 10/12/2016.  Since then, he had OV with me to f/u after hospitalization.  Was hospitalized 02/07/17 through 02/11/17.  Hospitalized secondary to COPD exacerbation, acute respiratory failure with hypoxia and hypercapnia, atrial fibrillation with rapid ventricular response.  Also had follow-up office visit at cardiology on 03/01/17.  Today he reports that he has had no recurrent COPD exacerbation symptoms at all.  Reports that his breathing has remained stable.  No complaints today.  No specific concerns to address today.  Says that everything has been feeling stable. He is taking blood pressure medications as directed. He is taking cholesterol medication as directed. No myalgias or other adverse effects. Has decreased his smoking a lot and now is only smoking about 1/3 ppd. Again today is requesting samples of  Breo and Xarelto.   Past Medical History:  Diagnosis Date  . Colon polyp 05/06/2008   repeat 5 years  . COPD (chronic obstructive pulmonary disease)  ([place])   . Duodenal ulcer 05/06/2008   EGD  . Elevated PSA   . Gastric ulcer 05/06/2008   EGD  . H/O Helicobacter infection 05/06/2008   EGD  . History of nuclear stress test 02/19/2010   bruce protocol; mild-mod perfusion defect in spetal region consistent with attenuation artifact; no significant ischemia demonstrated; dysrhythmia during exercise  . Hyperlipidemia   . Hypertension   . Peripheral vascular disease ([place])    a. s/p left common iliac artery stenting in 2006  . Persistent atrial fibrillation ([place])    a. on Xarelto  . Smoker unmotivated to quit   . Vitamin D deficiency      Home Meds:  Outpatient Medications Prior to Visit  Medication Sig Dispense Refill  . atorvastatin (LIPITOR) 40 MG tablet TAKE 1 TABLET(40 MG) BY MOUTH DAILY AT 6 PM 90 tablet 3  . benazepril (LOTENSIN) 20 MG tablet Take 1 tablet (20 mg total) by mouth daily. 30 tablet 2  . Cholecalciferol 4000 units CAPS Take 1 capsule (4,000 Units total) by mouth daily. 30 capsule 3  . diltiazem (CARDIZEM CD) 240 MG 24 hr capsule Take 2 capsules (480 mg total) by mouth daily. 60 capsule 2  . fluticasone furoate-vilanterol (BREO ELLIPTA) 100-25 MCG/INH AEPB Inhale 1 puff into the lungs daily as needed (shortness of breath).    . furosemide (LASIX) 20 MG tablet TAKE 1 TABLET(20 MG) BY MOUTH DAILY 90 tablet 0  . levalbuterol (XOPENEX) 0.63 MG/3ML nebulizer  solution Take 3 mLs (0.63 mg total) by nebulization every 6 (six) hours as needed for wheezing or shortness of breath. 3 mL 12  . metoprolol tartrate (LOPRESSOR) 50 MG tablet Take 1 tablet (50 mg total) by mouth 2 (two) times daily. 180 tablet 3  . Multiple Minerals-Vitamins (CALCIUM & VIT D3 BONE HEALTH PO) Take 1 tablet by mouth daily.    Alveda Reasons 20 MG TABS tablet TAKE 1 TABLET(20 MG) BY MOUTH DAILY WITH SUPPER 90 tablet 1  . benazepril (LOTENSIN) 40 MG tablet Take 1 tablet by mouth daily.  0   No facility-administered medications prior to visit.      Allergies:    Allergies  Allergen Reactions  . Codeine Nausea And Vomiting    Severe nausea    Social History   Socioeconomic History  . Marital status: Married    Spouse name: Not on file  . Number of children: 3  . Years of education: Not on file  . Highest education level: Not on file  Social Needs  . Financial resource strain: Not on file  . Food insecurity - worry: Not on file  . Food insecurity - inability: Not on file  . Transportation needs - medical: Not on file  . Transportation needs - non-medical: Not on file  Occupational History    Employer: [employer]  Tobacco Use  . Smoking status: Current Some Day Smoker    Packs/day: 0.20    Types: Cigarettes    Last attempt to quit: 05/17/2013    Years since quitting: 4.0  . Smokeless tobacco: Never Used  Substance and Sexual Activity  . Alcohol use: No  . Drug use: No  . Sexual activity: Not on file  Other Topics Concern  . Not on file  Social History Narrative   Entered 06/2014:   His wife was also a patient of mine ([facility])   She passed away in 2015--secondary to ongoing smoker, lung cancer.    At [place] 06/2104 pt reports that one of their kids was already living with them--so he is not alone, even now .    Family History  Problem Relation Age of Onset  . Heart disease Mother   . COPD Father   . Cancer Father   . Cancer Sister 32       Breast Cancer  . Diabetes Brother      Review of Systems:  See HPI for pertinent ROS. All other ROS negative.    Physical Exam: Blood pressure 118/74, pulse 80, temperature 97.6 F (36.4 C), temperature source Oral, resp. rate 16, height 6\' 1"  (1.854 m), weight 92.3 kg (203 lb 6.4 oz), SpO2 97 %., Body mass index is 26.84 kg/m. General: WNWD WM. Appears in no acute distress. Neck: Supple. No thyromegaly. No lymphadenopathy. No carotid bruits. Lungs: Breath sounds are decreased and distant. However there are no active wheezes. Heart: RRR with S1 S2. No murmurs, rubs, or  gallops. Abdomen: Soft, non-tender, non-distended with normoactive bowel sounds. No hepatomegaly. No rebound/guarding. No obvious abdominal masses. Musculoskeletal:  Strength and tone normal for age. Extremities/Skin: Warm and dry. No LE edema.  Neuro: Alert and oriented X 3. Moves all extremities spontaneously. Gait is normal. CNII-XII grossly in tact. Psych:  Responds to questions appropriately with a normal affect.     ASSESSMENT AND PLAN:  76 y.o. year old male with  At [place] 05/20/2017--gave him samples of Breo and Xarelto--Check for Samples at Each OV  1. Hypertension Blood pressure is  at goal. Continue current medications. Check labs monitor. - COMPLETE METABOLIC PANEL WITH GFR   2. Hyperlipidemia On Lipitor . Due to recheck now. - COMPLETE METABOLIC PANEL WITH GFR - Lipid Panel   3. Vitamin D deficiency 10/22/16 had him add vitamin D 4000 units daily.  Will recheck vitamin D level to monitor.  4. Smoker --has almost completely quit 04/2013--He was motivated to quit finally- Given recent DOT physical info and fact that his wife, a heavy smoker, had lung cancer (has passed away 2013/10/15).  In past, his PAD was not motivation enough.  Because of wife's death and "his nerves" he did start back smoking but is motivated to quit--again. At [place] 01/2015 was smoking about one half pack per day but was agreeable to use Chantix again. At [place] 01/2015 a printed him to suffer prescriptions. One for the starting dose pack and one for the continuing Dosepaks with several refills on that one.  At [place] 05/2015 I discussed with him whether he feels that he needs to get back on the Chantix to make sure he does not start back to routine smoking. He does not feel that he needs the Chantix but I told him to call me if he finds himself smoking more. At [facility] 04/08/2016---smoking 8 - 10 cig per day At [facility] 4/9/2-018---down to 6 cigarettes per week--Goes multiple days with no cigarette!! 05/20/17--- port smoking 1/3  ppd  5. Peripheral vascular disease Managed by Dr. Gwenlyn Found - COMPLETE METABOLIC PANEL WITH GFR - Lipid Panel   6. Paroxysmal atrial fibrillation Managed by Dr. Gwenlyn Found  7. Elevated PSA History of negative biopsy. Sees urology annually.   8. COPD (chronic obstructive pulmonary disease) Stable.  Continue current medications.  Continue to limit smoking as much as possible.  9. Screening for colorectal cancer had colonoscopy  November 2009. It showed polyp. Repeat 5 years. Had Colonoscopy November 2015--3 polyps--f/u 5 years.    10. Hyperglycemia - Hemoglobin A1c----has been normal.  We will not recheck at this time.  11. 04/08/2016--GFR 36---HCTZ D/Ced --- monitor renal function   12. History of negative Cardiolite.  13. DEXA scan August 2009 normal  14. Immunizations: Influenza: He did receive this fall/2014 here --Given here 06/11/2014. Given here 06/05/2015. Given here 04/08/2016.  05/20/17 he reports that he is already gotten flu vaccine for the season. Pneumovax was given here October 2012. Gave  Prevnar 13--given here  06/14/2013. Tetanus vaccine:  T dapGiven here 11/2013 Zostavax: At prior OV: Told him to check with insurance regarding cost and let us know if wants RX sent t receive this.   At [place] 01/10/2015: Says that he forgot to check on the cost but says that he has Medicare and a supplemental insurance. As even if they do not cover he is agreeable to pay the $200 to prevent shingles.                                       Says to go ahead and send prescription to his pharmacy so I had Margreta Journey do so today.  At [place] 06/05/15--he reports that he did go to the pharmacy and received this vaccine  ROV 6 months or sooner as needed.  Marin Olp [place], [place], [facility] 05/20/2017 8:38 AM

## 2017-05-21 LAB — COMPLETE METABOLIC PANEL WITH GFR
AG Ratio: 2 (calc) (ref 1.0–2.5)
ALKALINE PHOSPHATASE (APISO): 81 U/L (ref 40–115)
ALT: 18 U/L (ref 9–46)
AST: 15 U/L (ref 10–35)
Albumin: 4.5 g/dL (ref 3.6–5.1)
BILIRUBIN TOTAL: 0.8 mg/dL (ref 0.2–1.2)
BUN/Creatinine Ratio: 13 (calc) (ref 6–22)
BUN: 16 mg/dL (ref 7–25)
CHLORIDE: 104 mmol/L (ref 98–110)
CO2: 29 mmol/L (ref 20–32)
Calcium: 9.6 mg/dL (ref 8.6–10.3)
Creat: 1.25 mg/dL — ABNORMAL HIGH (ref 0.70–1.18)
GFR, Est African American: 65 mL/min/{1.73_m2} (ref >=60)
GFR, Est Non African American: 56 mL/min/{1.73_m2} — ABNORMAL LOW (ref >=60)
Globulin: 2.3 g/dL (calc) (ref 1.9–3.7)
Glucose, Bld: 107 mg/dL — ABNORMAL HIGH (ref 65–99)
Potassium: 5.1 mmol/L (ref 3.5–5.3)
Sodium: 144 mmol/L (ref 135–146)
TOTAL PROTEIN: 6.8 g/dL (ref 6.1–8.1)

## 2017-05-21 LAB — LIPID PANEL
CHOLESTEROL: 152 mg/dL (ref <200)
HDL: 45 mg/dL (ref >40)
LDL CHOLESTEROL (CALC): 87 mg/dL
Non-HDL Cholesterol (Calc): 107 mg/dL (calc) (ref <130)
TRIGLYCERIDES: 100 mg/dL (ref <150)
Total CHOL/HDL Ratio: 3.4 (calc) (ref <5.0)

## 2017-05-21 LAB — VITAMIN D 25 HYDROXY (VIT D DEFICIENCY, FRACTURES): VIT D 25 HYDROXY: 28 ng/mL — AB (ref 30–100)

## 2017-06-07 DIAGNOSIS — H35373 Puckering of macula, bilateral: Secondary | ICD-10-CM | POA: Diagnosis not present

## 2017-06-07 DIAGNOSIS — H31093 Other chorioretinal scars, bilateral: Secondary | ICD-10-CM | POA: Diagnosis not present

## 2017-06-07 DIAGNOSIS — H34212 Partial retinal artery occlusion, left eye: Secondary | ICD-10-CM | POA: Diagnosis not present

## 2017-06-07 DIAGNOSIS — Z961 Presence of intraocular lens: Secondary | ICD-10-CM | POA: Diagnosis not present

## 2017-06-11 ENCOUNTER — Telehealth: Payer: Self-pay

## 2017-06-11 MED ORDER — DILTIAZEM HCL ER COATED BEADS 240 MG PO CP24: 480.0000 mg | ORAL_CAPSULE | Freq: Every day | ORAL | 2 refills | Status: DC

## 2017-06-11 NOTE — Telephone Encounter (Signed)
Refill appropriate 

## 2017-06-13 DIAGNOSIS — J441 Chronic obstructive pulmonary disease with (acute) exacerbation: Secondary | ICD-10-CM | POA: Diagnosis not present

## 2017-07-10 ENCOUNTER — Other Ambulatory Visit: Payer: Self-pay | Admitting: Physician Assistant

## 2017-07-12 NOTE — Telephone Encounter (Signed)
Refill appropriate 

## 2017-07-13 ENCOUNTER — Other Ambulatory Visit: Payer: Self-pay | Admitting: Gastroenterology

## 2017-07-13 DIAGNOSIS — K625 Hemorrhage of anus and rectum: Secondary | ICD-10-CM | POA: Diagnosis not present

## 2017-07-14 ENCOUNTER — Telehealth: Payer: Self-pay | Admitting: *Deleted

## 2017-07-14 DIAGNOSIS — J441 Chronic obstructive pulmonary disease with (acute) exacerbation: Secondary | ICD-10-CM | POA: Diagnosis not present

## 2017-07-14 NOTE — Telephone Encounter (Signed)
NOT SURGICAL CLEARANCE JUST AN OK TO D/C XARELTO PATIENT HAVING RECTAL BLEEDING      [facility] Pre-operative Risk Assessment    Request for holding Xarelto:  1. What type of procedure is being performed? COLONOSCOPY    2. When is this procedure scheduled? 07/21/17   3. Are there any medications that need to be held prior to surgery and how long?XARELTO PATIENT IS HAVING RECTAL BLEEDING    4. Practice name and name of physician performing surgery? DR [name] [facility]   5. What is your office phone and fax number? PHONE [phone] FAX [phone]   6. Anesthesia type (None, local, MAC, general) ?  UNKNOWN     REQUESTING ASAP!!!

## 2017-07-15 NOTE — Telephone Encounter (Signed)
   Primary Cardiologist: No primary care provider on file.  Chart reviewed as part of pre-operative protocol coverage. Patient was contacted 07/15/2017 in reference to pre-operative risk assessment for pending surgery as outlined below.  Antonio Wise was last seen on 03/01/2017 by [place].  Since that day, Antonio Wise has done well. He has baseline dyspnea due to COPD, this has not changed. He denies any chest pain.   Therefore, based on ACC/AHA guidelines, the patient would be at acceptable risk for the planned procedure without further cardiovascular testing.    I will forward this to our pharmacist pool regarding how long to hold Xarelto prior to colonoscopy.  [place], [place] 07/15/2017, 2:47 PM

## 2017-07-15 NOTE — Telephone Encounter (Signed)
Patient was cleared. See recommendation by our clinical pharmacist. Will forward to callback pool

## 2017-07-15 NOTE — Telephone Encounter (Signed)
Pt takes Xarelto for afib with CHADS2VASc score of 3, almost 14 (age (x2 on 1/25), HTN, PVD). CrCl is 67. Ok to hold Xarelto 1-2 days prior to procedure as required.

## 2017-07-19 ENCOUNTER — Encounter (HOSPITAL_COMMUNITY): Payer: Self-pay | Admitting: Emergency Medicine

## 2017-07-19 ENCOUNTER — Emergency Department (HOSPITAL_COMMUNITY): Payer: PPO

## 2017-07-19 ENCOUNTER — Ambulatory Visit (INDEPENDENT_AMBULATORY_CARE_PROVIDER_SITE_OTHER): Payer: PPO | Admitting: Physician Assistant

## 2017-07-19 ENCOUNTER — Inpatient Hospital Stay (HOSPITAL_COMMUNITY)
Admission: EM | Admit: 2017-07-19 | Discharge: 2017-07-23 | DRG: 189 | Disposition: A | Discharge status: Home / Self Care | Payer: PPO | Attending: Internal Medicine | Admitting: Internal Medicine

## 2017-07-19 ENCOUNTER — Other Ambulatory Visit: Payer: Self-pay

## 2017-07-19 ENCOUNTER — Encounter: Payer: Self-pay | Admitting: Physician Assistant

## 2017-07-19 VITALS — BP 170/64 | HR 124

## 2017-07-19 DIAGNOSIS — I4891 Unspecified atrial fibrillation: Secondary | ICD-10-CM | POA: Diagnosis not present

## 2017-07-19 DIAGNOSIS — Z66 Do not resuscitate: Secondary | ICD-10-CM | POA: Diagnosis present

## 2017-07-19 DIAGNOSIS — I10 Essential (primary) hypertension: Secondary | ICD-10-CM | POA: Diagnosis not present

## 2017-07-19 DIAGNOSIS — Z825 Family history of asthma and other chronic lower respiratory diseases: Secondary | ICD-10-CM | POA: Diagnosis not present

## 2017-07-19 DIAGNOSIS — T502X5A Adverse effect of carbonic-anhydrase inhibitors, benzothiadiazides and other diuretics, initial encounter: Secondary | ICD-10-CM | POA: Diagnosis not present

## 2017-07-19 DIAGNOSIS — I739 Peripheral vascular disease, unspecified: Secondary | ICD-10-CM | POA: Diagnosis present

## 2017-07-19 DIAGNOSIS — Z86010 Personal history of colonic polyps: Secondary | ICD-10-CM

## 2017-07-19 DIAGNOSIS — R05 Cough: Secondary | ICD-10-CM | POA: Diagnosis not present

## 2017-07-19 DIAGNOSIS — J9601 Acute respiratory failure with hypoxia: Secondary | ICD-10-CM

## 2017-07-19 DIAGNOSIS — N179 Acute kidney failure, unspecified: Secondary | ICD-10-CM | POA: Diagnosis present

## 2017-07-19 DIAGNOSIS — Z7951 Long term (current) use of inhaled steroids: Secondary | ICD-10-CM | POA: Diagnosis not present

## 2017-07-19 DIAGNOSIS — E860 Dehydration: Secondary | ICD-10-CM | POA: Diagnosis not present

## 2017-07-19 DIAGNOSIS — I482 Chronic atrial fibrillation, unspecified: Secondary | ICD-10-CM | POA: Diagnosis present

## 2017-07-19 DIAGNOSIS — R739 Hyperglycemia, unspecified: Secondary | ICD-10-CM | POA: Diagnosis not present

## 2017-07-19 DIAGNOSIS — E559 Vitamin D deficiency, unspecified: Secondary | ICD-10-CM | POA: Diagnosis not present

## 2017-07-19 DIAGNOSIS — Z7901 Long term (current) use of anticoagulants: Secondary | ICD-10-CM | POA: Diagnosis not present

## 2017-07-19 DIAGNOSIS — I129 Hypertensive chronic kidney disease with stage 1 through stage 4 chronic kidney disease, or unspecified chronic kidney disease: Secondary | ICD-10-CM | POA: Diagnosis not present

## 2017-07-19 DIAGNOSIS — J441 Chronic obstructive pulmonary disease with (acute) exacerbation: Secondary | ICD-10-CM | POA: Diagnosis not present

## 2017-07-19 DIAGNOSIS — Z833 Family history of diabetes mellitus: Secondary | ICD-10-CM

## 2017-07-19 DIAGNOSIS — E785 Hyperlipidemia, unspecified: Secondary | ICD-10-CM | POA: Diagnosis not present

## 2017-07-19 DIAGNOSIS — Z885 Allergy status to narcotic agent status: Secondary | ICD-10-CM | POA: Diagnosis not present

## 2017-07-19 DIAGNOSIS — Z8249 Family history of ischemic heart disease and other diseases of the circulatory system: Secondary | ICD-10-CM

## 2017-07-19 DIAGNOSIS — R0602 Shortness of breath: Secondary | ICD-10-CM | POA: Diagnosis not present

## 2017-07-19 DIAGNOSIS — J9600 Acute respiratory failure, unspecified whether with hypoxia or hypercapnia: Secondary | ICD-10-CM | POA: Diagnosis not present

## 2017-07-19 DIAGNOSIS — J9602 Acute respiratory failure with hypercapnia: Secondary | ICD-10-CM | POA: Diagnosis not present

## 2017-07-19 DIAGNOSIS — F17210 Nicotine dependence, cigarettes, uncomplicated: Secondary | ICD-10-CM | POA: Diagnosis not present

## 2017-07-19 DIAGNOSIS — I481 Persistent atrial fibrillation: Secondary | ICD-10-CM | POA: Diagnosis not present

## 2017-07-19 DIAGNOSIS — E875 Hyperkalemia: Secondary | ICD-10-CM | POA: Diagnosis not present

## 2017-07-19 DIAGNOSIS — E861 Hypovolemia: Secondary | ICD-10-CM | POA: Diagnosis not present

## 2017-07-19 DIAGNOSIS — J9621 Acute and chronic respiratory failure with hypoxia: Principal | ICD-10-CM | POA: Diagnosis present

## 2017-07-19 DIAGNOSIS — N183 Chronic kidney disease, stage 3 (moderate): Secondary | ICD-10-CM | POA: Diagnosis not present

## 2017-07-19 HISTORY — DX: Personal history of other diseases of the digestive system: Z87.19

## 2017-07-19 LAB — BASIC METABOLIC PANEL
Anion gap: 14 (ref 5–15)
BUN: 21 mg/dL — AB (ref 6–20)
CHLORIDE: 100 mmol/L — AB (ref 101–111)
CO2: 27 mmol/L (ref 22–32)
Calcium: 9.1 mg/dL (ref 8.9–10.3)
Creatinine, Ser: 2.09 mg/dL — ABNORMAL HIGH (ref 0.61–1.24)
GFR calc Af Amer: 34 mL/min — ABNORMAL LOW (ref >60)
GFR calc non Af Amer: 30 mL/min — ABNORMAL LOW (ref >60)
GLUCOSE: 215 mg/dL — AB (ref 65–99)
POTASSIUM: 4.9 mmol/L (ref 3.5–5.1)
Sodium: 141 mmol/L (ref 135–145)

## 2017-07-19 LAB — CBC WITH DIFFERENTIAL/PLATELET
BASOS ABS: 0.1 10*3/uL (ref 0.0–0.1)
BASOS PCT: 0 %
EOS ABS: 0 10*3/uL (ref 0.0–0.7)
EOS PCT: 0 %
HEMATOCRIT: 49.4 % (ref 39.0–52.0)
Hemoglobin: 15.5 g/dL (ref 13.0–17.0)
Lymphocytes Relative: 21 %
Lymphs Abs: 3.6 10*3/uL (ref 0.7–4.0)
MCH: 32 pg (ref 26.0–34.0)
MCHC: 31.4 g/dL (ref 30.0–36.0)
MCV: 101.9 fL — ABNORMAL HIGH (ref 78.0–100.0)
MONOS PCT: 16 %
Monocytes Absolute: 2.7 10*3/uL — ABNORMAL HIGH (ref 0.1–1.0)
NEUTROS ABS: 10.9 10*3/uL — AB (ref 1.7–7.7)
Neutrophils Relative %: 63 %
PLATELETS: 443 10*3/uL — AB (ref 150–400)
RBC: 4.85 MIL/uL (ref 4.22–5.81)
RDW: 14.6 % (ref 11.5–15.5)
WBC: 17.3 10*3/uL — ABNORMAL HIGH (ref 4.0–10.5)

## 2017-07-19 LAB — I-STAT CHEM 8, ED
BUN: 25 mg/dL — ABNORMAL HIGH (ref 6–20)
CHLORIDE: 100 mmol/L — AB (ref 101–111)
CREATININE: 1.8 mg/dL — AB (ref 0.61–1.24)
Calcium, Ion: 1.13 mmol/L — ABNORMAL LOW (ref 1.15–1.40)
GLUCOSE: 223 mg/dL — AB (ref 65–99)
HCT: 49 % (ref 39.0–52.0)
Hemoglobin: 16.7 g/dL (ref 13.0–17.0)
POTASSIUM: 4.3 mmol/L (ref 3.5–5.1)
Sodium: 141 mmol/L (ref 135–145)
TCO2: 28 mmol/L (ref 22–32)

## 2017-07-19 LAB — CBG MONITORING, ED
Glucose-Capillary: 227 mg/dL — ABNORMAL HIGH (ref 65–99)
Glucose-Capillary: 237 mg/dL — ABNORMAL HIGH (ref 65–99)

## 2017-07-19 LAB — HEMOGLOBIN A1C
Hgb A1c MFr Bld: 5.6 % (ref 4.8–5.6)
MEAN PLASMA GLUCOSE: 114.02 mg/dL

## 2017-07-19 LAB — TROPONIN I: Troponin I: <0.03 ng/mL (ref <0.03)

## 2017-07-19 LAB — TSH: TSH: 0.711 u[IU]/mL (ref 0.350–4.500)

## 2017-07-19 MED ORDER — METOPROLOL TARTRATE 5 MG/5ML IV SOLN
5.0000 mg | Freq: Once | INTRAVENOUS | Status: AC
  Administered 2017-07-19: 5 mg via INTRAVENOUS
  Filled 2017-07-19: qty 5

## 2017-07-19 MED ORDER — LEVOFLOXACIN 500 MG PO TABS
500.0000 mg | ORAL_TABLET | Freq: Every day | ORAL | Status: DC
  Administered 2017-07-19 – 2017-07-20 (×2): 500 mg via ORAL
  Filled 2017-07-19 (×3): qty 1

## 2017-07-19 MED ORDER — ONDANSETRON HCL 4 MG/2ML IJ SOLN: 4.0000 mg | Freq: Four times a day (QID) | INTRAMUSCULAR | Status: DC | PRN

## 2017-07-19 MED ORDER — BISACODYL 5 MG PO TBEC: 5.0000 mg | DELAYED_RELEASE_TABLET | Freq: Every day | ORAL | Status: DC | PRN

## 2017-07-19 MED ORDER — RIVAROXABAN 15 MG PO TABS
15.0000 mg | ORAL_TABLET | Freq: Every day | ORAL | Status: DC
  Administered 2017-07-19 – 2017-07-21 (×2): 15 mg via ORAL
  Filled 2017-07-19 (×3): qty 1

## 2017-07-19 MED ORDER — ALBUTEROL (5 MG/ML) CONTINUOUS INHALATION SOLN
10.0000 mg/h | INHALATION_SOLUTION | Freq: Once | RESPIRATORY_TRACT | Status: AC
  Administered 2017-07-19: 10 mg/h via RESPIRATORY_TRACT
  Filled 2017-07-19: qty 20

## 2017-07-19 MED ORDER — TRAZODONE HCL 50 MG PO TABS
25.0000 mg | ORAL_TABLET | Freq: Every evening | ORAL | Status: DC | PRN
  Administered 2017-07-21: 25 mg via ORAL
  Filled 2017-07-19: qty 1

## 2017-07-19 MED ORDER — INSULIN ASPART 100 UNIT/ML ~~LOC~~ SOLN
0.0000 [IU] | Freq: Every day | SUBCUTANEOUS | Status: DC
  Administered 2017-07-19 – 2017-07-22 (×2): 2 [IU] via SUBCUTANEOUS
  Filled 2017-07-19: qty 1

## 2017-07-19 MED ORDER — METHYLPREDNISOLONE SODIUM SUCC 40 MG IJ SOLR
40.0000 mg | Freq: Four times a day (QID) | INTRAMUSCULAR | Status: DC
  Administered 2017-07-19: 40 mg via INTRAVENOUS
  Filled 2017-07-19: qty 1

## 2017-07-19 MED ORDER — METHYLPREDNISOLONE SODIUM SUCC 125 MG IJ SOLR: 125.0000 mg | Freq: Once | INTRAMUSCULAR | Status: DC

## 2017-07-19 MED ORDER — FLUTICASONE FUROATE-VILANTEROL 100-25 MCG/INH IN AEPB
1.0000 | INHALATION_SPRAY | Freq: Every day | RESPIRATORY_TRACT | Status: DC
  Administered 2017-07-21 – 2017-07-23 (×3): 1 via RESPIRATORY_TRACT
  Filled 2017-07-19: qty 28

## 2017-07-19 MED ORDER — LEVALBUTEROL HCL 0.63 MG/3ML IN NEBU
0.6300 mg | INHALATION_SOLUTION | Freq: Four times a day (QID) | RESPIRATORY_TRACT | Status: DC | PRN
  Administered 2017-07-20: 0.63 mg via RESPIRATORY_TRACT
  Filled 2017-07-19: qty 3

## 2017-07-19 MED ORDER — METHYLPREDNISOLONE SODIUM SUCC 125 MG IJ SOLR
80.0000 mg | Freq: Two times a day (BID) | INTRAMUSCULAR | Status: DC
  Administered 2017-07-20 – 2017-07-23 (×7): 80 mg via INTRAVENOUS
  Filled 2017-07-19 (×7): qty 2

## 2017-07-19 MED ORDER — METHYLPREDNISOLONE SODIUM SUCC 40 MG IJ SOLR: 40.0000 mg | Freq: Four times a day (QID) | INTRAMUSCULAR | Status: DC

## 2017-07-19 MED ORDER — ATORVASTATIN CALCIUM 40 MG PO TABS
40.0000 mg | ORAL_TABLET | Freq: Every day | ORAL | Status: DC
  Administered 2017-07-19 – 2017-07-22 (×4): 40 mg via ORAL
  Filled 2017-07-19 (×5): qty 1

## 2017-07-19 MED ORDER — ACETAMINOPHEN 500 MG PO TABS
500.0000 mg | ORAL_TABLET | Freq: Four times a day (QID) | ORAL | Status: DC | PRN
  Administered 2017-07-20 – 2017-07-22 (×2): 500 mg via ORAL
  Filled 2017-07-19 (×2): qty 1

## 2017-07-19 MED ORDER — METOPROLOL TARTRATE 50 MG PO TABS
50.0000 mg | ORAL_TABLET | Freq: Two times a day (BID) | ORAL | Status: DC
  Administered 2017-07-19 – 2017-07-20 (×3): 50 mg via ORAL
  Filled 2017-07-19: qty 2
  Filled 2017-07-19 (×2): qty 1
  Filled 2017-07-19: qty 2

## 2017-07-19 MED ORDER — SODIUM CHLORIDE 0.9 % IV SOLN
INTRAVENOUS | Status: DC
  Administered 2017-07-19: 23:00:00 via INTRAVENOUS

## 2017-07-19 MED ORDER — DILTIAZEM HCL ER COATED BEADS 240 MG PO CP24
240.0000 mg | ORAL_CAPSULE | Freq: Two times a day (BID) | ORAL | Status: DC
  Administered 2017-07-19 – 2017-07-23 (×8): 240 mg via ORAL
  Filled 2017-07-19 (×2): qty 1
  Filled 2017-07-19: qty 2
  Filled 2017-07-19 (×4): qty 1
  Filled 2017-07-19: qty 2
  Filled 2017-07-19 (×3): qty 1

## 2017-07-19 MED ORDER — VITAMIN D 1000 UNITS PO TABS
4000.0000 [IU] | ORAL_TABLET | Freq: Every day | ORAL | Status: DC
  Administered 2017-07-20 – 2017-07-23 (×4): 4000 [IU] via ORAL
  Filled 2017-07-19 (×4): qty 4

## 2017-07-19 MED ORDER — IPRATROPIUM-ALBUTEROL 0.5-2.5 (3) MG/3ML IN SOLN
3.0000 mL | Freq: Once | RESPIRATORY_TRACT | Status: AC
  Administered 2017-07-19: 3 mL via RESPIRATORY_TRACT
  Filled 2017-07-19: qty 3

## 2017-07-19 MED ORDER — INSULIN ASPART 100 UNIT/ML ~~LOC~~ SOLN
0.0000 [IU] | Freq: Three times a day (TID) | SUBCUTANEOUS | Status: DC
  Administered 2017-07-19: 5 [IU] via SUBCUTANEOUS
  Administered 2017-07-20: 3 [IU] via SUBCUTANEOUS
  Administered 2017-07-20: 2 [IU] via SUBCUTANEOUS
  Administered 2017-07-20: 5 [IU] via SUBCUTANEOUS
  Administered 2017-07-21 (×2): 3 [IU] via SUBCUTANEOUS
  Administered 2017-07-21: 5 [IU] via SUBCUTANEOUS
  Administered 2017-07-22: 3 [IU] via SUBCUTANEOUS
  Administered 2017-07-22: 2 [IU] via SUBCUTANEOUS
  Administered 2017-07-22: 3 [IU] via SUBCUTANEOUS
  Administered 2017-07-23: 2 [IU] via SUBCUTANEOUS
  Administered 2017-07-23: 3 [IU] via SUBCUTANEOUS
  Filled 2017-07-19 (×2): qty 1

## 2017-07-19 MED ORDER — CHOLECALCIFEROL 100 MCG (4000 UT) PO CAPS: 1.0000 | ORAL_CAPSULE | Freq: Every day | ORAL | Status: DC

## 2017-07-19 MED ORDER — BENAZEPRIL HCL 20 MG PO TABS: 20.0000 mg | ORAL_TABLET | Freq: Every day | ORAL | Status: DC

## 2017-07-19 MED ORDER — GUAIFENESIN ER 600 MG PO TB12
600.0000 mg | ORAL_TABLET | Freq: Two times a day (BID) | ORAL | Status: DC
  Administered 2017-07-19 – 2017-07-23 (×8): 600 mg via ORAL
  Filled 2017-07-19 (×8): qty 1

## 2017-07-19 MED ORDER — ONDANSETRON HCL 4 MG PO TABS: 4.0000 mg | ORAL_TABLET | Freq: Four times a day (QID) | ORAL | Status: DC | PRN

## 2017-07-19 NOTE — ED Notes (Signed)
Pt CBG, 227.

## 2017-07-19 NOTE — ED Notes (Signed)
Patient request RN called daughter to give update, RN spoke with daughter Faythe Dingwall. Daughter advised she will be here shortly.

## 2017-07-19 NOTE — Progress Notes (Addendum)
Patient ID: Antonio Wise MRN: [id number], DOB: [birth year], 77 y.o. Date of Encounter: @DATE @  Chief Complaint:  Chief Complaint  Patient presents with  . Shortness of Breath    x3days    HPI: 77 y.o. year old male  presents with above.   Even prior to patient arriving for his visit, he was on the schedule for "shortness of breath ".   I knew that he had had prior admission to the hospital with COPD exacerbation and rapid A. Fib.  As soon as staff was rooming patient, they informed me that his oxygen saturation level was reading 60% and that his heart rate was reading in the 120s. He appeared ashen color and appeared extremely weak.  I immediately called EMS.  Started oxygen nasal cannula and obtained EKG.  EMS arrived and started nebulizer treatment. Currently EMS is still with patient and is getting ready to transfer him to the hospital. Oxygen saturation went from 60% to 88% with just the oxygen therapy alone and then once the nebulizer was added saturation went to 95%.  Patient reports that he has been having similar symptoms for 2-3 days.  Has had no known fever. Used albuterol treatment prior to coming here. He got very little sleep last night secondary to this shortness of breath.  Notes that he "has been passing blood" 2 days before Christmas.  Therefore is scheduled for colonoscopy.  Reports that he has "still been passing a little bit of blood ".     Past Medical History:  Diagnosis Date  . Colon polyp 05/06/2008   repeat 5 years  . COPD (chronic obstructive pulmonary disease) ([place])   . Duodenal ulcer 05/06/2008   EGD  . Elevated PSA   . Gastric ulcer 05/06/2008   EGD  . H/O Helicobacter infection 05/06/2008   EGD  . History of nuclear stress test 02/19/2010   bruce protocol; mild-mod perfusion defect in spetal region consistent with attenuation artifact; no significant ischemia demonstrated; dysrhythmia during exercise  . Hyperlipidemia   . Hypertension     . Peripheral vascular disease ([place])    a. s/p left common iliac artery stenting in 2006  . Persistent atrial fibrillation ([place])    a. on Xarelto  . Smoker unmotivated to quit   . Vitamin D deficiency      Home Meds: Outpatient Medications Prior to Visit  Medication Sig Dispense Refill  . acetaminophen (TYLENOL) 500 MG tablet Take 500 mg by mouth every 6 (six) hours as needed for headache.    Marland Kitchen atorvastatin (LIPITOR) 40 MG tablet TAKE 1 TABLET(40 MG) BY MOUTH DAILY AT 6 PM 90 tablet 3  . benazepril (LOTENSIN) 20 MG tablet Take 1 tablet (20 mg total) by mouth daily. 30 tablet 2  . BREO ELLIPTA 100-25 MCG/INH AEPB INHALE 1 PUFF DAILY 1 each 3  . Cholecalciferol 4000 units CAPS Take 1 capsule (4,000 Units total) by mouth daily. 30 capsule 3  . diltiazem (CARDIZEM CD) 240 MG 24 hr capsule Take 2 capsules (480 mg total) by mouth daily. (Patient taking differently: Take 240 mg by mouth 2 (two) times daily. ) 60 capsule 2  . furosemide (LASIX) 20 MG tablet TAKE 1 TABLET(20 MG) BY MOUTH DAILY 90 tablet 1  . levalbuterol (XOPENEX) 0.63 MG/3ML nebulizer solution Take 3 mLs (0.63 mg total) by nebulization every 6 (six) hours as needed for wheezing or shortness of breath. 3 mL 12  . metoprolol tartrate (LOPRESSOR) 50 MG tablet Take 1  tablet (50 mg total) by mouth 2 (two) times daily. 180 tablet 3  . Multiple Minerals-Vitamins (CALCIUM & VIT D3 BONE HEALTH PO) Take 1 tablet by mouth daily.    Alveda Reasons 20 MG TABS tablet TAKE 1 TABLET(20 MG) BY MOUTH DAILY WITH SUPPER 90 tablet 1   No facility-administered medications prior to visit.     Allergies:  Allergies  Allergen Reactions  . Codeine Nausea And Vomiting    Severe nausea    Social History   Socioeconomic History  . Marital status: Married    Spouse name: Not on file  . Number of children: 3  . Years of education: Not on file  . Highest education level: Not on file  Social Needs  . Financial resource strain: Not on file  . Food  insecurity - worry: Not on file  . Food insecurity - inability: Not on file  . Transportation needs - medical: Not on file  . Transportation needs - non-medical: Not on file  Occupational History    Employer: [employer]  Tobacco Use  . Smoking status: Current Some Day Smoker    Packs/day: 0.20    Types: Cigarettes    Last attempt to quit: 05/17/2013    Years since quitting: 4.1  . Smokeless tobacco: Never Used  Substance and Sexual Activity  . Alcohol use: No  . Drug use: No  . Sexual activity: Not on file  Other Topics Concern  . Not on file  Social History Narrative   Entered 06/2014:   His wife was also a patient of mine ([facility])   She passed away in 2015--secondary to ongoing smoker, lung cancer.    At [place] 06/2104 pt reports that one of their kids was already living with them--so he is not alone, even now .    Family History  Problem Relation Age of Onset  . Heart disease Mother   . COPD Father   . Cancer Father   . Cancer Sister 94       Breast Cancer  . Diabetes Brother      Review of Systems:  See HPI for pertinent ROS. All other ROS negative.    Physical Exam: Blood pressure (!) 170/64, pulse (!) 124, SpO2 (!) 60 %., There is no height or weight on file to calculate BMI. General: WM. Appears ashen color and very weak.  Neck: Supple. No thyromegaly. No lymphadenopathy. Lungs: Very little air movement.  Very distant decreased breath sounds.  This is all from the anterior chest.  Cannot set him up to listen to back right now. Heart: Regular, rapid Musculoskeletal:  Strength and tone normal for age. Extremities/Skin: [place] color Neuro: Alert and oriented X 3. Moves all extremities spontaneously. Gait is normal. CNII-XII grossly in tact. Psych:  Responds to questions appropriately with a normal affect.     ASSESSMENT AND PLAN:  77 y.o. year old male with   1. Acute respiratory failure with hypoxia and hypercapnia ([facility])  2. COPD exacerbation  ([place])  3. Atrial fibrillation with rapid ventricular response ([place])  4. SOB (shortness of breath) - EKG 12-Lead  5.  Is also reporting recent hematochezia and that he is scheduled for colonoscopy.----- anemia may be contributing factor to this clinical picture. ----------I called [facility] [phone]) and informed them of patient being transferred to the hospital via EMS and fact that he is scheduled for colonoscopy with Dr. [name] on 07/21/2017.----------  He has been treated with oxygen and nebulizer treatment.  EMS is here and will transport patient to hospital.   Signed, [address] [place], [place], [facility] 07/19/2017 12:25 PM

## 2017-07-19 NOTE — ED Notes (Signed)
Request for meds verification sent to pharmacy

## 2017-07-19 NOTE — ED Notes (Signed)
Called lab to add D- Dimer

## 2017-07-19 NOTE — ED Notes (Signed)
EDP aware of patient HR.

## 2017-07-19 NOTE — ED Provider Notes (Signed)
[facility] EMERGENCY DEPARTMENT Provider Note   CSN: [id number] Arrival date & time: 07/19/17  1314     History   Chief Complaint Chief Complaint  Patient presents with  . Shortness of Breath    HPI Antonio Wise is a 76 y.o. male.  Shortness of breath with cough productive of yellow sputum onset yesterday.  Sent from primary care physician's office today by EMS.  EMS treated patient with Solu-Medrol and nebulized treatments in route.  Patient's breathing has improved.  He denies any fever.  No vomiting.  No other associated symptoms.  HPI  Past Medical History:  Diagnosis Date  . Colon polyp 05/06/2008   repeat 5 years  . COPD (chronic obstructive pulmonary disease) ([place])   . Duodenal ulcer 05/06/2008   EGD  . Elevated PSA   . Gastric ulcer 05/06/2008   EGD  . H/O Helicobacter infection 05/06/2008   EGD  . History of nuclear stress test 02/19/2010   bruce protocol; mild-mod perfusion defect in spetal region consistent with attenuation artifact; no significant ischemia demonstrated; dysrhythmia during exercise  . Hyperlipidemia   . Hypertension   . Peripheral vascular disease ([place])    a. s/p left common iliac artery stenting in 2006  . Persistent atrial fibrillation ([place])    a. on Xarelto  . Smoker unmotivated to quit   . Vitamin D deficiency     Patient Active Problem List   Diagnosis Date Noted  . Current use of long term anticoagulation 03/01/2017  . Atrial fibrillation with rapid ventricular response ([place]) 02/07/2017  . Acute respiratory failure with hypoxia and hypercapnia ([place]) 02/07/2017  . Light smoker 06/05/2015  . Screening for colorectal cancer 06/14/2013  . Hyperglycemia 06/14/2013  . Hypertension   . Hyperlipidemia LDL goal <70   . Vitamin D deficiency   . Smoker unmotivated to quit   . Peripheral vascular disease ([place])   . Paroxysmal atrial fibrillation ([facility])   . Elevated PSA   . COPD exacerbation [facility])     Past Surgical  History:  Procedure Laterality Date  . ILIAC VEIN ANGIOPLASTY / STENTING  05/19/2005   left CIA PTA & stenting   . TRANSTHORACIC ECHOCARDIOGRAM  06/10/2005   LV hyperdynamic; borderline LA enlargement; trace MR/TR/AVR       Home Medications    Prior to Admission medications   Medication Sig Start Date End Date Taking? Authorizing Provider  acetaminophen (TYLENOL) 500 MG tablet Take 500 mg by mouth every 6 (six) hours as needed for headache.    [provider]  atorvastatin (LIPITOR) 40 MG tablet TAKE 1 TABLET(40 MG) BY MOUTH DAILY AT 6 PM 03/09/17   Lorretta Harp, MD  benazepril (LOTENSIN) 20 MG tablet Take 1 tablet (20 mg total) by mouth daily. 02/12/17   Oswald Hillock, MD  BREO ELLIPTA 100-25 MCG/INH AEPB INHALE 1 PUFF DAILY 07/12/17   [name], PA-C  Cholecalciferol 4000 units CAPS Take 1 capsule (4,000 Units total) by mouth daily. 10/14/16   [name], PA-C  diltiazem (CARDIZEM CD) 240 MG 24 hr capsule Take 2 capsules (480 mg total) by mouth daily. Patient taking differently: Take 240 mg by mouth 2 (two) times daily.  06/11/17   Dena Billet B, PA-C  furosemide (LASIX) 20 MG tablet TAKE 1 TABLET(20 MG) BY MOUTH DAILY 05/20/17   Dena Billet B, PA-C  levalbuterol (XOPENEX) 0.63 MG/3ML nebulizer solution Take 3 mLs (0.63 mg total) by nebulization every 6 (six)  hours as needed for wheezing or shortness of breath. 02/11/17   Oswald Hillock, MD  metoprolol tartrate (LOPRESSOR) 50 MG tablet Take 1 tablet (50 mg total) by mouth 2 (two) times daily. 03/01/17   Strader, Fransisco Hertz, PA-C  Multiple Minerals-Vitamins (CALCIUM & VIT D3 BONE HEALTH PO) Take 1 tablet by mouth daily.    [provider]  XARELTO 20 MG TABS tablet TAKE 1 TABLET(20 MG) BY MOUTH DAILY WITH SUPPER 04/12/17   Lorretta Harp, MD    Family History Family History  Problem Relation Age of Onset  . Heart disease Mother   . COPD Father   . Cancer Father   . Cancer Sister 47       Breast Cancer  .  Diabetes Brother     Social History Social History   Tobacco Use  . Smoking status: Current Some Day Smoker    Packs/day: 0.20    Types: Cigarettes    Last attempt to quit: 05/17/2013    Years since quitting: 4.1  . Smokeless tobacco: Never Used  Substance Use Topics  . Alcohol use: No  . Drug use: No     Allergies   Codeine   Review of Systems Review of Systems  Constitutional: Negative.   HENT: Negative.   Respiratory: Positive for cough and shortness of breath.   Cardiovascular: Negative.   Gastrointestinal: Negative.   Musculoskeletal: Negative.   Skin: Negative.   Neurological: Negative.   Psychiatric/Behavioral: Negative.   All other systems reviewed and are negative.    Physical Exam Updated Vital Signs BP 140/62   Pulse (!) 144   Temp 98.3 F (36.8 C) (Oral)   Resp (!) 28   SpO2 99%   Physical Exam  Constitutional: He appears well-developed and well-nourished.  HENT:  Head: Normocephalic and atraumatic.  Eyes: Conjunctivae are normal. Pupils are equal, round, and reactive to light.  Neck: Neck supple. No tracheal deviation present. No thyromegaly present.  Cardiovascular:  No murmur heard. Tachycardic irregularly irregular  Pulmonary/Chest:  Diminished breath sounds diffusely  Abdominal: Soft. Bowel sounds are normal. He exhibits no distension. There is no tenderness.  Musculoskeletal: Normal range of motion. He exhibits no edema or tenderness.  Neurological: He is alert. Coordination normal.  Skin: Skin is warm and dry. No rash noted.  Psychiatric: He has a normal mood and affect.  Nursing note and vitals reviewed.    ED Treatments / Results  Labs (all labs ordered are listed, but only abnormal results are displayed) Labs Reviewed  CBC WITH DIFFERENTIAL/PLATELET - Abnormal; Notable for the following components:      Result Value   WBC 17.3 (*)    MCV 101.9 (*)    Platelets 443 (*)    Neutro Abs 10.9 (*)    Monocytes Absolute 2.7  (*)    All other components within normal limits  BASIC METABOLIC PANEL    EKG  EKG Interpretation  Date/Time:  Monday July 19 2017 14:27:44 EST Ventricular Rate:  145 PR Interval:    QRS Duration: 88 QT Interval:  308 QTC Calculation: 479 R Axis:   97 Text Interpretation:  Atrial fibrillation with rapid V-rate Anterior infarct, old ST depression, probably rate related No significant change since last tracing Confirmed by Wallace, Inocente Salles [phone]) on 07/19/2017 4:52:19 PM       Radiology Dg Chest Port 1 View  Result Date: 07/19/2017 CLINICAL DATA:  Cough EXAM: PORTABLE CHEST 1 VIEW COMPARISON:  February 07, 2017 FINDINGS:  Lungs are mildly hyperexpanded. There is mild scarring in the left base. There is no edema or consolidation. Heart size and pulmonary vascularity are normal. No adenopathy. There is aortic atherosclerosis. There is calcification in each carotid artery. IMPRESSION: Lungs mildly hyperexpanded with mild left base scarring. No edema or consolidation. Heart size normal. There is aortic atherosclerosis as well as calcification in both carotid arteries. Aortic Atherosclerosis (ICD10-I70.0). Electronically Signed   By: Lowella Grip III M.D.   On: 07/19/2017 13:52    Procedures Procedures (including critical care time)  Medications Ordered in ED Medications  albuterol (PROVENTIL,VENTOLIN) solution continuous neb (10 mg/hr Nebulization Given 07/19/17 1333)    ED ECG REPORT   Date: 07/19/2017  Rate: 140  Rhythm: atrial fibrillation  QRS Axis: normal  Intervals: normal  ST/T Wave abnormalities: nonspecific ST/T changes  Conduction Disutrbances:nonspecific intraventricular conduction delay  Narrative Interpretation:   Old EKG Reviewed: Faster than previous tracing Results for orders placed or performed during the hospital encounter of 07/19/17  CBC with Differential/Platelet  Result Value Ref Range   WBC 17.3 (H) 4.0 - 10.5 K/uL   RBC 4.85 4.22 - 5.81 MIL/uL    Hemoglobin 15.5 13.0 - 17.0 g/dL   HCT 49.4 39.0 - 52.0 %   MCV 101.9 (H) 78.0 - 100.0 fL   MCH 32.0 26.0 - 34.0 pg   MCHC 31.4 30.0 - 36.0 g/dL   RDW 14.6 11.5 - 15.5 %   Platelets 443 (H) 150 - 400 K/uL   Neutrophils Relative % 63 %   Neutro Abs 10.9 (H) 1.7 - 7.7 K/uL   Lymphocytes Relative 21 %   Lymphs Abs 3.6 0.7 - 4.0 K/uL   Monocytes Relative 16 %   Monocytes Absolute 2.7 (H) 0.1 - 1.0 K/uL   Eosinophils Relative 0 %   Eosinophils Absolute 0.0 0.0 - 0.7 K/uL   Basophils Relative 0 %   Basophils Absolute 0.1 0.0 - 0.1 K/uL  Basic metabolic panel  Result Value Ref Range   Sodium 141 135 - 145 mmol/L   Potassium 4.9 3.5 - 5.1 mmol/L   Chloride 100 (L) 101 - 111 mmol/L   CO2 27 22 - 32 mmol/L   Glucose, Bld 215 (H) 65 - 99 mg/dL   BUN 21 (H) 6 - 20 mg/dL   Creatinine, Ser 2.09 (H) 0.61 - 1.24 mg/dL   Calcium 9.1 8.9 - 10.3 mg/dL   GFR calc non Af Amer 30 (L) >60 mL/min   GFR calc Af Amer 34 (L) >60 mL/min   Anion gap 14 5 - 15  I-stat chem 8, ed  Result Value Ref Range   Sodium 141 135 - 145 mmol/L   Potassium 4.3 3.5 - 5.1 mmol/L   Chloride 100 (L) 101 - 111 mmol/L   BUN 25 (H) 6 - 20 mg/dL   Creatinine, Ser 1.80 (H) 0.61 - 1.24 mg/dL   Glucose, Bld 223 (H) 65 - 99 mg/dL   Calcium, Ion 1.13 (L) 1.15 - 1.40 mmol/L   TCO2 28 22 - 32 mmol/L   Hemoglobin 16.7 13.0 - 17.0 g/dL   HCT 49.0 39.0 - 52.0 %  Chest x-ray viewed by me Dg Chest Port 1 View  Result Date: 07/19/2017 CLINICAL DATA:  Cough EXAM: PORTABLE CHEST 1 VIEW COMPARISON:  February 07, 2017 FINDINGS: Lungs are mildly hyperexpanded. There is mild scarring in the left base. There is no edema or consolidation. Heart size and pulmonary vascularity are normal. No adenopathy. There is  aortic atherosclerosis. There is calcification in each carotid artery. IMPRESSION: Lungs mildly hyperexpanded with mild left base scarring. No edema or consolidation. Heart size normal. There is aortic atherosclerosis as well as  calcification in both carotid arteries. Aortic Atherosclerosis (ICD10-I70.0). Electronically Signed   By: Lowella Grip III M.D.   On: 07/19/2017 13:52   I have personally reviewed the EKG tracing and agree with the computerized printout as noted. Initial Impression / Assessment and Plan / ED Course  I have reviewed the triage vital signs and the nursing notes.  Pertinent labs & imaging results that were available during my care of the patient were reviewed by me and considered in my medical decision making (see chart for details).    2:20 PM breathing improved while on continuous nebulization with albuterol.  Patient appears to be breathing more comfortable  4:40 PM breathing continues to improve after treatment with DuoNeb.  Remains tachycardic.  Less tachycardic after treatment with intravenous Lopressor.  Additional intravenous Lopressor ordered. I consulted hospitalist service who will arrange for admission. I Counseled patient for 5 minutes on smoking cessation Final Clinical Impressions(s) / ED Diagnoses  Diagnoses #1 exacerbation of COPD #2 atrial fibrillation with rapid ventricular response #3 tobacco abuse #4renal insufficiency Final diagnoses:  None   CRITICAL CARE Performed by: Orlie Dakin Total critical care time: 40 minutes Critical care time was exclusive of separately billable procedures and treating other patients. Critical care was necessary to treat or prevent imminent or life-threatening deterioration. Critical care was time spent personally by me on the following activities: development of treatment plan with patient and/or surrogate as well as nursing, discussions with consultants, evaluation of patient's response to treatment, examination of patient, obtaining history from patient or surrogate, ordering and performing treatments and interventions, ordering and review of laboratory studies, ordering and review of radiographic studies, pulse oximetry and  re-evaluation of patient's condition. ED Discharge Orders    None       Orlie Dakin, MD 07/19/17 1654

## 2017-07-19 NOTE — ED Notes (Signed)
Contacted the pharmacy re pt's xarelto.  Apolonio Schneiders states the dose needed to be verified with the ordering provider and will send as soon as this is done

## 2017-07-19 NOTE — ED Notes (Signed)
Pt care assumed.  Pt's breathing is labored.  He reports feeling better.  Family at bedside.  Pt is A&Ox 4.  He denies cp or dizziness.

## 2017-07-19 NOTE — ED Triage Notes (Signed)
Patient presents to the ED coming from PCP with complaints of Shortness of breathe,. Per EMS patient had wheezing throughout. Patient given 15mg  albuterol with EMS 1mg  Atrovent .3 IM of Epi. . EMS vitals: 152/86 HR 118 100% RR28. Patient on treatment on arrival. Patient able to follow commands. Per EMS patient RA Sats at PCP 60's.

## 2017-07-19 NOTE — ED Notes (Signed)
Called Lab about BMP advised just resent.

## 2017-07-19 NOTE — H&P (Signed)
History and Physical    Antonio Wise WER:[id number] DOB: 1941-01-23 DOA: 07/19/2017  PCP: [name], PA-C Patient coming from: home  Chief Complaint:  sob  HPI: Antonio Wise is a pleasant 77 y.o. male with medical history significant COPD not on home oxygen, A. fib, smoker, hypertension hyperlipidemia, hyperglycemia, presents to the emergency Department chief complaint of persistent worsening shortness of breath. EMS reports oxygen saturation level 60% on room air. Initial evaluation reveals A. fib with RVR as well. [facility] are asked to admit.  Information is obtained from the patient and the chart. He states for the last 3 days is felt more short of breath increased wheezing increased sputum production and coughing. He states he used his home inhaler and nebulizer without relief. He denies fever chills headache dizziness syncope or near-syncope. He denies chest pain palpitations lower extremity edema or orthopnea. Denies abdominal pain nausea vomiting diarrhea constipation melena or bright red blood per rectum. He denies dysuria hematuria frequency or urgency. He does state he scheduled for colonoscopy in 2 days and is supposed to start the prep tomorrow. I informed him that we'll likely need to be rescheduled    ED Course: In the emergency department he's afebrile heart rate of 140, respiratory rate 28. He's provided with Solu-Medrol nebulizers metoprolol IV 2. At the time of admission he reports he's breathing easier. His heart rate is 110 respiratory rate 23.  Review of Systems: As per HPI otherwise all other systems reviewed and are negative.   Ambulatory Status: Ambulates independently with a steady gait no recent falls  Past Medical History:  Diagnosis Date  . Colon polyp 05/06/2008   repeat 5 years  . COPD (chronic obstructive pulmonary disease) ([place])   . Duodenal ulcer 05/06/2008   EGD  . Elevated PSA   . Gastric ulcer 05/06/2008   EGD  . H/O Helicobacter  infection 05/06/2008   EGD  . History of nuclear stress test 02/19/2010   bruce protocol; mild-mod perfusion defect in spetal region consistent with attenuation artifact; no significant ischemia demonstrated; dysrhythmia during exercise  . Hyperlipidemia   . Hypertension   . Peripheral vascular disease ([place])    a. s/p left common iliac artery stenting in 2006  . Persistent atrial fibrillation ([place])    a. on Xarelto  . Smoker unmotivated to quit   . Vitamin D deficiency     Past Surgical History:  Procedure Laterality Date  . ILIAC VEIN ANGIOPLASTY / STENTING  05/19/2005   left CIA PTA & stenting   . TRANSTHORACIC ECHOCARDIOGRAM  06/10/2005   LV hyperdynamic; borderline LA enlargement; trace MR/TR/AVR    Social History   Socioeconomic History  . Marital status: Married    Spouse name: Not on file  . Number of children: 3  . Years of education: Not on file  . Highest education level: Not on file  Social Needs  . Financial resource strain: Not on file  . Food insecurity - worry: Not on file  . Food insecurity - inability: Not on file  . Transportation needs - medical: Not on file  . Transportation needs - non-medical: Not on file  Occupational History    Employer: [employer]  Tobacco Use  . Smoking status: Current Some Day Smoker    Packs/day: 0.20    Types: Cigarettes    Last attempt to quit: 05/17/2013    Years since quitting: 4.1  . Smokeless tobacco: Never Used  Substance and Sexual Activity  .  Alcohol use: No  . Drug use: No  . Sexual activity: Not on file  Other Topics Concern  . Not on file  Social History Narrative   Entered 06/2014:   His wife was also a patient of mine ([facility])   She passed away in 2015--secondary to ongoing smoker, lung cancer.    At [place] 06/2104 pt reports that one of their kids was already living with them--so he is not alone, even now .    Allergies  Allergen Reactions  . Codeine Nausea And Vomiting    Severe nausea    Family  History  Problem Relation Age of Onset  . Heart disease Mother   . COPD Father   . Cancer Father   . Cancer Sister 76       Breast Cancer  . Diabetes Brother     Prior to Admission medications   Medication Sig Start Date End Date Taking? Authorizing Provider  acetaminophen (TYLENOL) 500 MG tablet Take 500 mg by mouth every 6 (six) hours as needed for headache.   Yes [provider]  albuterol (PROVENTIL HFA;VENTOLIN HFA) 108 (90 Base) MCG/ACT inhaler Inhale 1 puff into the lungs every 6 (six) hours as needed for wheezing or shortness of breath.   Yes [provider]  atorvastatin (LIPITOR) 40 MG tablet TAKE 1 TABLET(40 MG) BY MOUTH DAILY AT 6 PM 03/09/17  Yes Lorretta Harp, MD  benazepril (LOTENSIN) 20 MG tablet Take 1 tablet (20 mg total) by mouth daily. 02/12/17  Yes Lama, Marge Duncans, MD  BREO ELLIPTA 100-25 MCG/INH AEPB INHALE 1 PUFF DAILY Patient taking differently: INHALE 1 PUFF BY INHALATION ONCE DAILY 07/12/17  Yes [name], PA-C  Cholecalciferol 4000 units CAPS Take 1 capsule (4,000 Units total) by mouth daily. 10/14/16  Yes [name], PA-C  diltiazem (CARDIZEM CD) 240 MG 24 hr capsule Take 2 capsules (480 mg total) by mouth daily. Patient taking differently: Take 240 mg by mouth 2 (two) times daily.  06/11/17  Yes Dena Billet B, PA-C  levalbuterol (XOPENEX) 0.63 MG/3ML nebulizer solution Take 3 mLs (0.63 mg total) by nebulization every 6 (six) hours as needed for wheezing or shortness of breath. 02/11/17  Yes Oswald Hillock, MD  metoprolol tartrate (LOPRESSOR) 50 MG tablet Take 1 tablet (50 mg total) by mouth 2 (two) times daily. 03/01/17  Yes Strader, Fransisco Hertz, PA-C  XARELTO 20 MG TABS tablet TAKE 1 TABLET(20 MG) BY MOUTH DAILY WITH SUPPER 04/12/17  Yes Lorretta Harp, MD  furosemide (LASIX) 20 MG tablet TAKE 1 TABLET(20 MG) BY MOUTH DAILY Patient not taking: Reported on 07/19/2017 05/20/17   [name], PA-C    Physical Exam: Vitals:   07/19/17 1530  07/19/17 1600 07/19/17 1615 07/19/17 1645  BP: 99/68  (!) 111/58   Pulse: (!) 120 (!) 28 (!) 128 (!) 116  Resp: (!) 22 (!) 21 (!) 22 (!) 23  Temp:      TempSrc:      SpO2: 93% 92% 93% 92%     General:  Appears slightly anxious but not uncomfortable sitting straight up in bed Eyes:  PERRL, EOMI, normal lids, iris ENT:  grossly normal hearing, lips & tongue, mucous membranes of his mouth are pink slightly dry Neck:  no LAD, masses or thyromegaly Cardiovascular:  Irregularly irregular, no m/r/g. No LE edema.  Respiratory:  Moderate increased work of breathing with conversation. Rest sounds are quite diminished throughout. I hear no rhonchi no  wheezes no crackles Abdomen:  soft, ntnd, positive bowel sounds throughout no guarding or rebounding Skin:  no rash or induration seen on limited exam Musculoskeletal:  grossly normal tone BUE/BLE, good ROM, no bony abnormality Psychiatric:  grossly normal mood and affect, speech fluent and appropriate, AOx3 Neurologic:  CN 2-12 grossly intact, moves all extremities in coordinated fashion, sensation intact  Labs on Admission: I have personally reviewed following labs and imaging studies  CBC: Recent Labs  Lab 07/19/17 1341 07/19/17 1615  WBC 17.3*  --   NEUTROABS 10.9*  --   HGB 15.5 16.7  HCT 49.4 49.0  MCV 101.9*  --   PLT 443*  --    Basic Metabolic Panel: Recent Labs  Lab 07/19/17 1554 07/19/17 1615  NA 141 141  K 4.9 4.3  CL 100* 100*  CO2 27  --   GLUCOSE 215* 223*  BUN 21* 25*  CREATININE 2.09* 1.80*  CALCIUM 9.1  --    GFR: CrCl cannot be calculated (Unknown ideal weight.). Liver Function Tests: No results for input(s): AST, ALT, ALKPHOS, BILITOT, PROT, ALBUMIN in the last 168 hours. No results for input(s): LIPASE, AMYLASE in the last 168 hours. No results for input(s): AMMONIA in the last 168 hours. Coagulation Profile: No results for input(s): INR, PROTIME in the last 168 hours. Cardiac Enzymes: No results for  input(s): CKTOTAL, CKMB, CKMBINDEX, TROPONINI in the last 168 hours. BNP (last 3 results) No results for input(s): PROBNP in the last 8760 hours. HbA1C: No results for input(s): HGBA1C in the last 72 hours. CBG: No results for input(s): GLUCAP in the last 168 hours. Lipid Profile: No results for input(s): CHOL, HDL, LDLCALC, TRIG, CHOLHDL, LDLDIRECT in the last 72 hours. Thyroid Function Tests: No results for input(s): TSH, T4TOTAL, FREET4, T3FREE, THYROIDAB in the last 72 hours. Anemia Panel: No results for input(s): VITAMINB12, FOLATE, FERRITIN, TIBC, IRON, RETICCTPCT in the last 72 hours. Urine analysis:    Component Value Date/Time   COLORURINE YELLOW 10/11/2015 1212   APPEARANCEUR CLEAR 10/11/2015 1212   LABSPEC 1.020 10/11/2015 1212   PHURINE 5.5 10/11/2015 1212   GLUCOSEU NEGATIVE 10/11/2015 1212   HGBUR NEGATIVE 10/11/2015 1212   BILIRUBINUR NEGATIVE 10/11/2015 1212   KETONESUR NEGATIVE 10/11/2015 1212   PROTEINUR TRACE (A) 10/11/2015 1212   NITRITE NEGATIVE 10/11/2015 1212   LEUKOCYTESUR NEGATIVE 10/11/2015 1212    Creatinine Clearance: CrCl cannot be calculated (Unknown ideal weight.).  Sepsis Labs: @LABRCNTIP (procalcitonin:4,lacticidven:4) )No results found for this or any previous visit (from the past 240 hour(s)).   Radiological Exams on Admission: Dg Chest Port 1 View  Result Date: 07/19/2017 CLINICAL DATA:  Cough EXAM: PORTABLE CHEST 1 VIEW COMPARISON:  February 07, 2017 FINDINGS: Lungs are mildly hyperexpanded. There is mild scarring in the left base. There is no edema or consolidation. Heart size and pulmonary vascularity are normal. No adenopathy. There is aortic atherosclerosis. There is calcification in each carotid artery. IMPRESSION: Lungs mildly hyperexpanded with mild left base scarring. No edema or consolidation. Heart size normal. There is aortic atherosclerosis as well as calcification in both carotid arteries. Aortic Atherosclerosis (ICD10-I70.0).  Electronically Signed   By: Lowella Grip III M.D.   On: 07/19/2017 13:52    EKG: atrial fibrillation with rapid V-rate Anterior infarct, old ST depression, probably rate related No significant change since last tracing  Assessment/Plan Principal Problem:   Acute respiratory failure (HCC) Active Problems:   Hypertension   Hyperlipidemia LDL goal <70   COPD exacerbation ([place])  Hyperglycemia   Atrial fibrillation with rapid ventricular response ([place])   Acute kidney injury ([place])   #1. Acute respiratory failure with hypoxia likely related to a COPD exacerbation setting of A. fib with RVR. Patient was seen by orthopedics today documented oxygen saturation level 60% on room air in the office. EMS was called his bladder with nebulizer Solu-Medrol and oxygen supplementation. Chest x-ray reveals lungs mildly hyperexpanded with mild left base scarring no edema or consolidation. -Admit to telemetry -Scheduled nebulizers -Continue Solu-Medrol -Continue oxygen supplementation -Monitor oxygen saturation level -Wean oxygen as able -Cycle troponin -Consider flutter valve  #2. Atrial fibrillation with rapid ventricular response. Likely related to above medications include Cardizem and metoprolol. EKG as noted above.  medications also include xarelto. He received 5 mg of metoprolol IV all in the emergency department 2. At the time of admission heart rate 110 -Resume home Cardizem -Resume home metoprolol -Continue Xarelto -Cycle troponin  #3. COPD with exacerbation. Patient is not on home oxygen. He does have nebulizers and inhalers. Chest x-ray as noted above -See #1 -Provide Levaquin as well -Steroids -Scheduled nebs  #4. Acute kidney injury. Creatinine 1.8 -Gentle IV fluids -Hold nephrotoxins -Monitor urine output -Recheck in the morning  #5. Hyperglycemia. Patient denies history of diabetes. Has been on steroids intermittently. Serum glucose 223 on admission -Obtain a hemoglobin  A1c -Sliding scale for optimal control  #6. Hypertension. Fair control in the emergency department. Home medications include benazepril, diltiazem, metoprolol, and Lasix. -Hold Lasix and benazepril for now -Continue home diltiazem and metoprolol -Monitor closely -resume other home meds as indicated   DVT prophylaxis: xaerlto  Code Status: full  Family Communication: none present  Disposition Plan: home  Consults called: none  Admission status: inpatient    Radene Gunning MD [facility]  If 7PM-7AM, please contact night-coverage [email or web address] Password [facility]  07/19/2017, 5:21 PM

## 2017-07-20 ENCOUNTER — Other Ambulatory Visit: Payer: Self-pay

## 2017-07-20 DIAGNOSIS — J9601 Acute respiratory failure with hypoxia: Secondary | ICD-10-CM

## 2017-07-20 LAB — BASIC METABOLIC PANEL
Anion gap: 12 (ref 5–15)
Anion gap: 11 (ref 5–15)
BUN: 31 mg/dL — ABNORMAL HIGH (ref 6–20)
BUN: 36 mg/dL — ABNORMAL HIGH (ref 6–20)
CALCIUM: 8.9 mg/dL (ref 8.9–10.3)
CALCIUM: 9.2 mg/dL (ref 8.9–10.3)
CO2: 27 mmol/L (ref 22–32)
CO2: 26 mmol/L (ref 22–32)
CREATININE: 2.05 mg/dL — AB (ref 0.61–1.24)
Chloride: 100 mmol/L — ABNORMAL LOW (ref 101–111)
Chloride: 100 mmol/L — ABNORMAL LOW (ref 101–111)
Creatinine, Ser: 1.81 mg/dL — ABNORMAL HIGH (ref 0.61–1.24)
GFR calc Af Amer: 41 mL/min — ABNORMAL LOW (ref >60)
GFR, EST AFRICAN AMERICAN: 35 mL/min — AB (ref >60)
GFR, EST NON AFRICAN AMERICAN: 30 mL/min — AB (ref >60)
GFR, EST NON AFRICAN AMERICAN: 35 mL/min — AB (ref >60)
Glucose, Bld: 199 mg/dL — ABNORMAL HIGH (ref 65–99)
Glucose, Bld: 159 mg/dL — ABNORMAL HIGH (ref 65–99)
POTASSIUM: 5.2 mmol/L — AB (ref 3.5–5.1)
Potassium: 4.8 mmol/L (ref 3.5–5.1)
SODIUM: 139 mmol/L (ref 135–145)
SODIUM: 137 mmol/L (ref 135–145)

## 2017-07-20 LAB — RESPIRATORY PANEL BY PCR
Adenovirus: NOT DETECTED
Bordetella pertussis: NOT DETECTED
CHLAMYDOPHILA PNEUMONIAE-RVPPCR: NOT DETECTED
CORONAVIRUS 229E-RVPPCR: NOT DETECTED
CORONAVIRUS HKU1-RVPPCR: NOT DETECTED
Coronavirus NL63: NOT DETECTED
Coronavirus OC43: NOT DETECTED
Influenza A: NOT DETECTED
Influenza B: NOT DETECTED
MYCOPLASMA PNEUMONIAE-RVPPCR: NOT DETECTED
Metapneumovirus: NOT DETECTED
Parainfluenza Virus 1: NOT DETECTED
Parainfluenza Virus 2: NOT DETECTED
Parainfluenza Virus 3: NOT DETECTED
Parainfluenza Virus 4: NOT DETECTED
RESPIRATORY SYNCYTIAL VIRUS-RVPPCR: NOT DETECTED
Rhinovirus / Enterovirus: NOT DETECTED

## 2017-07-20 LAB — TROPONIN I

## 2017-07-20 LAB — GLUCOSE, CAPILLARY
GLUCOSE-CAPILLARY: 125 mg/dL — AB (ref 65–99)
GLUCOSE-CAPILLARY: 171 mg/dL — AB (ref 65–99)

## 2017-07-20 LAB — CBC
HCT: 45 % (ref 39.0–52.0)
Hemoglobin: 13.8 g/dL (ref 13.0–17.0)
MCH: 31.7 pg (ref 26.0–34.0)
MCHC: 30.7 g/dL (ref 30.0–36.0)
MCV: 103.2 fL — ABNORMAL HIGH (ref 78.0–100.0)
PLATELETS: 305 10*3/uL (ref 150–400)
RBC: 4.36 MIL/uL (ref 4.22–5.81)
RDW: 14.5 % (ref 11.5–15.5)
WBC: 12.1 10*3/uL — AB (ref 4.0–10.5)

## 2017-07-20 LAB — CBG MONITORING, ED
GLUCOSE-CAPILLARY: 217 mg/dL — AB (ref 65–99)
GLUCOSE-CAPILLARY: 155 mg/dL — AB (ref 65–99)

## 2017-07-20 MED ORDER — LEVALBUTEROL HCL 0.63 MG/3ML IN NEBU
0.6300 mg | INHALATION_SOLUTION | Freq: Four times a day (QID) | RESPIRATORY_TRACT | Status: DC
  Administered 2017-07-20 – 2017-07-22 (×8): 0.63 mg via RESPIRATORY_TRACT
  Filled 2017-07-20 (×8): qty 3

## 2017-07-20 MED ORDER — FAMOTIDINE 20 MG PO TABS
20.0000 mg | ORAL_TABLET | Freq: Two times a day (BID) | ORAL | Status: DC | PRN
  Administered 2017-07-20 – 2017-07-22 (×3): 20 mg via ORAL
  Filled 2017-07-20 (×3): qty 1

## 2017-07-20 MED ORDER — SODIUM CHLORIDE 0.9 % IV BOLUS (SEPSIS)
500.0000 mL | Freq: Once | INTRAVENOUS | Status: AC
  Administered 2017-07-20: 500 mL via INTRAVENOUS

## 2017-07-20 MED ORDER — SODIUM CHLORIDE 0.9 % IV SOLN
INTRAVENOUS | Status: AC
  Administered 2017-07-20: 17:00:00 via INTRAVENOUS

## 2017-07-20 MED ORDER — LEVOFLOXACIN 500 MG PO TABS
250.0000 mg | ORAL_TABLET | Freq: Every day | ORAL | Status: DC
  Administered 2017-07-21: 250 mg via ORAL
  Filled 2017-07-20: qty 1

## 2017-07-20 NOTE — ED Notes (Signed)
CBG 155 

## 2017-07-20 NOTE — Progress Notes (Signed)
Responded to spiritual Care Consult to support patient.  Upon visiting with patient pt.said he did not ask for prayer and did not need chaplain services at this time.  Chaplain available as needed.  Jaclynn Major, [place], [facility], Pager [phone]

## 2017-07-20 NOTE — Progress Notes (Signed)
PROGRESS NOTE    Antonio Wise  SWN:[id number] DOB: Jan 23, 1941 DOA: 07/19/2017 PCP: [name], PA-C   Brief Narrative: Antonio Wise is a 77 y.o. male with medical history significant COPD not on home oxygen, A. fib, smoker, hypertension hyperlipidemia, GI bleed. He presented with dyspnea and found to have an acute COPD exacerbation. He is currently on steroids, Levaquin and Xopenex for treatment. Respiratory viral panel obtained and pending.   Assessment & Plan:   Principal Problem:   Acute respiratory failure (HCC) Active Problems:   Hypertension   Hyperlipidemia LDL goal <70   COPD exacerbation (HCC)   Hyperglycemia   Atrial fibrillation with rapid ventricular response (HCC)   Acute kidney injury ([place])   Acute respiratory failure COPD exacerbation Improved from overnight. -Continue Xopenex -Continue steroids, Levaquin -RVP pending/droplet precaution -Wean O2 to room air -Ambulatory O2  Atrial fibrillation with RVR Resolved overnight. Dehydration possibly contributing to tachycardia. -Continue Cardizem and metoprolol -Continue Xarelto (was held initially for upcoming colonoscopy on 1/16 which has now been canceled).  Acute kidney injury on CKD 3 Baseline of 1.2. Up to 2.09 on admission. Appears secondary to hypovolemia in the setting of diuretic use. Blood pressure has been well maintained but patient initially with tachycardia. -Continue IV fluids -AM BMP  Hyperlipidemia -Continue Lipitor  Hyperglycemia Hemoglobin A1C of 5.6%  Essential hypertension Controlled. -Continue metoprolol and Cardizem -Hold Lasix and benazepril  History of GI bleed Patient scheduled for colonoscopy on 1/16 which has now been cancelled. Hemoglobin stable.   DVT prophylaxis: Xarelto Code Status: Full code Family Communication: Daughters at bedside Disposition Plan: Discharge in 24 hours home if stable   Consultants:   None  Procedures:    None  Antimicrobials:  Levaquin (1/14>>    Subjective: Dyspnea improved overnight.  Objective: Vitals:   07/20/17 0630 07/20/17 0700 07/20/17 0800 07/20/17 0833  BP:  113/68 119/70   Pulse: 90 99 (!) 109   Resp: 20 (!) 21 20   Temp:      TempSrc:      SpO2: 94% 95% 94% 94%  Weight:      Height:       No intake or output data in the 24 hours ending 07/20/17 0846 Filed Weights   07/19/17 1700  Weight: 92.3 kg (203 lb 7.8 oz)    Examination:  General exam: Appears calm and comfortable Respiratory system: Diminished breath sounds bilaterally with mild wheezes and prolonged expiratory phase. Normal respiratory effort with clear complete sentences spoken. Cardiovascular system: S1 & S2 heard, RRR. No murmurs. Gastrointestinal system: Abdomen is nondistended, soft and nontender. No organomegaly or masses felt. Normal bowel sounds heard. Central nervous system: Alert and oriented. No focal neurological deficits. Extremities: No edema. No calf tenderness Skin: No cyanosis. No rashes Psychiatry: Judgement and insight appear normal. Mood & affect appropriate.     Data Reviewed: I have personally reviewed following labs and imaging studies  CBC: Recent Labs  Lab 07/19/17 1341 07/19/17 1615 07/20/17 0325  WBC 17.3*  --  12.1*  NEUTROABS 10.9*  --   --   HGB 15.5 16.7 13.8  HCT 49.4 49.0 45.0  MCV 101.9*  --  103.2*  PLT 443*  --  938   Basic Metabolic Panel: Recent Labs  Lab 07/19/17 1554 07/19/17 1615 07/20/17 0325  NA 141 141 139  K 4.9 4.3 4.8  CL 100* 100* 100*  CO2 27  --  27  GLUCOSE 215* 223* 199*  BUN 21*  25* 31*  CREATININE 2.09* 1.80* 2.05*  CALCIUM 9.1  --  8.9   GFR: Estimated Creatinine Clearance: 35.7 mL/min (A) (by C-G formula based on SCr of 2.05 mg/dL (H)). Liver Function Tests: No results for input(s): AST, ALT, ALKPHOS, BILITOT, PROT, ALBUMIN in the last 168 hours. No results for input(s): LIPASE, AMYLASE in the last 168 hours. No  results for input(s): AMMONIA in the last 168 hours. Coagulation Profile: No results for input(s): INR, PROTIME in the last 168 hours. Cardiac Enzymes: Recent Labs  Lab 07/19/17 1717 07/20/17 0026 07/20/17 0325  TROPONINI <0.03 <0.03 <0.03   BNP (last 3 results) No results for input(s): PROBNP in the last 8760 hours. HbA1C: Recent Labs    07/19/17 1717  HGBA1C 5.6   CBG: Recent Labs  Lab 07/19/17 1851 07/19/17 2245 07/20/17 0809  GLUCAP 227* 237* 217*   Lipid Profile: No results for input(s): CHOL, HDL, LDLCALC, TRIG, CHOLHDL, LDLDIRECT in the last 72 hours. Thyroid Function Tests: Recent Labs    07/19/17 1717  TSH 0.711   Anemia Panel: No results for input(s): VITAMINB12, FOLATE, FERRITIN, TIBC, IRON, RETICCTPCT in the last 72 hours. Sepsis Labs: No results for input(s): PROCALCITON, LATICACIDVEN in the last 168 hours.  No results found for this or any previous visit (from the past 240 hour(s)).       Radiology Studies: Dg Chest Port 1 View  Result Date: 07/19/2017 CLINICAL DATA:  Cough EXAM: PORTABLE CHEST 1 VIEW COMPARISON:  February 07, 2017 FINDINGS: Lungs are mildly hyperexpanded. There is mild scarring in the left base. There is no edema or consolidation. Heart size and pulmonary vascularity are normal. No adenopathy. There is aortic atherosclerosis. There is calcification in each carotid artery. IMPRESSION: Lungs mildly hyperexpanded with mild left base scarring. No edema or consolidation. Heart size normal. There is aortic atherosclerosis as well as calcification in both carotid arteries. Aortic Atherosclerosis (ICD10-I70.0). Electronically Signed   By: Lowella Grip III M.D.   On: 07/19/2017 13:52        Scheduled Meds: . atorvastatin  40 mg Oral q1800  . cholecalciferol  4,000 Units Oral Daily  . diltiazem  240 mg Oral BID  . fluticasone furoate-vilanterol  1 puff Inhalation Daily  . guaiFENesin  600 mg Oral BID  . insulin aspart  0-15 Units  Subcutaneous TID WC  . insulin aspart  0-5 Units Subcutaneous QHS  . levalbuterol  0.63 mg Nebulization Q6H  . levofloxacin  500 mg Oral Daily  . methylPREDNISolone (SOLU-MEDROL) injection  80 mg Intravenous Q12H  . metoprolol tartrate  50 mg Oral BID  . Rivaroxaban  15 mg Oral Daily   Continuous Infusions:   LOS: 1 day     [name], MD [facility] 07/20/2017, 8:46 AM Pager: [phone]  If 7PM-7AM, please contact night-coverage [email or web address] Password [facility] 07/20/2017, 8:46 AM

## 2017-07-20 NOTE — Telephone Encounter (Signed)
Faxed to [facility] via [facility]

## 2017-07-20 NOTE — ED Notes (Signed)
At rest, SpO2 87-89% on room air. With minimal exertion, SpO2 decreases to 78-82% and patient reports increased shortness of breath. Placed on 2L [place].

## 2017-07-20 NOTE — ED Notes (Signed)
Pt's oxygen saturation dropped to 74% on room air while ambulating; pt short of breath upon returning to room; pt placed back to 2L O2 via [place]; sats now in the lower 90's; RN notified

## 2017-07-21 ENCOUNTER — Ambulatory Visit (HOSPITAL_COMMUNITY): Admission: RE | Admit: 2017-07-21 | Payer: PPO | Source: Ambulatory Visit | Admitting: Gastroenterology

## 2017-07-21 ENCOUNTER — Encounter (HOSPITAL_COMMUNITY): Admission: RE | Payer: Self-pay | Source: Ambulatory Visit

## 2017-07-21 DIAGNOSIS — E875 Hyperkalemia: Secondary | ICD-10-CM

## 2017-07-21 LAB — GLUCOSE, CAPILLARY
GLUCOSE-CAPILLARY: 206 mg/dL — AB (ref 65–99)
GLUCOSE-CAPILLARY: 163 mg/dL — AB (ref 65–99)
Glucose-Capillary: 153 mg/dL — ABNORMAL HIGH (ref 65–99)
Glucose-Capillary: 182 mg/dL — ABNORMAL HIGH (ref 65–99)

## 2017-07-21 LAB — BASIC METABOLIC PANEL
Anion gap: 8 (ref 5–15)
BUN: 32 mg/dL — AB (ref 6–20)
CHLORIDE: 103 mmol/L (ref 101–111)
CO2: 27 mmol/L (ref 22–32)
Calcium: 8.8 mg/dL — ABNORMAL LOW (ref 8.9–10.3)
Creatinine, Ser: 1.36 mg/dL — ABNORMAL HIGH (ref 0.61–1.24)
GFR calc Af Amer: 58 mL/min — ABNORMAL LOW (ref >60)
GFR, EST NON AFRICAN AMERICAN: 50 mL/min — AB (ref >60)
GLUCOSE: 173 mg/dL — AB (ref 65–99)
POTASSIUM: 5.3 mmol/L — AB (ref 3.5–5.1)
Sodium: 138 mmol/L (ref 135–145)

## 2017-07-21 SURGERY — COLONOSCOPY WITH PROPOFOL
Anesthesia: Monitor Anesthesia Care

## 2017-07-21 MED ORDER — SODIUM POLYSTYRENE SULFONATE PO POWD
15.0000 g | Freq: Once | ORAL | Status: AC
  Administered 2017-07-21: 15 g via ORAL
  Filled 2017-07-21: qty 15

## 2017-07-21 MED ORDER — IPRATROPIUM BROMIDE 0.02 % IN SOLN
0.5000 mg | Freq: Four times a day (QID) | RESPIRATORY_TRACT | Status: DC
  Administered 2017-07-21 – 2017-07-22 (×7): 0.5 mg via RESPIRATORY_TRACT
  Filled 2017-07-21 (×7): qty 2.5

## 2017-07-21 MED ORDER — SODIUM POLYSTYRENE SULFONATE 15 GM/60ML PO SUSP
15.0000 g | Freq: Once | ORAL | Status: DC
  Filled 2017-07-21: qty 60

## 2017-07-21 MED ORDER — NICOTINE 21 MG/24HR TD PT24
21.0000 mg | MEDICATED_PATCH | Freq: Every day | TRANSDERMAL | Status: DC
  Administered 2017-07-21 – 2017-07-23 (×3): 21 mg via TRANSDERMAL
  Filled 2017-07-21 (×3): qty 1

## 2017-07-21 MED ORDER — DILTIAZEM HCL 25 MG/5ML IV SOLN
10.0000 mg | Freq: Four times a day (QID) | INTRAVENOUS | Status: DC | PRN
  Filled 2017-07-21: qty 5

## 2017-07-21 MED ORDER — METOPROLOL TARTRATE 50 MG PO TABS
75.0000 mg | ORAL_TABLET | Freq: Two times a day (BID) | ORAL | Status: DC
  Administered 2017-07-21 – 2017-07-23 (×5): 75 mg via ORAL
  Filled 2017-07-21 (×5): qty 1

## 2017-07-21 MED ORDER — RIVAROXABAN 20 MG PO TABS
20.0000 mg | ORAL_TABLET | Freq: Every day | ORAL | Status: DC
  Administered 2017-07-22: 20 mg via ORAL
  Filled 2017-07-21: qty 1

## 2017-07-21 MED ORDER — LEVOFLOXACIN 500 MG PO TABS
500.0000 mg | ORAL_TABLET | Freq: Every day | ORAL | Status: DC
  Administered 2017-07-22: 500 mg via ORAL
  Filled 2017-07-21: qty 1

## 2017-07-21 NOTE — Progress Notes (Signed)
PROGRESS NOTE                                                                                                                                                                                                             Patient Demographics:    Antonio Wise, is a 77 y.o. male, DOB - 1940/07/21, ONG:[id number]  Admit date - 07/19/2017   Admitting Physician Karmen Bongo, MD  Outpatient Primary MD for the patient is [name]  LOS - 2  Outpatient Specialists NONE  Chief Complaint  Patient presents with  . Shortness of Breath       Brief Narrative  77 year old male with history of COPD not on home O2, ongoing tobacco use, A. fib on Xarelto, hypertension, hyperlipidemia and history of GI bleed presented with COPD exacerbation and acute respiratory failure with hypoxia.    Subjective:    Patient seen and examined this morning. Reports his breathing to be better since admission. Still has some cough with whitish phlegm.   Assessment  & Plan :    Principal Problem:   Acute respiratory failure with hypoxia (HCC)   COPD exacerbation (HCC) Symptoms do not seem much change from yesterday. Continue O2 via nasal cannula (still requiring 2-3 L). Continue IV Solu-Medrol, scheduled Xopenex , and Atrovent, empiric Levaquin and antitussives. Added nicotine patch. Counseled strongly on smoking cessation. Will assess for home O2 needs prior to discharge.  Active Problems:    Hyperglycemia   Atrial fibrillation with rapid ventricular response (HCC) Likely triggered by COPD exacerbation. Heart rate in 120s on the monitor. increase metoprolol dose. Is on quite high dose of Cardizem at home which is continued. Albuterol switch to Xopenex for elevated heart rate. Continue Xarelto.    Acute kidney injury ([place]) Improving with hydration. Avoid nephrotoxins.  Essential hypertension Stable. Continue home  medications  Hyperlipidemia Continue statin  Hyperkalemia Will order a dose of Kayexalate. Monitoring a.m.   Code Status : Full code  Family Communication  : Daughter is at bedside  Disposition Plan  : Home Possibly in the next 2 days if improved  Barriers For Discharge : Active symptoms  Consults  : None  Procedures  : None  DVT Prophylaxis  :  Xarelto  Lab Results  Component Value Date   PLT 305 07/20/2017    Antibiotics  :  Anti-infectives (From admission, onward)   Start     Dose/Rate Route Frequency Ordered Stop   07/21/17 1000  levofloxacin (LEVAQUIN) tablet 250 mg     250 mg Oral Daily 07/20/17 1630     07/19/17 1730  levofloxacin (LEVAQUIN) tablet 500 mg  Status:  Discontinued     500 mg Oral Daily 07/19/17 1723 07/20/17 1630        Objective:   Vitals:   07/20/17 2025 07/21/17 0020 07/21/17 0152 07/21/17 0601  BP: 134/75 117/71  121/73  Pulse: (!) 101 88  91  Resp: 18 20  18   Temp: 98.3 F (36.8 C) 98.5 F (36.9 C)  98.8 F (37.1 C)  TempSrc: Oral Oral  Oral  SpO2: 93% 95% 92% 94%  Weight:    90.5 kg (199 lb 8 oz)  Height:        Wt Readings from Last 3 Encounters:  07/21/17 90.5 kg (199 lb 8 oz)  05/20/17 92.3 kg (203 lb 6.4 oz)  03/01/17 88 kg (194 lb)     Intake/Output Summary (Last 24 hours) at 07/21/2017 0928 Last data filed at 07/21/2017 0924 Gross per 24 hour  Intake 1435 ml  Output 700 ml  Net 735 ml     Physical Exam  Gen: not in distress, appears fatigued, able to give full sentences HEENT: no pallor, moist mucosa, supple neck Chest: Diminished breath sounds bilaterally with diffuse expiratory wheezing CVS: S1 and S2 irregularly irregular, no murmurs rub or gallop GI: soft, NT, ND,  Musculoskeletal: warm, no edema     Data Review:    CBC Recent Labs  Lab 07/19/17 1341 07/19/17 1615 07/20/17 0325  WBC 17.3*  --  12.1*  HGB 15.5 16.7 13.8  HCT 49.4 49.0 45.0  PLT 443*  --  305  MCV 101.9*  --  103.2*  MCH  32.0  --  31.7  MCHC 31.4  --  30.7  RDW 14.6  --  14.5  LYMPHSABS 3.6  --   --   MONOABS 2.7*  --   --   EOSABS 0.0  --   --   BASOSABS 0.1  --   --     Chemistries  Recent Labs  Lab 07/19/17 1554 07/19/17 1615 07/20/17 0325 07/20/17 1428 07/21/17 0631  NA 141 141 139 137 138  K 4.9 4.3 4.8 5.2* 5.3*  CL 100* 100* 100* 100* 103  CO2 27  --  27 26 27   GLUCOSE 215* 223* 199* 159* 173*  BUN 21* 25* 31* 36* 32*  CREATININE 2.09* 1.80* 2.05* 1.81* 1.36*  CALCIUM 9.1  --  8.9 9.2 8.8*   ------------------------------------------------------------------------------------------------------------------ No results for input(s): CHOL, HDL, LDLCALC, TRIG, CHOLHDL, LDLDIRECT in the last 72 hours.  Lab Results  Component Value Date   HGBA1C 5.6 07/19/2017   ------------------------------------------------------------------------------------------------------------------ Recent Labs    07/19/17 1717  TSH 0.711   ------------------------------------------------------------------------------------------------------------------ No results for input(s): VITAMINB12, FOLATE, FERRITIN, TIBC, IRON, RETICCTPCT in the last 72 hours.  Coagulation profile No results for input(s): INR, PROTIME in the last 168 hours.  No results for input(s): DDIMER in the last 72 hours.  Cardiac Enzymes Recent Labs  Lab 07/19/17 1717 07/20/17 0026 07/20/17 0325  TROPONINI <0.03 <0.03 <0.03   ------------------------------------------------------------------------------------------------------------------    Component Value Date/Time   BNP 295.9 (H) 02/07/2017 1645    Inpatient Medications  Scheduled Meds: . atorvastatin  40 mg Oral q1800  . cholecalciferol  4,000 Units Oral Daily  .  diltiazem  240 mg Oral BID  . fluticasone furoate-vilanterol  1 puff Inhalation Daily  . guaiFENesin  600 mg Oral BID  . insulin aspart  0-15 Units Subcutaneous TID WC  . insulin aspart  0-5 Units Subcutaneous  QHS  . levalbuterol  0.63 mg Nebulization Q6H  . levofloxacin  250 mg Oral Daily  . methylPREDNISolone (SOLU-MEDROL) injection  80 mg Intravenous Q12H  . metoprolol tartrate  75 mg Oral BID  . nicotine  21 mg Transdermal Daily  . Rivaroxaban  15 mg Oral Daily   Continuous Infusions: PRN Meds:.acetaminophen, bisacodyl, diltiazem, famotidine, ondansetron **OR** ondansetron (ZOFRAN) IV, traZODone  Micro Results Recent Results (from the past 240 hour(s))  Respiratory Panel by PCR     Status: None   Collection Time: 07/20/17 12:58 PM  Result Value Ref Range Status   Adenovirus NOT DETECTED NOT DETECTED Final   Coronavirus 229E NOT DETECTED NOT DETECTED Final   Coronavirus HKU1 NOT DETECTED NOT DETECTED Final   Coronavirus NL63 NOT DETECTED NOT DETECTED Final   Coronavirus OC43 NOT DETECTED NOT DETECTED Final   Metapneumovirus NOT DETECTED NOT DETECTED Final   Rhinovirus / Enterovirus NOT DETECTED NOT DETECTED Final   Influenza A NOT DETECTED NOT DETECTED Final   Influenza B NOT DETECTED NOT DETECTED Final   Parainfluenza Virus 1 NOT DETECTED NOT DETECTED Final   Parainfluenza Virus 2 NOT DETECTED NOT DETECTED Final   Parainfluenza Virus 3 NOT DETECTED NOT DETECTED Final   Parainfluenza Virus 4 NOT DETECTED NOT DETECTED Final   Respiratory Syncytial Virus NOT DETECTED NOT DETECTED Final   Bordetella pertussis NOT DETECTED NOT DETECTED Final   Chlamydophila pneumoniae NOT DETECTED NOT DETECTED Final   Mycoplasma pneumoniae NOT DETECTED NOT DETECTED Final    Radiology Reports Dg Chest Port 1 View  Result Date: 07/19/2017 CLINICAL DATA:  Cough EXAM: PORTABLE CHEST 1 VIEW COMPARISON:  February 07, 2017 FINDINGS: Lungs are mildly hyperexpanded. There is mild scarring in the left base. There is no edema or consolidation. Heart size and pulmonary vascularity are normal. No adenopathy. There is aortic atherosclerosis. There is calcification in each carotid artery. IMPRESSION: Lungs mildly  hyperexpanded with mild left base scarring. No edema or consolidation. Heart size normal. There is aortic atherosclerosis as well as calcification in both carotid arteries. Aortic Atherosclerosis (ICD10-I70.0). Electronically Signed   By: Lowella Grip III M.D.   On: 07/19/2017 13:52    Time Spent in minutes  35   Tameika Heckmann M.D on 07/21/2017 at 9:28 AM  Between 7am to 7pm - Pager - [phone]  After 7pm go to [email or web address] - password [facility]  [facility] -  Office  [phone]

## 2017-07-21 NOTE — Progress Notes (Signed)
PHARMACY NOTE:  ANTIMICROBIAL RENAL DOSAGE ADJUSTMENT  Current antimicrobial regimen includes a mismatch between antimicrobial dosage and estimated renal function.  As per policy approved by the Pharmacy & Therapeutics and Medical Executive Committees, the antimicrobial dosage will be adjusted accordingly.  Current antimicrobial dosage:  Levaquin 250mg  PO daily  Indication: COPD exacerbation  Renal Function:  Estimated Creatinine Clearance: 53.9 mL/min (A) (by C-G formula based on SCr of 1.36 mg/dL (H)).    Antimicrobial dosage has been changed to:  Levaquin 500mg  PO daily  Additional comments:  Consider transitioning patient to doxycycline if FQ is not needed?   Elicia Lamp, PharmD, BCPS Clinical Pharmacist Clinical phone for 07/21/2017 until 3:30pm: [phone] If after 3:30pm, please call main pharmacy at: [phone] 07/21/2017 12:04 PM

## 2017-07-22 ENCOUNTER — Encounter (HOSPITAL_COMMUNITY): Payer: Self-pay | Admitting: General Practice

## 2017-07-22 LAB — BASIC METABOLIC PANEL
ANION GAP: 10 (ref 5–15)
BUN: 30 mg/dL — ABNORMAL HIGH (ref 6–20)
CHLORIDE: 106 mmol/L (ref 101–111)
CO2: 26 mmol/L (ref 22–32)
Calcium: 9 mg/dL (ref 8.9–10.3)
Creatinine, Ser: 1.18 mg/dL (ref 0.61–1.24)
GFR calc non Af Amer: 59 mL/min — ABNORMAL LOW (ref >60)
Glucose, Bld: 157 mg/dL — ABNORMAL HIGH (ref 65–99)
Potassium: 4.8 mmol/L (ref 3.5–5.1)
SODIUM: 142 mmol/L (ref 135–145)

## 2017-07-22 LAB — GLUCOSE, CAPILLARY
GLUCOSE-CAPILLARY: 226 mg/dL — AB (ref 65–99)
Glucose-Capillary: 146 mg/dL — ABNORMAL HIGH (ref 65–99)
Glucose-Capillary: 164 mg/dL — ABNORMAL HIGH (ref 65–99)
Glucose-Capillary: 164 mg/dL — ABNORMAL HIGH (ref 65–99)

## 2017-07-22 MED ORDER — ZOLPIDEM TARTRATE 5 MG PO TABS
5.0000 mg | ORAL_TABLET | Freq: Every evening | ORAL | Status: DC | PRN
  Administered 2017-07-22: 5 mg via ORAL
  Filled 2017-07-22: qty 1

## 2017-07-22 MED ORDER — DOXYCYCLINE HYCLATE 100 MG PO TABS
100.0000 mg | ORAL_TABLET | Freq: Two times a day (BID) | ORAL | Status: DC
  Administered 2017-07-22 – 2017-07-23 (×2): 100 mg via ORAL
  Filled 2017-07-22 (×2): qty 1

## 2017-07-22 MED ORDER — LEVALBUTEROL HCL 0.63 MG/3ML IN NEBU: 0.6300 mg | INHALATION_SOLUTION | Freq: Four times a day (QID) | RESPIRATORY_TRACT | Status: DC | PRN

## 2017-07-22 MED ORDER — LEVALBUTEROL HCL 0.63 MG/3ML IN NEBU
0.6300 mg | INHALATION_SOLUTION | Freq: Three times a day (TID) | RESPIRATORY_TRACT | Status: DC
  Administered 2017-07-22 – 2017-07-23 (×4): 0.63 mg via RESPIRATORY_TRACT
  Filled 2017-07-22 (×4): qty 3

## 2017-07-22 NOTE — Progress Notes (Signed)
SATURATION QUALIFICATIONS: (This note is used to comply with regulatory documentation for home oxygen)  Patient Saturations on Room Air at Rest = 94%  Patient Saturations on Room Air while Ambulating = 86%  Patient Saturations on 2 Liters of oxygen while Ambulating = 90%  Please briefly explain why patient needs home oxygen: patient has shortness of breath with ambulation. Marcille Blanco, RN

## 2017-07-22 NOTE — Progress Notes (Signed)
PROGRESS NOTE                                                                                                                                                                                                             Patient Demographics:    Antonio Wise, is a 77 y.o. male, DOB - 1940-08-25, ZOX:[id number]  Admit date - 07/19/2017   Admitting Physician Karmen Bongo, MD  Outpatient Primary MD for the patient is [name]  LOS - 3  Outpatient Specialists NONE  Chief Complaint  Patient presents with  . Shortness of Breath       Brief Narrative  77 year old male with history of COPD not on home O2, ongoing tobacco use, A. fib on Xarelto, hypertension, hyperlipidemia and history of GI bleed presented with COPD exacerbation and acute respiratory failure with hypoxia.    Subjective:    Patient seen and examined this morning. Reports his breathing to be better since admission. Still has some cough with whitish phlegm.   Assessment  & Plan :    Principal Problem:   Acute respiratory failure with hypoxia (HCC)   COPD exacerbation (HCC) Symptoms do not seem much change from yesterday. Continue O2 via nasal cannula (still requiring 2-3 L). Continue IV Solu-Medrol, scheduled Xopenex , and Atrovent, Started on empiric Levaquin changing to doxycycline today. Continue antitussives. Added nicotine patch. Counseled strongly on smoking cessation. Will assess for home O2 needs prior to discharge.  Active Problems:    Hyperglycemia   Atrial fibrillation with rapid ventricular response (HCC) Likely triggered by COPD exacerbation. Heart rate in 120s on the monitor. increase metoprolol dose. Is on quite high dose of Cardizem at home which is continued. Albuterol switch to Xopenex for elevated heart rate. Continue Xarelto.    Acute kidney injury ([place]) Improving with hydration. Avoid nephrotoxins.  Essential  hypertension Stable. Continue home medications  Hyperlipidemia Continue statin  Hyperkalemia Daily monitor.  Episode of confusion. Likely triggered by trazodone. Changing to Ambien.  Code Status : Full code  Family Communication  : No family at bedside  Disposition Plan  : Home Possibly tomorrow  Barriers For Discharge : Active symptoms  Consults  : None  Procedures  : None  DVT Prophylaxis  :  Xarelto  Lab Results  Component Value Date   PLT 305 07/20/2017  Antibiotics  :    Anti-infectives (From admission, onward)   Start     Dose/Rate Route Frequency Ordered Stop   07/22/17 2200  doxycycline (VIBRA-TABS) tablet 100 mg     100 mg Oral Every 12 hours 07/22/17 1722     07/22/17 1000  levofloxacin (LEVAQUIN) tablet 500 mg  Status:  Discontinued     500 mg Oral Daily 07/21/17 1202 07/22/17 1722   07/21/17 1000  levofloxacin (LEVAQUIN) tablet 250 mg  Status:  Discontinued     250 mg Oral Daily 07/20/17 1630 07/21/17 1202   07/19/17 1730  levofloxacin (LEVAQUIN) tablet 500 mg  Status:  Discontinued     500 mg Oral Daily 07/19/17 1723 07/20/17 1630        Objective:   Vitals:   07/22/17 0906 07/22/17 1133 07/22/17 1324 07/22/17 1345  BP:  107/84    Pulse:  (!) 111    Resp:  18    Temp:  98 F (36.7 C)    TempSrc:  Oral    SpO2: 93% 94% 92% 95%  Weight:      Height:        Wt Readings from Last 3 Encounters:  07/22/17 91.7 kg (202 lb 3.2 oz)  05/20/17 92.3 kg (203 lb 6.4 oz)  03/01/17 88 kg (194 lb)     Intake/Output Summary (Last 24 hours) at 07/22/2017 1722 Last data filed at 07/22/2017 1134 Gross per 24 hour  Intake 720 ml  Output 1200 ml  Net -480 ml     Physical Exam  Gen: not in distress, appears fatigued, able to give full sentences HEENT: no pallor, moist mucosa, supple neck Chest: Diminished breath sounds bilaterally with diffuse expiratory wheezing CVS: S1 and S2 irregularly irregular, no murmurs rub or gallop GI: soft, NT, ND,    Musculoskeletal: warm, no edema     Data Review:    CBC Recent Labs  Lab 07/19/17 1341 07/19/17 1615 07/20/17 0325  WBC 17.3*  --  12.1*  HGB 15.5 16.7 13.8  HCT 49.4 49.0 45.0  PLT 443*  --  305  MCV 101.9*  --  103.2*  MCH 32.0  --  31.7  MCHC 31.4  --  30.7  RDW 14.6  --  14.5  LYMPHSABS 3.6  --   --   MONOABS 2.7*  --   --   EOSABS 0.0  --   --   BASOSABS 0.1  --   --     Chemistries  Recent Labs  Lab 07/19/17 1554 07/19/17 1615 07/20/17 0325 07/20/17 1428 07/21/17 0631 07/22/17 0641  NA 141 141 139 137 138 142  K 4.9 4.3 4.8 5.2* 5.3* 4.8  CL 100* 100* 100* 100* 103 106  CO2 27  --  27 26 27 26   GLUCOSE 215* 223* 199* 159* 173* 157*  BUN 21* 25* 31* 36* 32* 30*  CREATININE 2.09* 1.80* 2.05* 1.81* 1.36* 1.18  CALCIUM 9.1  --  8.9 9.2 8.8* 9.0   ------------------------------------------------------------------------------------------------------------------ No results for input(s): CHOL, HDL, LDLCALC, TRIG, CHOLHDL, LDLDIRECT in the last 72 hours.  Lab Results  Component Value Date   HGBA1C 5.6 07/19/2017   ------------------------------------------------------------------------------------------------------------------ No results for input(s): TSH, T4TOTAL, T3FREE, THYROIDAB in the last 72 hours.  Invalid input(s): FREET3 ------------------------------------------------------------------------------------------------------------------ No results for input(s): VITAMINB12, FOLATE, FERRITIN, TIBC, IRON, RETICCTPCT in the last 72 hours.  Coagulation profile No results for input(s): INR, PROTIME in the last 168 hours.  No results for input(s):  DDIMER in the last 72 hours.  Cardiac Enzymes Recent Labs  Lab 07/19/17 1717 07/20/17 0026 07/20/17 0325  TROPONINI <0.03 <0.03 <0.03   ------------------------------------------------------------------------------------------------------------------    Component Value Date/Time   BNP 295.9 (H)  02/07/2017 1645    Inpatient Medications  Scheduled Meds: . atorvastatin  40 mg Oral q1800  . cholecalciferol  4,000 Units Oral Daily  . diltiazem  240 mg Oral BID  . doxycycline  100 mg Oral Q12H  . fluticasone furoate-vilanterol  1 puff Inhalation Daily  . guaiFENesin  600 mg Oral BID  . insulin aspart  0-15 Units Subcutaneous TID WC  . insulin aspart  0-5 Units Subcutaneous QHS  . ipratropium  0.5 mg Nebulization Q6H  . levalbuterol  0.63 mg Nebulization TID  . methylPREDNISolone (SOLU-MEDROL) injection  80 mg Intravenous Q12H  . metoprolol tartrate  75 mg Oral BID  . nicotine  21 mg Transdermal Daily  . Rivaroxaban  20 mg Oral Q supper   Continuous Infusions: PRN Meds:.acetaminophen, bisacodyl, diltiazem, famotidine, levalbuterol, ondansetron **OR** ondansetron (ZOFRAN) IV, zolpidem  Micro Results Recent Results (from the past 240 hour(s))  Respiratory Panel by PCR     Status: None   Collection Time: 07/20/17 12:58 PM  Result Value Ref Range Status   Adenovirus NOT DETECTED NOT DETECTED Final   Coronavirus 229E NOT DETECTED NOT DETECTED Final   Coronavirus HKU1 NOT DETECTED NOT DETECTED Final   Coronavirus NL63 NOT DETECTED NOT DETECTED Final   Coronavirus OC43 NOT DETECTED NOT DETECTED Final   Metapneumovirus NOT DETECTED NOT DETECTED Final   Rhinovirus / Enterovirus NOT DETECTED NOT DETECTED Final   Influenza A NOT DETECTED NOT DETECTED Final   Influenza B NOT DETECTED NOT DETECTED Final   Parainfluenza Virus 1 NOT DETECTED NOT DETECTED Final   Parainfluenza Virus 2 NOT DETECTED NOT DETECTED Final   Parainfluenza Virus 3 NOT DETECTED NOT DETECTED Final   Parainfluenza Virus 4 NOT DETECTED NOT DETECTED Final   Respiratory Syncytial Virus NOT DETECTED NOT DETECTED Final   Bordetella pertussis NOT DETECTED NOT DETECTED Final   Chlamydophila pneumoniae NOT DETECTED NOT DETECTED Final   Mycoplasma pneumoniae NOT DETECTED NOT DETECTED Final    Radiology Reports Dg  Chest Port 1 View  Result Date: 07/19/2017 CLINICAL DATA:  Cough EXAM: PORTABLE CHEST 1 VIEW COMPARISON:  February 07, 2017 FINDINGS: Lungs are mildly hyperexpanded. There is mild scarring in the left base. There is no edema or consolidation. Heart size and pulmonary vascularity are normal. No adenopathy. There is aortic atherosclerosis. There is calcification in each carotid artery. IMPRESSION: Lungs mildly hyperexpanded with mild left base scarring. No edema or consolidation. Heart size normal. There is aortic atherosclerosis as well as calcification in both carotid arteries. Aortic Atherosclerosis (ICD10-I70.0). Electronically Signed   By: Lowella Grip III M.D.   On: 07/19/2017 13:52    Time Spent in minutes  35   Author:  Berle Mull, MD [facility] Pager: [phone] 07/22/2017 5:24 PM     After 7pm go to [email or web address] - password [email or web address]  [facility] -  Office  [phone]

## 2017-07-23 LAB — BASIC METABOLIC PANEL
Anion gap: 10 (ref 5–15)
BUN: 34 mg/dL — AB (ref 6–20)
CALCIUM: 8.6 mg/dL — AB (ref 8.9–10.3)
CHLORIDE: 101 mmol/L (ref 101–111)
CO2: 29 mmol/L (ref 22–32)
CREATININE: 1.26 mg/dL — AB (ref 0.61–1.24)
GFR calc Af Amer: >60 mL/min (ref >60)
GFR calc non Af Amer: 54 mL/min — ABNORMAL LOW (ref >60)
Glucose, Bld: 207 mg/dL — ABNORMAL HIGH (ref 65–99)
Potassium: 5 mmol/L (ref 3.5–5.1)
Sodium: 140 mmol/L (ref 135–145)

## 2017-07-23 LAB — CBC WITH DIFFERENTIAL/PLATELET
BASOS PCT: 0 %
Basophils Absolute: 0 10*3/uL (ref 0.0–0.1)
EOS ABS: 0 10*3/uL (ref 0.0–0.7)
Eosinophils Relative: 0 %
HEMATOCRIT: 44 % (ref 39.0–52.0)
HEMOGLOBIN: 13.7 g/dL (ref 13.0–17.0)
LYMPHS ABS: 1.1 10*3/uL (ref 0.7–4.0)
LYMPHS PCT: 7 %
MCH: 31.3 pg (ref 26.0–34.0)
MCHC: 31.1 g/dL (ref 30.0–36.0)
MCV: 100.5 fL — ABNORMAL HIGH (ref 78.0–100.0)
Monocytes Absolute: 1.5 10*3/uL — ABNORMAL HIGH (ref 0.1–1.0)
Monocytes Relative: 10 %
Neutro Abs: 12.8 10*3/uL — ABNORMAL HIGH (ref 1.7–7.7)
Neutrophils Relative %: 83 %
Platelets: 342 10*3/uL (ref 150–400)
RBC: 4.38 MIL/uL (ref 4.22–5.81)
RDW: 14.1 % (ref 11.5–15.5)
WBC: 15.4 10*3/uL — ABNORMAL HIGH (ref 4.0–10.5)

## 2017-07-23 LAB — GLUCOSE, CAPILLARY
Glucose-Capillary: 166 mg/dL — ABNORMAL HIGH (ref 65–99)
Glucose-Capillary: 141 mg/dL — ABNORMAL HIGH (ref 65–99)

## 2017-07-23 MED ORDER — IPRATROPIUM BROMIDE 0.02 % IN SOLN: 0.5000 mg | Freq: Three times a day (TID) | RESPIRATORY_TRACT | 12 refills | Status: DC

## 2017-07-23 MED ORDER — FUROSEMIDE 20 MG PO TABS
20.0000 mg | ORAL_TABLET | Freq: Every day | ORAL | Status: DC
  Administered 2017-07-23: 20 mg via ORAL
  Filled 2017-07-23: qty 1

## 2017-07-23 MED ORDER — DOXYCYCLINE HYCLATE 100 MG PO TABS: 100.0000 mg | ORAL_TABLET | Freq: Two times a day (BID) | ORAL | 0 refills | Status: AC

## 2017-07-23 MED ORDER — NICOTINE 21 MG/24HR TD PT24: 21.0000 mg | MEDICATED_PATCH | Freq: Every day | TRANSDERMAL | 0 refills | Status: DC

## 2017-07-23 MED ORDER — PREDNISONE 50 MG PO TABS
50.0000 mg | ORAL_TABLET | Freq: Every day | ORAL | Status: DC
  Administered 2017-07-23: 50 mg via ORAL
  Filled 2017-07-23: qty 1

## 2017-07-23 MED ORDER — GUAIFENESIN ER 600 MG PO TB12: 600.0000 mg | ORAL_TABLET | Freq: Two times a day (BID) | ORAL | 0 refills | Status: DC

## 2017-07-23 MED ORDER — PREDNISONE 10 MG PO TABS: ORAL_TABLET | ORAL | 0 refills | Status: DC

## 2017-07-23 MED ORDER — ALBUTEROL SULFATE HFA 108 (90 BASE) MCG/ACT IN AERS: 1.0000 | INHALATION_SPRAY | Freq: Four times a day (QID) | RESPIRATORY_TRACT | 0 refills | Status: DC | PRN

## 2017-07-23 MED ORDER — IPRATROPIUM BROMIDE 0.02 % IN SOLN
0.5000 mg | Freq: Three times a day (TID) | RESPIRATORY_TRACT | Status: DC
  Administered 2017-07-23 (×2): 0.5 mg via RESPIRATORY_TRACT
  Filled 2017-07-23 (×2): qty 2.5

## 2017-07-23 MED ORDER — FUROSEMIDE 20 MG PO TABS: ORAL_TABLET | ORAL | 1 refills | Status: DC

## 2017-07-23 MED ORDER — FAMOTIDINE 20 MG PO TABS: 20.0000 mg | ORAL_TABLET | Freq: Two times a day (BID) | ORAL | 0 refills | Status: DC | PRN

## 2017-07-23 NOTE — Consult Note (Signed)
   [facility] Inpatient Consult   07/23/2017  Antonio Wise Jan 10, 1941 [id number]  Patient screened for potential [place] [facility] services. Patient is in the [place] of the [place] [facility] services under patient's [employer] plan. Met with the patient at the bedside.  Brochure and 24 hour nurse line magent given with contact information.  Patient states that he was driving prior to admission, he [place], PA-C at [place].  This office is listed to provide the post hospital transition of care calls. No needs assessed at this time.  For questions contact:   Natividad Brood, RN BSN [facility] Liaison  [phone] business mobile phone Toll free office [phone]

## 2017-07-23 NOTE — Progress Notes (Signed)
Patient discharged home on home oxygen; Dan with [facility] called to bring the patient portable oxygen tank to the room prior to discharge and more oxygen tanks to be delivered to the patient's home after discharge. Mindi Slicker Rn,MHA,BSN [phone]

## 2017-07-23 NOTE — Progress Notes (Signed)
Pt being discharged home via wheelchair with family. Pt alert and oriented x4. VSS. Pt c/o no pain at this time. No signs of respiratory distress. Pt on 2L/[place]. Home oxygen delivered to room. Education complete and care plans resolved. IV removed with catheter intact and pt tolerated well. No further issues at this time. Pt to follow up with PCP. Leanne Chang, RN

## 2017-07-26 DIAGNOSIS — J9600 Acute respiratory failure, unspecified whether with hypoxia or hypercapnia: Secondary | ICD-10-CM | POA: Diagnosis not present

## 2017-07-26 DIAGNOSIS — J441 Chronic obstructive pulmonary disease with (acute) exacerbation: Secondary | ICD-10-CM | POA: Diagnosis not present

## 2017-07-26 NOTE — Discharge Summary (Signed)
[facility] Discharge Summary   Patient: Antonio Wise:[id number]   PCP: [name], PA-C DOB: 1941/01/15   Date of admission: 07/19/2017   Date of discharge: 07/23/2017    Discharge Diagnoses:  Principal Problem:   Acute respiratory failure ([place]) Active Problems:   Hypertension   Hyperlipidemia LDL goal <70   COPD exacerbation (HCC)   Hyperglycemia   Atrial fibrillation with rapid ventricular response ([place])   Acute kidney injury ([place])   Admitted From: home Disposition:  home  Recommendations for Outpatient Follow-up:  1. Please follow-up with PCP in 1 week. 2. Recommend to follow-up and establish care with pulmonology as an outpatient. 3. Going home with 2 L of oxygen  Follow-up Information    [name], PA-C. Go on 08/09/2017.   Specialty:  Physician Assistant Why:  @10 :30am Contact information: [place] [address] [phone]        [facility] Follow up.   Why:  They will deliver more oxygen tanks to your home Contact information: [address] [address] [phone]          Diet recommendation: Cardiac diet  Activity: The patient is advised to gradually reintroduce usual activities.  Discharge Condition: good  Code Status: Full code  History of present illness: As per the H and P dictated on admission, "Antonio Wise is a pleasant 77 y.o. male with medical history significant COPD not on home oxygen, A. fib, smoker, hypertension hyperlipidemia, hyperglycemia, presents to the emergency Department chief complaint of persistent worsening shortness of breath. EMS reports oxygen saturation level 60% on room air. Initial evaluation reveals A. fib with RVR as well. [facility] are asked to admit.  Information is obtained from the patient and the chart. He states for the last 3 days is felt more short of breath increased wheezing increased sputum production and coughing. He  states he used his home inhaler and nebulizer without relief. He denies fever chills headache dizziness syncope or near-syncope. He denies chest pain palpitations lower extremity edema or orthopnea. Denies abdominal pain nausea vomiting diarrhea constipation melena or bright red blood per rectum. He denies dysuria hematuria frequency or urgency. He does state he scheduled for colonoscopy in 2 days and is supposed to start the prep tomorrow. I informed him that we'll likely need to be rescheduled"  Hospital Course:  Summary of his active problems in the hospital is as following.   Acute respiratory failure with hypoxia (HCC)   COPD exacerbation (HCC) Symptoms do not seem much change from yesterday. Continue O2 via nasal cannula (still requiring 2-3 L). Continue IV Solu-Medrol, scheduled Xopenex , and Atrovent, Started on empiric Levaquin changing to doxycycline. Continue antitussives. Added nicotine patch. Counseled strongly on smoking cessation.  Still requested liters of oxygen on ambulation to maintain adequate saturation. Will discharge with 2 L of oxygen right now.    Hyperglycemia   Atrial fibrillation with rapid ventricular response (HCC) Likely triggered by COPD exacerbation. Heart rate in 120s on the monitor. increase metoprolol dose temporarily, resuming home dose on discharge. Is on quite high dose of Cardizem at home which is continued. Albuterol switch to Xopenex for elevated heart rate. Continue Xarelto.    Acute kidney injury ([place]) Improving with hydration. Avoid nephrotoxins.  Essential hypertension Stable. Continue home medications  Hyperlipidemia Continue statin  Hyperkalemia Daily monitor.  Episode of confusion. Likely triggered by trazodone. No confusing reaction to Ambien.  All other chronic medical condition were stable during the hospitalization.  Patient was seen by physical therapy, who recommended home health, which was arranged by Education officer, museum and  case Freight forwarder. On the day of the discharge the patient's vitals were stable, and no other acute medical condition were reported by patient. the patient was felt safe to be discharge at home with home health.  Procedures and Results:  none   Consultations:  none  DISCHARGE MEDICATION: Allergies as of 07/23/2017      Reactions   Codeine Nausea And Vomiting   Severe nausea      Medication List    STOP taking these medications   benazepril 20 MG tablet Commonly known as:  LOTENSIN     TAKE these medications   acetaminophen 500 MG tablet Commonly known as:  TYLENOL Take 500 mg by mouth every 6 (six) hours as needed for headache.   albuterol 108 (90 Base) MCG/ACT inhaler Commonly known as:  PROVENTIL HFA;VENTOLIN HFA Inhale 1 puff into the lungs every 6 (six) hours as needed for wheezing or shortness of breath.   atorvastatin 40 MG tablet Commonly known as:  LIPITOR TAKE 1 TABLET(40 MG) BY MOUTH DAILY AT 6 PM   BREO ELLIPTA 100-25 MCG/INH Aepb Generic drug:  fluticasone furoate-vilanterol INHALE 1 PUFF DAILY What changed:  See the new instructions.   Cholecalciferol 4000 units Caps Take 1 capsule (4,000 Units total) by mouth daily.   diltiazem 240 MG 24 hr capsule Commonly known as:  CARDIZEM CD Take 2 capsules (480 mg total) by mouth daily. What changed:    how much to take  when to take this   doxycycline 100 MG tablet Commonly known as:  VIBRA-TABS Take 1 tablet (100 mg total) by mouth every 12 (twelve) hours for 3 days.   famotidine 20 MG tablet Commonly known as:  PEPCID Take 1 tablet (20 mg total) by mouth 2 (two) times daily as needed for heartburn.   furosemide 20 MG tablet Commonly known as:  LASIX TAKE 1 TABLET(20 MG) BY MOUTH DAILY   guaiFENesin 600 MG 12 hr tablet Commonly known as:  MUCINEX Take 1 tablet (600 mg total) by mouth 2 (two) times daily.   ipratropium 0.02 % nebulizer solution Commonly known as:  ATROVENT Take 2.5 mLs (0.5 mg  total) by nebulization 3 (three) times daily.   levalbuterol 0.63 MG/3ML nebulizer solution Commonly known as:  XOPENEX Take 3 mLs (0.63 mg total) by nebulization every 6 (six) hours as needed for wheezing or shortness of breath.   metoprolol tartrate 50 MG tablet Commonly known as:  LOPRESSOR Take 1 tablet (50 mg total) by mouth 2 (two) times daily.   nicotine 21 mg/24hr patch Commonly known as:  NICODERM CQ - dosed in mg/24 hours Place 1 patch (21 mg total) onto the skin daily.   predniSONE 10 MG tablet Commonly known as:  DELTASONE Take 50mg  daily for 3days,Take 40mg  daily for 3days,Take 30mg  daily for 3days,Take 20mg  daily for 3days,Take 10mg  daily for 3days, then stop.   XARELTO 20 MG Tabs tablet Generic drug:  rivaroxaban TAKE 1 TABLET(20 MG) BY MOUTH DAILY WITH SUPPER      Allergies  Allergen Reactions  . Codeine Nausea And Vomiting    Severe nausea   Discharge Instructions    Diet - low sodium heart healthy   Complete by:  As directed    Discharge instructions   Complete by:  As directed    It is important  that you read following instructions as well as go over your medication list with RN to help you understand your care after this hospitalization.  Discharge Instructions: Please follow-up with PCP in one week  Please request your primary care physician to go over all Hospital Tests and Procedure/Radiological results at the follow up,  Please get all Hospital records sent to your PCP by signing hospital release before you go home.   Do not take more than prescribed Pain, Sleep and Anxiety Medications. You were cared for by a hospitalist during your hospital stay. If you have any questions about your discharge medications or the care you received while you were in the hospital after you are discharged, you can call the unit and ask to speak with the hospitalist on call if the hospitalist that took care of you is not available.  Once you are discharged, your primary  care physician will handle any further medical issues. Please note that NO REFILLS for any discharge medications will be authorized once you are discharged, as it is imperative that you return to your primary care physician (or establish a relationship with a primary care physician if you do not have one) for your aftercare needs so that they can reassess your need for medications and monitor your lab values. You Must read complete instructions/literature along with all the possible adverse reactions/side effects for all the Medicines you take and that have been prescribed to you. Take any new Medicines after you have completely understood and accept all the possible adverse reactions/side effects. Wear Seat belts while driving. If you have smoked or chewed Tobacco in the last 2 yrs please stop smoking and/or stop any Recreational drug use.   Increase activity slowly   Complete by:  As directed      Discharge Exam: Filed Weights   07/21/17 0601 07/22/17 0527 07/23/17 0550  Weight: 90.5 kg (199 lb 8 oz) 91.7 kg (202 lb 3.2 oz) 92.1 kg (203 lb)   Vitals:   07/23/17 1339 07/23/17 1530  BP:  135/79  Pulse:  98  Resp:  18  Temp:  98.4 F (36.9 C)  SpO2: 98% 98%   General: Appear in no distress, no Rash; Oral Mucosa moist. Cardiovascular: S1 and S2 Present, no Murmur, no JVD Respiratory: Bilateral Air entry present and no Crackles, Occasional wheezes Abdomen: Bowel Sound present, Soft and no tenderness Extremities: no Pedal edema, no calf tenderness Neurology: Grossly no focal neuro deficit.no  The results of significant diagnostics from this hospitalization (including imaging, microbiology, ancillary and laboratory) are listed below for reference.    Significant Diagnostic Studies: Dg Chest Port 1 View  Result Date: 07/19/2017 CLINICAL DATA:  Cough EXAM: PORTABLE CHEST 1 VIEW COMPARISON:  February 07, 2017 FINDINGS: Lungs are mildly hyperexpanded. There is mild scarring in the left base.  There is no edema or consolidation. Heart size and pulmonary vascularity are normal. No adenopathy. There is aortic atherosclerosis. There is calcification in each carotid artery. IMPRESSION: Lungs mildly hyperexpanded with mild left base scarring. No edema or consolidation. Heart size normal. There is aortic atherosclerosis as well as calcification in both carotid arteries. Aortic Atherosclerosis (ICD10-I70.0). Electronically Signed   By: Lowella Grip III M.D.   On: 07/19/2017 13:52    Microbiology: Recent Results (from the past 240 hour(s))  Respiratory Panel by PCR     Status: None   Collection Time: 07/20/17 12:58 PM  Result Value Ref Range Status   Adenovirus NOT DETECTED NOT DETECTED Final  Coronavirus 229E NOT DETECTED NOT DETECTED Final   Coronavirus HKU1 NOT DETECTED NOT DETECTED Final   Coronavirus NL63 NOT DETECTED NOT DETECTED Final   Coronavirus OC43 NOT DETECTED NOT DETECTED Final   Metapneumovirus NOT DETECTED NOT DETECTED Final   Rhinovirus / Enterovirus NOT DETECTED NOT DETECTED Final   Influenza A NOT DETECTED NOT DETECTED Final   Influenza B NOT DETECTED NOT DETECTED Final   Parainfluenza Virus 1 NOT DETECTED NOT DETECTED Final   Parainfluenza Virus 2 NOT DETECTED NOT DETECTED Final   Parainfluenza Virus 3 NOT DETECTED NOT DETECTED Final   Parainfluenza Virus 4 NOT DETECTED NOT DETECTED Final   Respiratory Syncytial Virus NOT DETECTED NOT DETECTED Final   Bordetella pertussis NOT DETECTED NOT DETECTED Final   Chlamydophila pneumoniae NOT DETECTED NOT DETECTED Final   Mycoplasma pneumoniae NOT DETECTED NOT DETECTED Final     Labs: CBC: Recent Labs  Lab 07/19/17 1341 07/19/17 1615 07/20/17 0325 07/23/17 0603  WBC 17.3*  --  12.1* 15.4*  NEUTROABS 10.9*  --   --  12.8*  HGB 15.5 16.7 13.8 13.7  HCT 49.4 49.0 45.0 44.0  MCV 101.9*  --  103.2* 100.5*  PLT 443*  --  305 446   Basic Metabolic Panel: Recent Labs  Lab 07/20/17 0325 07/20/17 1428  07/21/17 0631 07/22/17 0641 07/23/17 0603  NA 139 137 138 142 140  K 4.8 5.2* 5.3* 4.8 5.0  CL 100* 100* 103 106 101  CO2 27 26 27 26 29   GLUCOSE 199* 159* 173* 157* 207*  BUN 31* 36* 32* 30* 34*  CREATININE 2.05* 1.81* 1.36* 1.18 1.26*  CALCIUM 8.9 9.2 8.8* 9.0 8.6*   Liver Function Tests: No results for input(s): AST, ALT, ALKPHOS, BILITOT, PROT, ALBUMIN in the last 168 hours. No results for input(s): LIPASE, AMYLASE in the last 168 hours. No results for input(s): AMMONIA in the last 168 hours. Cardiac Enzymes: Recent Labs  Lab 07/19/17 1717 07/20/17 0026 07/20/17 0325  TROPONINI <0.03 <0.03 <0.03   BNP (last 3 results) Recent Labs    02/07/17 1645  BNP 295.9*   CBG: Recent Labs  Lab 07/22/17 1131 07/22/17 1620 07/22/17 2108 07/23/17 0724 07/23/17 1218  GLUCAP 164* 164* 226* 166* 141*   Time spent: 35 minutes  Signed:  Berle Mull  [facility] 07/23/2017 , 7:11 AM

## 2017-08-09 ENCOUNTER — Ambulatory Visit (INDEPENDENT_AMBULATORY_CARE_PROVIDER_SITE_OTHER): Payer: PPO | Admitting: Physician Assistant

## 2017-08-09 ENCOUNTER — Encounter: Payer: Self-pay | Admitting: Physician Assistant

## 2017-08-09 VITALS — BP 142/86 | HR 67 | Temp 97.7°F | Resp 16 | Wt 212.4 lb

## 2017-08-09 DIAGNOSIS — J9601 Acute respiratory failure with hypoxia: Secondary | ICD-10-CM | POA: Diagnosis not present

## 2017-08-09 DIAGNOSIS — Z09 Encounter for follow-up examination after completed treatment for conditions other than malignant neoplasm: Secondary | ICD-10-CM | POA: Diagnosis not present

## 2017-08-09 DIAGNOSIS — J9602 Acute respiratory failure with hypercapnia: Secondary | ICD-10-CM | POA: Diagnosis not present

## 2017-08-09 DIAGNOSIS — I480 Paroxysmal atrial fibrillation: Secondary | ICD-10-CM | POA: Diagnosis not present

## 2017-08-09 DIAGNOSIS — E785 Hyperlipidemia, unspecified: Secondary | ICD-10-CM

## 2017-08-09 DIAGNOSIS — I10 Essential (primary) hypertension: Secondary | ICD-10-CM

## 2017-08-09 DIAGNOSIS — F17200 Nicotine dependence, unspecified, uncomplicated: Secondary | ICD-10-CM

## 2017-08-09 DIAGNOSIS — I4891 Unspecified atrial fibrillation: Secondary | ICD-10-CM | POA: Diagnosis not present

## 2017-08-09 DIAGNOSIS — I739 Peripheral vascular disease, unspecified: Secondary | ICD-10-CM | POA: Diagnosis not present

## 2017-08-09 DIAGNOSIS — J441 Chronic obstructive pulmonary disease with (acute) exacerbation: Secondary | ICD-10-CM

## 2017-08-09 NOTE — Progress Notes (Signed)
Patient ID: Antonio Wise MRN: [id number], DOB: 1940/09/20, 77 y.o. Date of Encounter: @DATE @  Chief Complaint:  Chief Complaint  Patient presents with  . Hospitalization Follow-up    HPI: 77 y.o. year old male  presents with above.   He came here and had office visit with me 07/19/2017.  At that time he was found to be in acute respiratory failure and EMS was called and transferred to hospital. Today I have reviewed his hospital discharge summary.  Hospitalized 07/19/2017 - 07/23/2017.  THE FOLLOWING SECTION IS COPIED FROM HOSPITAL DISCHARGE SUMMARY:    Recommendations for Outpatient Follow-up:  1. Please follow-up with PCP in 1 week. 2. Recommend to follow-up and establish care with pulmonology as an outpatient. 3. Going home with 2 L of oxygen  History of present illness: As per the H and P dictated on admission, "Antonio Wise a 77 y.o.malewith medical history significantCOPD not on home oxygen, A. fib, smoker, hypertension hyperlipidemia, hyperglycemia, presents to the emergency Department chief complaint of persistent worsening shortness of breath.EMS reports oxygen saturation level 60% on room air. Initial evaluation reveals A. fib with RVR as well. [facility] are asked to admit.  Information is obtained from the patient and the chart. He states for the last 3 days is felt more short of breath increased wheezing increased sputum production and coughing. He states he used his home inhaler and nebulizer without relief. He denies fever chills headache dizziness syncope or near-syncope. He denies chest pain palpitations lower extremity edema or orthopnea. Denies abdominal pain nausea vomiting diarrhea constipation melena or bright red blood per rectum. He denies dysuria hematuria frequency or urgency. He does state he scheduled for colonoscopy in 2 days and is supposed to start the prep tomorrow. I informed him    that we'll likely need to be  rescheduled"  Hospital Course:  Summary of his active problems in the hospital is as following. Acute respiratory failure with hypoxia (HCC) COPD exacerbation (HCC) Symptoms do not seem much change from yesterday. Continue O2 via nasal cannula (still requiring 2-3 L). Continue IV Solu-Medrol, scheduled Xopenex , and Atrovent, Started onempiric Levaquin changing to doxycycline. Continueantitussives. Added nicotine patch. Counseled strongly on smoking cessation.  Still requested liters of oxygen on ambulation to maintain adequate saturation. Will discharge with 2 L of oxygen right now.   Hyperglycemia Atrial fibrillation with rapid ventricular response (HCC) Likely triggered by COPD exacerbation. Heart rate in 120s on the monitor. increase metoprolol dose temporarily, resuming home dose on discharge. Is on quite high dose of Cardizem at home which is continued. Albuterol switch to Xopenex for elevated heart rate. Continue Xarelto.  Acute kidney injury ([place]) Improving with hydration. Avoid nephrotoxins.  Essential hypertension Stable. Continue home medications  Hyperlipidemia Continue statin  Hyperkalemia Daily monitor.  Episode of confusion. Likely triggered by trazodone. No confusing reaction to Ambien.   All other chronic medical condition were stable during the hospitalization.  Patient was seen by physical therapy, who recommended home health, which was arranged by Education officer, museum and case Freight forwarder. On the day of the discharge the patient's vitals were stable, and no other acute medical condition were reported by patient. the patient was felt safe to be discharge at home with home health.  ----------------------------------------------------------------------------------------------------------------------------------------------------------------------------------------------------------------------------------------------------  Today his 2 daughters  accompany him for visit. In the exam room he is not wearing any nasal cannula oxygen. On room air his oxygen saturation is 98%. Daughters report that his oxygen is in the  car.  They state that he has not been wearing it continuously like he was supposed to. Antonio Wise reports that he did complete take the antibiotics and other medications that he was instructed to take at the time of discharge from the hospital. He reports that he feels that his breathing is back to baseline. Reports that he has not smoked at all since 07/16/17.  Says that he did use the patch some but now is off the patch.  We discussed that he had been scheduled to have colonoscopy by Dr. [name] 07/21/17.  When I saw Antonio Wise for his visit and had him transferred to the hospital via EMS I called and notified Dr. Estell Harpin office. Today I have called Dr. Estell Harpin office and informed them that Antonio Wise is now here for follow-up visit after his hospitalization that he is now stable and I will follow-up with getting him rescheduled for that colonoscopy. Today I have discussed need for follow-up with pulmonary.  Mr. Casella is agreeable and I will put in this referral. No specific concerns to address today.  Is feeling back to his usual baseline.       Past Medical History:  Diagnosis Date  . Colon polyp 05/06/2008   repeat 5 years  . COPD (chronic obstructive pulmonary disease) ([place])   . Duodenal ulcer 05/06/2008   EGD  . Elevated PSA   . Gastric ulcer 05/06/2008   EGD  . H/O Helicobacter infection 05/06/2008   EGD  . H/O: GI bleed   . History of nuclear stress test 02/19/2010   bruce protocol; mild-mod perfusion defect in spetal region consistent with attenuation artifact; no significant ischemia demonstrated; dysrhythmia during exercise  . Hyperlipidemia   . Hypertension   . Peripheral vascular disease ([place])    a. s/p left common iliac artery stenting in 2006  . Persistent atrial fibrillation ([place])    a. on Xarelto  .  Smoker unmotivated to quit   . Vitamin D deficiency      Home Meds: Outpatient Medications Prior to Visit  Medication Sig Dispense Refill  . acetaminophen (TYLENOL) 500 MG tablet Take 500 mg by mouth every 6 (six) hours as needed for headache.    . albuterol (PROVENTIL HFA;VENTOLIN HFA) 108 (90 Base) MCG/ACT inhaler Inhale 1 puff into the lungs every 6 (six) hours as needed for wheezing or shortness of breath. 1 Inhaler 0  . atorvastatin (LIPITOR) 40 MG tablet TAKE 1 TABLET(40 MG) BY MOUTH DAILY AT 6 PM 90 tablet 3  . BREO ELLIPTA 100-25 MCG/INH AEPB INHALE 1 PUFF DAILY (Patient taking differently: INHALE 1 PUFF BY INHALATION ONCE DAILY) 1 each 3  . Cholecalciferol 4000 units CAPS Take 1 capsule (4,000 Units total) by mouth daily. 30 capsule 3  . diltiazem (CARDIZEM CD) 240 MG 24 hr capsule Take 2 capsules (480 mg total) by mouth daily. (Patient taking differently: Take 240 mg by mouth 2 (two) times daily. ) 60 capsule 2  . famotidine (PEPCID) 20 MG tablet Take 1 tablet (20 mg total) by mouth 2 (two) times daily as needed for heartburn. 30 tablet 0  . furosemide (LASIX) 20 MG tablet TAKE 1 TABLET(20 MG) BY MOUTH DAILY 90 tablet 1  . ipratropium (ATROVENT) 0.02 % nebulizer solution Take 2.5 mLs (0.5 mg total) by nebulization 3 (three) times daily. 75 mL 12  . levalbuterol (XOPENEX) 0.63 MG/3ML nebulizer solution Take 3 mLs (0.63 mg total) by nebulization every 6 (six) hours as needed for wheezing or  shortness of breath. 3 mL 12  . metoprolol tartrate (LOPRESSOR) 50 MG tablet Take 1 tablet (50 mg total) by mouth 2 (two) times daily. 180 tablet 3  . nicotine (NICODERM CQ - DOSED IN MG/24 HOURS) 21 mg/24hr patch Place 1 patch (21 mg total) onto the skin daily. 28 patch 0  . XARELTO 20 MG TABS tablet TAKE 1 TABLET(20 MG) BY MOUTH DAILY WITH SUPPER 90 tablet 1  . guaiFENesin (MUCINEX) 600 MG 12 hr tablet Take 1 tablet (600 mg total) by mouth 2 (two) times daily. 30 tablet 0  . predniSONE (DELTASONE)  10 MG tablet Take 50mg  daily for 3days,Take 40mg  daily for 3days,Take 30mg  daily for 3days,Take 20mg  daily for 3days,Take 10mg  daily for 3days, then stop. 45 tablet 0   No facility-administered medications prior to visit.     Allergies:  Allergies  Allergen Reactions  . Codeine Nausea And Vomiting    Severe nausea    Social History   Socioeconomic History  . Marital status: Married    Spouse name: Not on file  . Number of children: 3  . Years of education: Not on file  . Highest education level: Not on file  Social Needs  . Financial resource strain: Not on file  . Food insecurity - worry: Not on file  . Food insecurity - inability: Not on file  . Transportation needs - medical: Not on file  . Transportation needs - non-medical: Not on file  Occupational History    Employer: [employer]  Tobacco Use  . Smoking status: Former Smoker    Packs/day: 0.20    Types: Cigarettes    Last attempt to quit: 07/15/2017    Years since quitting: 0.0  . Smokeless tobacco: Never Used  Substance and Sexual Activity  . Alcohol use: No  . Drug use: No  . Sexual activity: Not on file  Other Topics Concern  . Not on file  Social History Narrative   Entered 06/2014:   His wife was also a patient of mine ([facility])   She passed away in 2015--secondary to ongoing smoker, lung cancer.    At [facility] 06/2104 pt reports that one of their kids was already living with them--so he is not alone, even now .    Family History  Problem Relation Age of Onset  . Heart disease Mother   . COPD Father   . Cancer Father   . Cancer Sister 86       Breast Cancer  . Diabetes Brother      Review of Systems:  See HPI for pertinent ROS. All other ROS negative.    Physical Exam: Blood pressure (!) 142/86, pulse 67, temperature 97.7 F (36.5 C), temperature source Oral, resp. rate 16, weight 96.3 kg (212 lb 6.4 oz), SpO2 98 %., Body mass index is 28.02 kg/m. General: WM. Appears in no acute  distress. Neck: Supple. No thyromegaly. No lymphadenopathy. Lungs: Has distant breath sounds but his lungs are clear with no wheezes today.   Heart: Irregular Abdomen: Soft, non-tender, non-distended with normoactive bowel sounds. No hepatomegaly. No rebound/guarding. No obvious abdominal masses. Musculoskeletal:  Strength and tone normal for age. Extremities/Skin: Warm and dry.  No edema.  Neuro: Alert and oriented X 3. Moves all extremities spontaneously. Gait is normal. CNII-XII grossly in tact. Psych:  Responds to questions appropriately with a normal affect.     ASSESSMENT AND PLAN:  77 y.o. year old male with  1. Hospital discharge follow-up - Ambulatory  referral to Pulmonology  2. Essential hypertension  3. Peripheral vascular disease (HCC)  4. Paroxysmal atrial fibrillation ([place])  5. Atrial fibrillation with rapid ventricular response ([place])  6. COPD exacerbation ([place]) - Ambulatory referral to Pulmonology  7. Acute respiratory failure with hypoxia and hypercapnia (HCC) - Ambulatory referral to Pulmonology  8. Hyperlipidemia LDL goal <70  9. Smoker unmotivated to quit  We discussed that he had been scheduled to have colonoscopy by Dr. [name] 07/21/17.  When I saw Mr. Millspaugh for his visit and had him transferred to the hospital via EMS I called and notified Dr. Estell Harpin office. Today I have called Dr. Estell Harpin office and informed them that Mr. Cordial is now here for follow-up visit after his hospitalization that he is now stable and I will follow-up with getting him rescheduled for that colonoscopy.    Signed, [address] [place], [place], [facility] 08/09/2017 11:07 AM

## 2017-08-26 DIAGNOSIS — J9600 Acute respiratory failure, unspecified whether with hypoxia or hypercapnia: Secondary | ICD-10-CM | POA: Diagnosis not present

## 2017-08-26 DIAGNOSIS — J441 Chronic obstructive pulmonary disease with (acute) exacerbation: Secondary | ICD-10-CM | POA: Diagnosis not present

## 2017-09-03 ENCOUNTER — Encounter: Payer: Self-pay | Admitting: Internal Medicine

## 2017-09-03 ENCOUNTER — Ambulatory Visit: Payer: PPO | Admitting: Internal Medicine

## 2017-09-03 VITALS — BP 144/84 | HR 68 | Ht 73.0 in | Wt 213.8 lb

## 2017-09-03 DIAGNOSIS — J449 Chronic obstructive pulmonary disease, unspecified: Secondary | ICD-10-CM

## 2017-09-03 DIAGNOSIS — I10 Essential (primary) hypertension: Secondary | ICD-10-CM | POA: Diagnosis not present

## 2017-09-03 MED ORDER — GLYCOPYRROLATE-FORMOTEROL 9-4.8 MCG/ACT IN AERO: 2.0000 | INHALATION_SPRAY | Freq: Two times a day (BID) | RESPIRATORY_TRACT | 0 refills | Status: DC

## 2017-09-03 NOTE — Patient Instructions (Addendum)
No need for 02 at this point  Plan A = Automatic = Bevespi Take 2 puffs first thing in am and then another 2 puffs about 12 hours later.     Work on inhaler technique:  relax and gently blow all the way out then take a nice smooth deep breath back in, triggering the inhaler at same time you start breathing in.  Hold for up to 5 seconds if you can.  . Rinse and gargle with water when done      Plan B = Backup Only use your albuterol (RED = Proair) as a rescue medication to be used if you can't catch your breath by resting or doing a relaxed purse lip breathing pattern.  - The less you use it, the better it will work when you need it. - Ok to use the inhaler up to 2 puffs  every 4 hours if you must but call for appointment if use goes up over your usual need - Don't leave home without it !!  (think of it like the spare tire for your car)   Plan C = Crisis - only use your albuterol nebulizer if you first try Plan B and it fails to help > ok to use the nebulizer up to every 4 hours but if start needing it regularly call for immediate appointment   Plan D = Doctor - call me if B and C not adequate    Please schedule a follow up office visit in 4 weeks, sooner if needed with pfts and inhalers in hand

## 2017-09-03 NOTE — Progress Notes (Signed)
Subjective:     Patient ID: Antonio Wise, male   DOB: 04-24-1941,     MRN: [id number]  HPI  57 yowm quit smoking 07/2017 with indolent  doe x 2014 gradually worse esp since mid 2018 and no better since quit smoking though coughing a lot less/ referred to pulmonary clinic 09/03/2017 by Dena Billet with GOLD IV copd on initial eval.    09/03/2017 1st  Pulmonary office visit/ [name]   Chief Complaint  Patient presents with  . Pulmonary Consult    Referred by Dena Billet, NP.  Pt c/o SOB off and on for the past year.  He states most recently he stays SOB "most of the time"- with or without any exertion.  He gets winded walking to his mailbox.  He uses his albuterol inhaler 4 x per wk and he uses xopenex in his neb 2 x daily.  Has o2 at home but has not used in the past wk.   has not used BREO yet on day of ov Best days = MMRC3 = can't walk 100 yards even at a slow pace at a flat grade s stopping due to sob  Not really coughing much since quit smoking in 07/2017    In terms of doe,  no obvious day to day or daytime variability or assoc excess/ purulent sputum or mucus plugs or hemoptysis or cp or chest tightness, subjective wheeze or overt sinus or hb symptoms. No unusual exposure hx or h/o childhood pna/ asthma or knowledge of premature birth.  Sleeping ok flat without nocturnal  or early am exacerbation  of respiratory  c/o's or need for noct saba. Also denies any obvious fluctuation of symptoms with weather or environmental changes or other aggravating or alleviating factors except as outlined above   Current Allergies, Complete Past Medical History, Past Surgical History, Family History, and Social History were reviewed in [employer] record.  ROS  The following are not active complaints unless bolded Hoarseness, sore throat, dysphagia, dental problems, itching, sneezing,  nasal congestion or discharge of excess mucus or purulent secretions, ear ache,   fever, chills,  sweats, unintended wt loss or wt gain, classically pleuritic or exertional cp,  orthopnea pnd or leg swelling, presyncope, palpitations, abdominal pain, anorexia, nausea, vomiting, diarrhea  or change in bowel habits or change in bladder habits, change in stools or change in urine, dysuria, hematuria,  rash, arthralgias, visual complaints, headache, numbness, weakness or ataxia or problems with walking or coordination,  change in mood/affect or memory.        Current Meds  Medication Sig  . acetaminophen (TYLENOL) 500 MG tablet Take 500 mg by mouth every 6 (six) hours as needed for headache.  . albuterol (PROVENTIL HFA;VENTOLIN HFA) 108 (90 Base) MCG/ACT inhaler Inhale 1 puff into the lungs every 6 (six) hours as needed for wheezing or shortness of breath.  Marland Kitchen atorvastatin (LIPITOR) 40 MG tablet TAKE 1 TABLET(40 MG) BY MOUTH DAILY AT 6 PM  . BREO ELLIPTA 100-25 MCG/INH AEPB INHALE 1 PUFF DAILY (Patient taking differently: INHALE 1 PUFF BY INHALATION ONCE DAILY)  . Cholecalciferol 4000 units CAPS Take 1 capsule (4,000 Units total) by mouth daily.  Marland Kitchen diltiazem (CARDIZEM CD) 240 MG 24 hr capsule Take 2 capsules (480 mg total) by mouth daily. (Patient taking differently: Take 240 mg by mouth 2 (two) times daily. )  . famotidine (PEPCID) 20 MG tablet Take 1 tablet (20 mg total) by mouth 2 (two) times daily  as needed for heartburn.  . furosemide (LASIX) 20 MG tablet TAKE 1 TABLET(20 MG) BY MOUTH DAILY  . ipratropium (ATROVENT) 0.02 % nebulizer solution Take 2.5 mLs (0.5 mg total) by nebulization 3 (three) times daily.  Marland Kitchen levalbuterol (XOPENEX) 0.63 MG/3ML nebulizer solution Take 3 mLs (0.63 mg total) by nebulization every 6 (six) hours as needed for wheezing or shortness of breath.  . metoprolol tartrate (LOPRESSOR) 50 MG tablet Take 1 tablet (50 mg total) by mouth 2 (two) times daily.  Alveda Reasons 20 MG TABS tablet TAKE 1 TABLET(20 MG) BY MOUTH DAILY WITH SUPPER          Review of Systems      Objective:   Physical Exam    stoic barrel chested wm nad  Wt Readings from Last 3 Encounters:  09/03/17 213 lb 12.8 oz (97 kg)  08/09/17 212 lb 6.4 oz (96.3 kg)  07/23/17 203 lb (92.1 kg)     Vital signs reviewed - Note on arrival 02 sats  96% on RA  HEENT: nl dentition, turbinates bilaterally, and oropharynx. Nl external ear canals without cough reflex   NECK :  without JVD/Nodes/TM/ nl carotid upstrokes bilaterally   LUNGS: no acc muscle use,   barrel contour chest wall with bilateral  Distant bs  without cough on insp or exp maneuver and hyper  Resonant   to  percussion bilaterally     CV:  RRR  no s3 or murmur or increase in P2, and no edema   ABD:  soft and nontender with immediate hoover's sign on insp  in the supine position. No bruits or organomegaly appreciated, bowel sounds nl  MS:  Nl gait/ ext warm without deformities, calf tenderness, cyanosis or clubbing No obvious joint restrictions   SKIN: warm and dry without lesions    NEURO:  alert, approp, nl sensorium with  no motor or cerebellar deficits apparent.        I personally reviewed images and agree with radiology impression as follows:  pCXR:   07/19/17 Lungs mildly hyperexpanded with mild left base scarring. No edema or consolidation. Heart size normal.   Assessment:

## 2017-09-04 ENCOUNTER — Encounter: Payer: Self-pay | Admitting: Internal Medicine

## 2017-09-04 NOTE — Assessment & Plan Note (Addendum)
Quit smoking 07/2017 - Spirometry 09/03/2017  FEV1 0.79 (23%)  Ratio 44 s prior rx - 09/03/2017  After extensive coaching inhaler device  effectiveness =    75% (short Ti) > try bevespi 2bid  - 09/03/2017   Walked RA  2 laps @ 185 ft each stopped due to  [employer] / moderate pace, no desats   Severe copd >>>  Pt is Group B in terms of symptom/risk and laba/lama therefore appropriate rx at this point on a trial basis   Treatment other than smoking cessation  is entirely directed by severity of symptoms and focused also on reducing exacerbations, not attempting to change the natural history of the disease/ so emphasized importance of maintaining off cigs at all cost.    Total time devoted to counseling  > 50 % of initial 60 min office visit:  review case with pt/ discussion of options/alternatives/ personally creating written customized instructions  in presence of pt  then going over those specific  Instructions directly with the pt including how to use all of the meds but in particular covering each new medication in detail and the difference between the maintenance= "automatic" meds and the prns using an action plan format for the latter (If this problem/symptom => do that organization reading Left to right).  Please see AVS from this visit for a full list of these instructions which I personally wrote for this pt and  are unique to this visit.

## 2017-09-04 NOTE — Assessment & Plan Note (Signed)
He is on moderate doses of lopressor so might consider alternative like bisoprolol if not impressed with the benefits of bevespi or appears to have any "unstable" /asthmatic component when he returns for pfts in 6 weeks

## 2017-09-07 ENCOUNTER — Other Ambulatory Visit: Payer: Self-pay | Admitting: Physician Assistant

## 2017-09-07 NOTE — Telephone Encounter (Signed)
Refill appropriate 

## 2017-09-08 ENCOUNTER — Other Ambulatory Visit: Payer: Self-pay | Admitting: Gastroenterology

## 2017-09-23 DIAGNOSIS — J9600 Acute respiratory failure, unspecified whether with hypoxia or hypercapnia: Secondary | ICD-10-CM | POA: Diagnosis not present

## 2017-09-23 DIAGNOSIS — J441 Chronic obstructive pulmonary disease with (acute) exacerbation: Secondary | ICD-10-CM | POA: Diagnosis not present

## 2017-09-28 ENCOUNTER — Other Ambulatory Visit: Payer: Self-pay | Admitting: Gastroenterology

## 2017-09-29 ENCOUNTER — Telehealth: Payer: Self-pay

## 2017-09-29 NOTE — Telephone Encounter (Signed)
   [facility] Pre-operative Risk Assessment    Request for surgical clearance:  1. What type of surgery is being performed? Colonoscopy for rectal bleeding  2. When is this surgery scheduled? 10/14/17   3. What type of clearance is required (medical clearance vs. Pharmacy clearance to hold med vs. Both)? Pharmacy  4. Are there any medications that need to be held prior to surgery and how long? Xarelto not listed    5. Practice name and name of physician performing surgery? [facility], Dr. [name]  6. What is your office phone and fax number? [phone], fax: [phone]   7. Anesthesia type (None, local, MAC, general) ? Not Listed    Antonio Wise 09/29/2017, 10:42 AM  _________________________________________________________________   (provider comments below)

## 2017-09-30 NOTE — Telephone Encounter (Signed)
To pharm 

## 2017-09-30 NOTE — Telephone Encounter (Signed)
Patient with diagnosis of atrial fibrillation on XARELTO for anticoagulation.    Procedure: Colonoscopy Date of procedure: 10/14/2017  CHADS2-VASc score of  4 (HTN, AGE x 2, CAD)  CrCl = 69 ml/min Platelet count  = 342  Per office protocol, patient can hold XARELTO for 1 day prior to procedure.   Patient should restart XARELTO on the evening of procedure or at discretion of procedure MD    Antonio Wise PharmD, BCPS, [place] [address] [place] [address] 09/30/2017 8:30 PM

## 2017-10-01 ENCOUNTER — Telehealth: Payer: Self-pay | Admitting: Cardiology

## 2017-10-01 NOTE — Telephone Encounter (Signed)
   Primary Cardiologist: Quay Burow, MD  Chart reviewed as part of pre-operative protocol coverage. Given past medical history and time since last visit, based on ACC/AHA guidelines, LINDEN TAGLIAFERRO would be at acceptable risk for the planned procedure without further cardiovascular testing.   I spoke with the patient today, no change in his health since he was cleared in Jan. I explained he should hold his Xarelto the day before his colonoscopy and the day of and resume ASAP post procedure.   I will route this recommendation to the requesting party via Epic fax function and remove from pre-op pool.  Please call with questions.  Kerin Ransom, PA-C 10/01/2017, 2:30 PM

## 2017-10-07 ENCOUNTER — Other Ambulatory Visit: Payer: Self-pay | Admitting: Physician Assistant

## 2017-10-08 ENCOUNTER — Ambulatory Visit (INDEPENDENT_AMBULATORY_CARE_PROVIDER_SITE_OTHER): Payer: PPO | Admitting: Internal Medicine

## 2017-10-08 ENCOUNTER — Encounter: Payer: Self-pay | Admitting: Internal Medicine

## 2017-10-08 ENCOUNTER — Other Ambulatory Visit (INDEPENDENT_AMBULATORY_CARE_PROVIDER_SITE_OTHER): Payer: PPO

## 2017-10-08 ENCOUNTER — Ambulatory Visit: Payer: PPO | Admitting: Internal Medicine

## 2017-10-08 ENCOUNTER — Ambulatory Visit (INDEPENDENT_AMBULATORY_CARE_PROVIDER_SITE_OTHER)
Admission: RE | Admit: 2017-10-08 | Discharge: 2017-10-08 | Disposition: A | Discharge status: Home / Self Care | Payer: PPO | Source: Ambulatory Visit | Attending: Internal Medicine | Admitting: Internal Medicine

## 2017-10-08 VITALS — BP 140/78 | HR 92 | Temp 97.9°F | Ht 71.0 in | Wt 233.0 lb

## 2017-10-08 DIAGNOSIS — R0609 Other forms of dyspnea: Secondary | ICD-10-CM | POA: Diagnosis not present

## 2017-10-08 DIAGNOSIS — I2781 Cor pulmonale (chronic): Secondary | ICD-10-CM | POA: Diagnosis not present

## 2017-10-08 DIAGNOSIS — R0602 Shortness of breath: Secondary | ICD-10-CM | POA: Insufficient documentation

## 2017-10-08 DIAGNOSIS — J449 Chronic obstructive pulmonary disease, unspecified: Secondary | ICD-10-CM

## 2017-10-08 LAB — PULMONARY FUNCTION TEST
DL/VA % PRED: 47 %
DL/VA: 2.2 ml/min/mmHg/L
DLCO unc % pred: 102 %
DLCO unc: 34.55 ml/min/mmHg
FEF 25-75 POST: 0.46 L/s
FEF 25-75 Pre: 0.29 L/sec
FEF2575-%CHANGE-POST: 57 %
FEF2575-%PRED-PRE: 12 %
FEF2575-%Pred-Post: 20 %
FEV1-%Change-Post: 19 %
FEV1-%Pred-Post: 29 %
FEV1-%Pred-Pre: 24 %
FEV1-Post: 0.94 L
FEV1-Pre: 0.79 L
FEV1FVC-%CHANGE-POST: 3 %
FEV1FVC-%PRED-PRE: 61 %
FEV6-%Change-Post: 14 %
FEV6-%Pred-Post: 46 %
FEV6-%Pred-Pre: 40 %
FEV6-Post: 1.92 L
FEV6-Pre: 1.68 L
FEV6FVC-%Change-Post: 0 %
FEV6FVC-%Pred-Post: 101 %
FEV6FVC-%Pred-Pre: 102 %
FVC-%Change-Post: 15 %
FVC-%Pred-Post: 45 %
FVC-%Pred-Pre: 39 %
FVC-Post: 2.01 L
FVC-Pre: 1.74 L
PRE FEV1/FVC RATIO: 45 %
Post FEV1/FVC ratio: 47 %
Post FEV6/FVC ratio: 95 %
Pre FEV6/FVC Ratio: 96 %
RV % pred: 276 %
RV: 7.21 L
TLC % PRED: 128 %
TLC: 9.32 L

## 2017-10-08 LAB — CBC WITH DIFFERENTIAL/PLATELET
BASOS PCT: 1 % (ref 0.0–3.0)
Basophils Absolute: 0.2 10*3/uL — ABNORMAL HIGH (ref 0.0–0.1)
EOS PCT: 1 % (ref 0.0–5.0)
Eosinophils Absolute: 0.2 10*3/uL (ref 0.0–0.7)
HCT: 44.2 % (ref 39.0–52.0)
Hemoglobin: 14.3 g/dL (ref 13.0–17.0)
LYMPHS ABS: 5.2 10*3/uL — AB (ref 0.7–4.0)
Lymphocytes Relative: 28.4 % (ref 12.0–46.0)
MCHC: 32.3 g/dL (ref 30.0–36.0)
MCV: 95.8 fl (ref 78.0–100.0)
MONO ABS: 1.7 10*3/uL — AB (ref 0.1–1.0)
Monocytes Relative: 9.4 % (ref 3.0–12.0)
NEUTROS PCT: 60.2 % (ref 43.0–77.0)
Neutro Abs: 11 10*3/uL — ABNORMAL HIGH (ref 1.4–7.7)
Platelets: 349 10*3/uL (ref 150.0–400.0)
RBC: 4.61 Mil/uL (ref 4.22–5.81)
RDW: 14.7 % (ref 11.5–15.5)
WBC: 18.2 10*3/uL (ref 4.0–10.5)

## 2017-10-08 LAB — HEPATIC FUNCTION PANEL
ALBUMIN: 4.4 g/dL (ref 3.5–5.2)
ALK PHOS: 74 U/L (ref 39–117)
ALT: 18 U/L (ref 0–53)
AST: 19 U/L (ref 0–37)
BILIRUBIN TOTAL: 0.9 mg/dL (ref 0.2–1.2)
Bilirubin, Direct: 0.1 mg/dL (ref 0.0–0.3)
Total Protein: 7.2 g/dL (ref 6.0–8.3)

## 2017-10-08 LAB — BASIC METABOLIC PANEL
BUN: 18 mg/dL (ref 6–23)
CALCIUM: 9.6 mg/dL (ref 8.4–10.5)
CO2: 24 mEq/L (ref 19–32)
CREATININE: 1.48 mg/dL (ref 0.40–1.50)
Chloride: 100 mEq/L (ref 96–112)
GFR: 49.22 mL/min — ABNORMAL LOW (ref >60.00)
Glucose, Bld: 128 mg/dL — ABNORMAL HIGH (ref 70–99)
Potassium: 4.3 mEq/L (ref 3.5–5.1)
Sodium: 142 mEq/L (ref 135–145)

## 2017-10-08 LAB — TSH: TSH: 2.51 u[IU]/mL (ref 0.35–4.50)

## 2017-10-08 LAB — BRAIN NATRIURETIC PEPTIDE: PRO B NATRI PEPTIDE: 335 pg/mL — AB (ref 0.0–100.0)

## 2017-10-08 MED ORDER — SPIRONOLACTONE 25 MG PO TABS: 25.0000 mg | ORAL_TABLET | Freq: Two times a day (BID) | ORAL | 2 refills | Status: DC

## 2017-10-08 MED ORDER — FLUTICASONE-UMECLIDIN-VILANT 100-62.5-25 MCG/INH IN AEPB: 1.0000 | INHALATION_SPRAY | Freq: Every day | RESPIRATORY_TRACT | 0 refills | Status: DC

## 2017-10-08 NOTE — Progress Notes (Signed)
Subjective:     Patient ID: Antonio Wise, male   DOB: May 01, 1941,     MRN: [id number]    Brief patient profile:  77 yowm quit smoking 07/2017 with indolent  doe x 2014 gradually worse esp since mid 2018 and no better since quit smoking though coughing a lot less/ referred to pulmonary clinic 09/03/2017 by Dena Billet with GOLD IV copd on initial eval.    History of Present Illness  09/03/2017 1st  Pulmonary office visit/ Antonio Wise   Chief Complaint  Patient presents with  . Pulmonary Consult    Referred by Dena Billet, NP.  Pt c/o SOB off and on for the past year.  He states most recently he stays SOB "most of the time"- with or without any exertion.  He gets winded walking to his mailbox.  He uses his albuterol inhaler 4 x per wk and he uses xopenex in his neb 2 x daily.  Has o2 at home but has not used in the past wk.   has not used BREO yet on day of ov Best days = MMRC3 = can't walk 100 yards even at a slow pace at a flat grade s stopping due to sob  Not really coughing much since quit smoking in 07/2017  rec No need for 02 at this point Plan A = Automatic = Bevespi Take 2 puffs first thing in am and then another 2 puffs about 12 hours later.  Work on inhaler technique:  relax and gently blow all the way out then take a nice smooth deep breath back in, triggering the inhaler at same time you start breathing in.  Hold for up to 5 seconds if you can.  . Rinse and gargle with water when done Plan B = Backup Only use your albuterol (RED = Proair)  Plan C = Crisis - only use your albuterol nebulizer if you first try Plan B and it fails to help > ok to use the nebulizer up to every 4 hours but if start needing it regularly call for immediate appointment    10/08/2017  f/u ov/Antonio Wise re:  GOLD IV / has 02 but not using  Chief Complaint  Patient presents with  . Follow-up    PFT's done today. Pt c/o increased SOB and right arm swelling for the past wk.  His arm is painful.  He has gained 20 lbs  since last ov 09/03/17. He has not been using his albuterol inhaler, but has been using neb with atrovent tid.    Dyspnea:  Worse and needing neb tid/ = MMRC4  = sob if tries to leave home or while getting dressed   Cough: no Sleep: fine propped up 30 degrees/ no 02  SABA use:  As above/ no noct saba need   No obvious day to day or daytime variability or assoc excess/ purulent sputum or mucus plugs or hemoptysis or cp or chest tightness, subjective wheeze or overt sinus or hb symptoms. No unusual exposure hx or h/o childhood pna/ asthma or knowledge of premature birth.  Sleeping ok at 30 degrees  without nocturnal  or early am exacerbation  of respiratory  c/o's or need for noct saba. Also denies any obvious fluctuation of symptoms with weather or environmental changes or other aggravating or alleviating factors except as outlined above   Current Allergies, Complete Past Medical History, Past Surgical History, Family History, and Social History were reviewed in [employer] record.  ROS  The  following are not active complaints unless bolded Hoarseness, sore throat, dysphagia, dental problems, itching, sneezing,  nasal congestion or discharge of excess mucus or purulent secretions, ear ache,   fever, chills, sweats, unintended wt loss or wt gain, classically pleuritic or exertional cp,  orthopnea pnd or leg and arm swelling, presyncope, palpitations, abdominal pain, anorexia, nausea, vomiting, diarrhea  or change in bowel habits or change in bladder habits, change in stools or change in urine, dysuria, hematuria,  rash, arthralgias, visual complaints, headache, numbness, weakness or ataxia or problems with walking or coordination,  change in mood/affect or memory.        Current Meds  Medication Sig  . acetaminophen (TYLENOL) 500 MG tablet Take 500 mg by mouth every 6 (six) hours as needed for moderate pain or headache.   . albuterol (PROVENTIL HFA;VENTOLIN HFA) 108 (90 Base)  MCG/ACT inhaler Inhale 1 puff into the lungs every 6 (six) hours as needed for wheezing or shortness of breath.  Marland Kitchen atorvastatin (LIPITOR) 40 MG tablet TAKE 1 TABLET(40 MG) BY MOUTH DAILY AT 6 PM  . CARTIA XT 240 MG 24 hr capsule TAKE 2 CAPSULES(480 MG) BY MOUTH DAILY  . Cholecalciferol 4000 units CAPS Take 1 capsule (4,000 Units total) by mouth daily.  . famotidine (PEPCID) 20 MG tablet Take 1 tablet (20 mg total) by mouth 2 (two) times daily as needed for heartburn.  . furosemide (LASIX) 20 MG tablet TAKE 1 TABLET(20 MG) BY MOUTH DAILY  . ipratropium (ATROVENT) 0.02 % nebulizer solution Take 2.5 mLs (0.5 mg total) by nebulization 3 (three) times daily.  Marland Kitchen levalbuterol (XOPENEX) 0.63 MG/3ML nebulizer solution Take 3 mLs (0.63 mg total) by nebulization every 6 (six) hours as needed for wheezing or shortness of breath.  . metoprolol tartrate (LOPRESSOR) 50 MG tablet Take 1 tablet (50 mg total) by mouth 2 (two) times daily.  Alveda Reasons 20 MG TABS tablet TAKE 1 TABLET(20 MG) BY MOUTH DAILY WITH SUPPER  . [  Glycopyrrolate-Formoterol (BEVESPI AEROSPHERE) 9-4.8 MCG/ACT AERO Inhale 2 puffs into the lungs every 12 (twelve) hours.                   Objective:   Physical Exam  amb wm nad   10/08/2017         233    09/03/17 213 lb 12.8 oz (97 kg)  08/09/17 212 lb 6.4 oz (96.3 kg)  07/23/17 203 lb (92.1 kg)    Vital signs reviewed - Note on arrival 02 sats  94% on  RA         HEENT: nl dentition / oropharynx. Nl external ear canals without cough reflex - moderate bilateral non-specific turbinate edema     NECK :  without JVD/Nodes/TM/ nl carotid upstrokes bilaterally   LUNGS: no acc muscle use,  Mod barrel  contour chest wall with bilateral  Distant bs s audible wheeze and  without cough on insp or exp maneuver and mod   Hyperresonant  to  percussion bilaterally     CV:  RRR  no s3 or murmur or increase in P2, and asym swelling ue's/ 1+ pitting both loe   ABD:  soft and nontender  with pos mid insp Hoover's  in the supine position. No bruits or organomegaly appreciated, bowel sounds nl  MS:   Nl gait/  ext warm without deformities, calf tenderness, cyanosis or clubbing No obvious joint restrictions   SKIN: warm and dry without lesions    NEURO:  alert, approp, nl sensorium with  no motor or cerebellar deficits apparent.            CXR PA and Lateral:   10/08/2017 :    I personally reviewed images and agree with radiology impression as follows:    No acute disease. The chest is hyperexpanded consistent with COPD.     Labs ordered/ reviewed:      Chemistry      Component Value Date/Time   NA 142 10/08/2017 1148   K 4.3 10/08/2017 1148   CL 100 10/08/2017 1148   CO2 24 10/08/2017 1148   BUN 18 10/08/2017 1148   CREATININE 1.48 10/08/2017 1148   CREATININE 1.25 (H) 05/20/2017 0853      Component Value Date/Time   CALCIUM 9.6 10/08/2017 1148   ALKPHOS 74 10/08/2017 1148   AST 19 10/08/2017 1148   ALT 18 10/08/2017 1148   BILITOT 0.9 10/08/2017 1148        Lab Results  Component Value Date   WBC 18.2 Repeated and verified X2. (HH) 10/08/2017   HGB 14.3 10/08/2017   HCT 44.2 10/08/2017   MCV 95.8 10/08/2017   PLT 349.0 10/08/2017       EOS                                                               0.2                                     10/08/2017    Lab Results  Component Value Date   TSH 2.51 10/08/2017     Lab Results  Component Value Date   PROBNP 335.0 (H) 10/08/2017       Assessment:

## 2017-10-08 NOTE — Patient Instructions (Signed)
Use 2lpm as much as you can (this will help the fluid issue)  And elevate your arm as much as you can - if it doesn't go down see Dr Gwenlyn Found   Start aldactone 25 mg one twice daily   Stop Bevespi and start Trelegy one click each am   Plan A = Automatic = Trelegy  Plan B = Backup Only use your albuterol(PROAIR)  as a rescue medication to be used if you can't catch your breath by resting or doing a relaxed purse lip breathing pattern.  - The less you use it, the better it will work when you need it. - Ok to use the inhaler up to 2 puffs  every 4 hours if you must but call for appointment if use goes up over your usual need - Don't leave home without it !!  (think of it like the spare tire for your car)   Plan C = Crisis - only use your albuterol nebulizer if you first try Plan B and it fails to help > ok to use the nebulizer up to every 4 hours but if start needing it regularly call for immediate appointment   Please remember to go to the lab and x-ray department downstairs in the basement  for your tests - we will call you with the results when they are available.     Please schedule a follow up office visit in 2 weeks, sooner if needed  with all medications /inhalers/ solutions in hand so we can verify exactly what you are taking. This includes all medications from all doctors and over the counters

## 2017-10-08 NOTE — Progress Notes (Signed)
PFT completed today 10/08/17

## 2017-10-08 NOTE — Progress Notes (Signed)
Spoke with pt and notified of results per Dr. Wert. Pt verbalized understanding and denied any questions. 

## 2017-10-10 ENCOUNTER — Encounter: Payer: Self-pay | Admitting: Internal Medicine

## 2017-10-10 DIAGNOSIS — I2781 Cor pulmonale (chronic): Secondary | ICD-10-CM | POA: Insufficient documentation

## 2017-10-10 NOTE — Assessment & Plan Note (Signed)
C/w severe copd/ prob cor pulmonale (see separate a/p)

## 2017-10-10 NOTE — Assessment & Plan Note (Signed)
Echo 02/08/17  - Very technically difficult study. Definity contrast given. LVEF   is preserved at 60-65%, however, there appears to be significant   RV dysfunction and dilitation. The IVC is dilated and does not   collapse. Consider cardiac MRI to further assess cardiac function   and particularly RV function  - Added aldactone 25 mg bid due to gen swelling 10/08/2017 >>>  Also advised to wear 02 254/7 for now

## 2017-10-10 NOTE — Assessment & Plan Note (Addendum)
Quit smoking 07/2017 - Spirometry 09/03/2017  FEV1 0.79 (23%)  Ratio 44 s prior rx - 09/03/2017  After extensive coaching inhaler device  effectiveness =    75% (short Ti) > try bevespi 2bid   - 09/03/2017   Walked RA  2 laps @ 185 ft each stopped due to  Sob / moderate pace, no desats   - PFT's  10/08/2017  FEV1 0.94 (29 % ) ratio 47  p 19 % improvement from saba p Bevespi prior to study with DLCO  102 % corrects to 47  % for alv volume   - 10/08/2017  After extensive coaching inhaler device  effectiveness =    90% with elipta > try trelegy   Group D in terms of symptom/risk and laba/lama/ICS  therefore appropriate rx at this point so d/c bevespi and start trelegy   Formulary restrictions will be an ongoing challenge for the forseable future and I would be happy to pick an alternative if the pt will first  provide me a list of them but pt  will need to return here for training for any new device that is required eg dpi vs hfa vs respimat.    In meantime we can always provide samples so the patient never runs out of any needed respiratory medications = two week supply of Trelegy samples

## 2017-10-11 ENCOUNTER — Telehealth: Payer: Self-pay | Admitting: Internal Medicine

## 2017-10-11 NOTE — Telephone Encounter (Signed)
Pt is aware of below message and voiced his understanding. Nothing further is needed.  

## 2017-10-11 NOTE — Telephone Encounter (Signed)
I had already instructed him to see Dr Gwenlyn Found, his vascular specialist, if this was not improved with elevation of arm - make sure when he does see him he tells him about the new med we rec

## 2017-10-11 NOTE — Progress Notes (Signed)
Spoke with pt and notified of results per Dr. Wert. Pt verbalized understanding and denied any questions. 

## 2017-10-11 NOTE — Telephone Encounter (Signed)
Dr Melvyn Novas,  I called and gave him his cxr results  He states he is still having the swelling in his right arm and wants to know your thoughts on this  He is not having any new symptoms and states his breathing is unchanged  He states he has not had any fever  Please advise thanks

## 2017-10-12 ENCOUNTER — Telehealth: Payer: Self-pay | Admitting: Cardiovascular Disease

## 2017-10-12 NOTE — Telephone Encounter (Signed)
Returned call to patient who states he is experiencing worsening SOB with swelling in BL arms (R>L) and BL LE x 1 week.   States he has COPD and is having to take more breathing tx than normal and also noted he has gained approximately 20 lbs in the last 1-2 months.  Weight at OV 2/4 212, OV 4/5 233.    Does not weigh himself at home.   Denies increase salt intake.   States BP has been "normal", HR 80-90, O2 >90%.   States Dr. Melvyn Novas prescribed him spironolactone 25mg  bid on 4/5 which he has started but is not seeing improvement.  No SOB noted on phone, no distress.   Patient has OV tomorrow with Dr. Gwenlyn Found to discuss.     Also patient noted he had colonoscopy scheduled for 4/11 due to issues with GI bleeding, states they will not do this procedure until this is all taken care of.   Advised to discuss with Dr. Gwenlyn Found tomorrow at [facility].    Patient aware and verbalized understanding.

## 2017-10-12 NOTE — Telephone Encounter (Signed)
New Message:    Pt says his arms and legs are swelling and he is short of breath.I made an appointment for tomorrow with Dr Gwenlyn Found. Please call to evaluate.

## 2017-10-13 ENCOUNTER — Ambulatory Visit (INDEPENDENT_AMBULATORY_CARE_PROVIDER_SITE_OTHER): Payer: PPO | Admitting: Cardiovascular Disease

## 2017-10-13 ENCOUNTER — Encounter: Payer: Self-pay | Admitting: Cardiovascular Disease

## 2017-10-13 VITALS — BP 144/86 | HR 86 | Ht 73.0 in | Wt 240.2 lb

## 2017-10-13 DIAGNOSIS — I10 Essential (primary) hypertension: Secondary | ICD-10-CM

## 2017-10-13 DIAGNOSIS — Z79899 Other long term (current) drug therapy: Secondary | ICD-10-CM | POA: Diagnosis not present

## 2017-10-13 MED ORDER — FUROSEMIDE 20 MG PO TABS: ORAL_TABLET | ORAL | 1 refills | Status: DC

## 2017-10-13 NOTE — Assessment & Plan Note (Signed)
History of chronic A. Fib on beta blocker and calcium channel blocker for rate control and Xarelto  oral anticoagulation. He apparently has guaiac positive stool and was being scheduled for colonoscopy. I do not think he is a candidate for conscious sedation given his current pulmonary status.

## 2017-10-13 NOTE — Assessment & Plan Note (Signed)
History of hyperlipidemia on statin therapy with lipid profile performed 05/20/17 revealed total cholesterol 152, HDL 45 ventricles were level of 100.

## 2017-10-13 NOTE — Assessment & Plan Note (Signed)
History of remote iliac stent performed by myself 08/19/04 of his left common iliac artery. He denies claudication.

## 2017-10-13 NOTE — Assessment & Plan Note (Signed)
History essential hypertension blood pressure measured at 144/86. He is on Cartia and metoprolol. Continue current meds at current dosing.

## 2017-10-13 NOTE — Patient Instructions (Addendum)
Medication Instructions:  Your physician has recommended you make the following change in your medication:  1) TAKE your Lasix 40 mg, TWO Tablets, by mouth ONCE daily   Labwork: Your physician recommends that you return for lab work in: in 1 Week - BMET   Testing/Procedures: Your physician has requested that you have an echocardiogram. Echocardiography is a painless test that uses sound waves to create images of your heart. It provides your doctor with information about the size and shape of your heart and how well your heart's chambers and valves are working. This procedure takes approximately one hour. There are no restrictions for this procedure. BEFORE NEXT OFFICE VISIT WITH EXTENDER   Follow-Up: We request that you follow-up in: 1 MONTH with an extender and in 3 MONTHS with Dr Andria Rhein will receive a reminder letter in the mail two months in advance. If you don't receive a letter, please call our office to schedule the follow-up appointment.    Any Other Special Instructions Will Be Listed Below (If Applicable).     If you need a refill on your cardiac medications before your next appointment, please call your pharmacy.

## 2017-10-13 NOTE — Assessment & Plan Note (Signed)
Discontinued tobacco use in January 2019.

## 2017-10-13 NOTE — Progress Notes (Signed)
10/13/2017 Antonio Wise   15-Jun-1941  [id number]  Primary Physician [name], PA-C Primary Cardiologist: Lorretta Harp MD Lupe Carney, [place]  HPI:  Antonio Wise is a 77 y.o.  mildly overweight, married Caucasian male, father of three, grandfather to three grandchildren he was accompanied by one of his daughters today. I last saw him in the office 02/03/17.he has a history of peripheral vascular occlusive disease status post left common iliac artery PTA and stenting by myself August 19, 2004, for claudication. His symptoms resolved after that, and his Dopplers normalized. His other problems include discontinued tobacco abuse, only smoking two cigarettes a week, which is excellent for him. He has treated hypertension, dyslipidemia, and paroxysmal atrial fibrillation maintaining sinus rhythm on Rythmol. He has not had any A fib to his knowledge since I last saw him.   Since I saw him a year and a half ago he's done well until several weeks ago when he developed right flank pain and persistent fever.He probably had an MRI of his abdomen the results of which are unavailable to me at the current time. He underwent colonoscopy and was found to have "an infection in his colon". When I saw him a year ago he was in atrial fibrillation with a rapid ventricular response. I'll change his amlodipine to Cardizem at that point. He was also begun on Xarelto oral anti-coagulation. He is in chronic A. Fib rate controlled on high-dose Cartia and beta-blockade.  he was hospitalized for respiratory failure 07/19/17 for 4 days.his COPD seems to have worsened. He is on chronic home O2. He recently saw Dr. Melvyn Novas in the office and spironolactone was added. He mistakenly stopped his Lasix which has resulted in significant weight gain and worsening of his pulmonary status.his echo performed 02/08/17 revealed normal LV systolic function with a large hypokinetic RV consistent with cor pulmonale. We did have a  discussion with regards to his CODE STATUS and he does not wish to be intubated but was otherwise resuscitative measures. I suspect, at some point he will need to pursue palliative care given his steady decline.   Current Meds  Medication Sig  . acetaminophen (TYLENOL) 500 MG tablet Take 500 mg by mouth every 6 (six) hours as needed for moderate pain or headache.   . albuterol (PROVENTIL HFA;VENTOLIN HFA) 108 (90 Base) MCG/ACT inhaler Inhale 1 puff into the lungs every 6 (six) hours as needed for wheezing or shortness of breath.  Marland Kitchen atorvastatin (LIPITOR) 40 MG tablet TAKE 1 TABLET(40 MG) BY MOUTH DAILY AT 6 PM  . CARTIA XT 240 MG 24 hr capsule TAKE 2 CAPSULES(480 MG) BY MOUTH DAILY  . Cholecalciferol 4000 units CAPS Take 1 capsule (4,000 Units total) by mouth daily.  . famotidine (PEPCID) 20 MG tablet Take 1 tablet (20 mg total) by mouth 2 (two) times daily as needed for heartburn.  . Fluticasone-Umeclidin-Vilant (TRELEGY ELLIPTA) 100-62.5-25 MCG/INH AEPB Inhale 1 puff into the lungs daily.  . furosemide (LASIX) 20 MG tablet TAKE 1 TABLET(20 MG) BY MOUTH DAILY  . ipratropium (ATROVENT) 0.02 % nebulizer solution Take 2.5 mLs (0.5 mg total) by nebulization 3 (three) times daily.  Marland Kitchen levalbuterol (XOPENEX) 0.63 MG/3ML nebulizer solution Take 3 mLs (0.63 mg total) by nebulization every 6 (six) hours as needed for wheezing or shortness of breath.  . metoprolol tartrate (LOPRESSOR) 50 MG tablet Take 1 tablet (50 mg total) by mouth 2 (two) times daily.  Marland Kitchen spironolactone (ALDACTONE) 25 MG  tablet Take 1 tablet (25 mg total) by mouth 2 (two) times daily.  Alveda Reasons 20 MG TABS tablet TAKE 1 TABLET(20 MG) BY MOUTH DAILY WITH SUPPER     Allergies  Allergen Reactions  . Codeine Nausea And Vomiting and Other (See Comments)    Severe nausea    Social History   Socioeconomic History  . Marital status: Married    Spouse name: Not on file  . Number of children: 3  . Years of education: Not on file  .  Highest education level: Not on file  Occupational History    Employer: [employer]  . Financial resource strain: Not on file  . Food insecurity:    Worry: Not on file    Inability: Not on file  . Transportation needs:    Medical: Not on file    Non-medical: Not on file  Tobacco Use  . Smoking status: Former Smoker    Packs/day: 1.00    Years: 30.00    Pack years: 30.00    Types: Cigarettes    Last attempt to quit: 07/15/2017    Years since quitting: 0.2  . Smokeless tobacco: Never Used  Substance and Sexual Activity  . Alcohol use: No  . Drug use: No  . Sexual activity: Not on file  Lifestyle  . Physical activity:    Days per week: Not on file    Minutes per session: Not on file  . Stress: Not on file  Relationships  . Social connections:    Talks on phone: Not on file    Gets together: Not on file    Attends religious service: Not on file    Active member of club or organization: Not on file    Attends meetings of clubs or organizations: Not on file    Relationship status: Not on file  . Intimate partner violence:    Fear of current or ex partner: Not on file    Emotionally abused: Not on file    Physically abused: Not on file    Forced sexual activity: Not on file  Other Topics Concern  . Not on file  Social History Narrative   Entered 06/2014:   His wife was also a patient of mine ([facility])   She passed away in 2015--secondary to ongoing smoker, lung cancer.    At [place] 06/2104 pt reports that one of their kids was already living with them--so he is not alone, even now .     Review of Systems: General: negative for chills, fever, night sweats or weight changes.  Cardiovascular: negative for chest pain, dyspnea on exertion, edema, orthopnea, palpitations, paroxysmal nocturnal dyspnea or shortness of breath Dermatological: negative for rash Respiratory: negative for cough or wheezing Urologic: negative for hematuria Abdominal: negative for  nausea, vomiting, diarrhea, bright red blood per rectum, melena, or hematemesis Neurologic: negative for visual changes, syncope, or dizziness All other systems reviewed and are otherwise negative except as noted above.    Blood pressure (!) 144/86, pulse 86, height 6\' 1"  (1.854 m), weight 240 lb 3.2 oz (109 kg).  General appearance: alert and no distress Neck: no adenopathy, no carotid bruit, no JVD, supple, symmetrical, trachea midline and thyroid not enlarged, symmetric, no tenderness/mass/nodules Lungs: distant breath sounds without wheezes or crackles Heart: irregularly irregular rhythm Abdomen: protuberant Extremities: edema in his upper extremities out of proportion to his lower extremities. Pulses: 2+ and symmetric Skin: Skin color, texture, turgor normal. No rashes or lesions  Neurologic: Alert and oriented X 3, normal strength and tone. Normal symmetric reflexes. Normal coordination and gait  EKG not performed today.  ASSESSMENT AND PLAN:   Essential hypertension History essential hypertension blood pressure measured at 144/86. He is on Cartia and metoprolol. Continue current meds at current dosing.  Hyperlipidemia LDL goal <70 History of hyperlipidemia on statin therapy with lipid profile performed 05/20/17 revealed total cholesterol 152, HDL 45 ventricles were level of 100.  Smoker unmotivated to quit Discontinued tobacco use in January 2019.  Peripheral vascular disease History of remote iliac stent performed by myself 08/19/04 of his left common iliac artery. He denies claudication.  Acute respiratory failure with hypoxia and hypercapnia (HCC) History of chronic A. Fib on beta blocker and calcium channel blocker for rate control and Xarelto  oral anticoagulation. He apparently has guaiac positive stool and was being scheduled for colonoscopy. I do not think he is a candidate for conscious sedation given his current pulmonary status.  Chronic cor pulmonale  (HCC) History of cor pulmonale with echo performed 8 months ago that showed a large hypokinetic RV normal LV function. He has seen Dr. Melvyn Novas recently for worsening breathing. 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 Spironolactone was added other the patient on his own discontinue his furosemide which has resulted in progressive weight gain and worsening in his pulmonary function. I am going to add  Back furosemide at 40 mg a day and will check a basic metabolic panel in one week. He'll see mid-level provider back in one month and me back in 3 months. We had a talk about his CODE STATUS during his visit today. He does not wish to be intubated but was does wish to have other resuscitative measures performed His daughter was present during this decision. At some point in the near future, we may need to pursue palliative care.      Lorretta Harp MD FACP,FACC,FAHA, [facility] 10/13/2017 9:03 AM

## 2017-10-13 NOTE — Assessment & Plan Note (Signed)
History of cor pulmonale with echo performed 8 months ago that showed a large hypokinetic RV normal LV function. He has seen Dr. Melvyn Novas recently for worsening breathing. 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 Spironolactone was added other the patient on his own discontinue his furosemide which has resulted in progressive weight gain and worsening in his pulmonary function. I am going to add  Back furosemide at 40 mg a day and will check a basic metabolic panel in one week. He'll see mid-level provider back in one month and me back in 3 months. We had a talk about his CODE STATUS during his visit today. He does not wish to be intubated but was does wish to have other resuscitative measures performed His daughter was present during this decision. At some point in the near future, we may need to pursue palliative care.

## 2017-10-14 ENCOUNTER — Ambulatory Visit (HOSPITAL_COMMUNITY): Admission: RE | Admit: 2017-10-14 | Payer: PPO | Source: Ambulatory Visit | Admitting: Gastroenterology

## 2017-10-14 ENCOUNTER — Encounter (HOSPITAL_COMMUNITY): Admission: RE | Payer: Self-pay | Source: Ambulatory Visit

## 2017-10-14 SURGERY — COLONOSCOPY WITH PROPOFOL
Anesthesia: Monitor Anesthesia Care

## 2017-10-21 DIAGNOSIS — Z79899 Other long term (current) drug therapy: Secondary | ICD-10-CM | POA: Diagnosis not present

## 2017-10-21 DIAGNOSIS — I10 Essential (primary) hypertension: Secondary | ICD-10-CM | POA: Diagnosis not present

## 2017-10-21 LAB — BASIC METABOLIC PANEL
BUN/Creatinine Ratio: 15 (ref 10–24)
BUN: 21 mg/dL (ref 8–27)
CO2: 28 mmol/L (ref 20–29)
CREATININE: 1.38 mg/dL — AB (ref 0.76–1.27)
Calcium: 9.5 mg/dL (ref 8.6–10.2)
Chloride: 97 mmol/L (ref 96–106)
GFR calc Af Amer: 57 mL/min/{1.73_m2} — ABNORMAL LOW (ref >59)
GFR, EST NON AFRICAN AMERICAN: 50 mL/min/{1.73_m2} — AB (ref >59)
GLUCOSE: 112 mg/dL — AB (ref 65–99)
Potassium: 5 mmol/L (ref 3.5–5.2)
Sodium: 142 mmol/L (ref 134–144)

## 2017-10-24 DIAGNOSIS — J441 Chronic obstructive pulmonary disease with (acute) exacerbation: Secondary | ICD-10-CM | POA: Diagnosis not present

## 2017-10-24 DIAGNOSIS — J9600 Acute respiratory failure, unspecified whether with hypoxia or hypercapnia: Secondary | ICD-10-CM | POA: Diagnosis not present

## 2017-10-25 ENCOUNTER — Other Ambulatory Visit: Payer: Self-pay

## 2017-10-25 ENCOUNTER — Ambulatory Visit (HOSPITAL_COMMUNITY): Payer: PPO | Attending: Internal Medicine

## 2017-10-25 DIAGNOSIS — E785 Hyperlipidemia, unspecified: Secondary | ICD-10-CM | POA: Diagnosis not present

## 2017-10-25 DIAGNOSIS — I10 Essential (primary) hypertension: Secondary | ICD-10-CM | POA: Diagnosis not present

## 2017-10-25 DIAGNOSIS — I4891 Unspecified atrial fibrillation: Secondary | ICD-10-CM | POA: Diagnosis not present

## 2017-10-25 DIAGNOSIS — Z720 Tobacco use: Secondary | ICD-10-CM | POA: Diagnosis not present

## 2017-10-25 DIAGNOSIS — Z79899 Other long term (current) drug therapy: Secondary | ICD-10-CM | POA: Insufficient documentation

## 2017-10-25 DIAGNOSIS — I071 Rheumatic tricuspid insufficiency: Secondary | ICD-10-CM | POA: Diagnosis not present

## 2017-10-26 ENCOUNTER — Encounter: Payer: Self-pay | Admitting: Internal Medicine

## 2017-10-26 ENCOUNTER — Ambulatory Visit: Payer: PPO | Admitting: Internal Medicine

## 2017-10-26 VITALS — BP 132/70 | HR 75 | Ht 73.0 in | Wt 231.0 lb

## 2017-10-26 DIAGNOSIS — I2781 Cor pulmonale (chronic): Secondary | ICD-10-CM

## 2017-10-26 DIAGNOSIS — J449 Chronic obstructive pulmonary disease, unspecified: Secondary | ICD-10-CM | POA: Diagnosis not present

## 2017-10-26 DIAGNOSIS — J9611 Chronic respiratory failure with hypoxia: Secondary | ICD-10-CM

## 2017-10-26 MED ORDER — FLUTICASONE-UMECLIDIN-VILANT 100-62.5-25 MCG/INH IN AEPB: 1.0000 | INHALATION_SPRAY | Freq: Every day | RESPIRATORY_TRACT | 0 refills | Status: DC

## 2017-10-26 MED ORDER — ALBUTEROL SULFATE HFA 108 (90 BASE) MCG/ACT IN AERS: INHALATION_SPRAY | RESPIRATORY_TRACT | Status: DC

## 2017-10-26 MED ORDER — ALBUTEROL SULFATE HFA 108 (90 BASE) MCG/ACT IN AERS: INHALATION_SPRAY | RESPIRATORY_TRACT | 11 refills | Status: AC

## 2017-10-26 MED ORDER — FLUTICASONE-UMECLIDIN-VILANT 100-62.5-25 MCG/INH IN AEPB: 1.0000 | INHALATION_SPRAY | Freq: Every day | RESPIRATORY_TRACT | 11 refills | Status: DC

## 2017-10-26 NOTE — Patient Instructions (Addendum)
Use 2lpm as much as you can (this will help the fluid issue)  And elevate your arm as much as you can -follow up with Dr Gwenlyn Found    Plan A = Automatic = Trelegy  Work on inhaler technique:  relax and gently blow all the way out then take a nice smooth deep breath back in   Hold for up to 5 seconds if you can. Blow out thru nose. Rinse and gargle with water when done     Plan B = Backup Only use your albuterol(PROAIR)  as a rescue medication to be used if you can't catch your breath by resting or doing a relaxed purse lip breathing pattern.  - The less you use it, the better it will work when you need it. - Ok to use the inhaler up to 2 puffs  every 4 hours if you must but call for appointment if use goes up over your usual need - Don't leave home without it !!  (think of it like the spare tire for your car)    Plan C = Crisis - only use your levoalbuterol nebulizer (Yellow box)  if you first try Plan B and it fails to help > ok to use the nebulizer up to every 4 hours but if start needing it regularly call for immediate appointment     Please schedule a follow up visit in 3 months but call sooner if needed

## 2017-10-26 NOTE — Progress Notes (Signed)
Subjective:     Patient ID: Antonio Wise, male   DOB: [birth year],     MRN: [id number]    Brief patient profile:  77 yowm quit smoking 07/2017 with indolent  doe x 2014 gradually worse esp since mid 2018 and no better since quit smoking though coughing a lot less/ referred to pulmonary clinic 09/03/2017 by Antonio Wise with GOLD IV copd on initial eval.     History of Present Illness  09/03/2017 1st [facility] office visit/ Antonio Wise   Chief Complaint  Patient presents with  . Pulmonary Consult    Referred by Antonio Billet, NP.  Pt c/o SOB off and on for the past year.  He states most recently he stays SOB "most of the time"- with or without any exertion.  He gets winded walking to his mailbox.  He uses his albuterol inhaler 4 x per wk and he uses xopenex in his neb 2 x daily.  Has o2 at home but has not used in the past wk.   has not used BREO yet on day of ov Best days = MMRC3 = can't walk 100 yards even at a slow pace at a flat grade s stopping due to sob  Not really coughing much since quit smoking in 07/2017  rec No need for 02 at this point Plan A = Automatic = Bevespi Take 2 puffs first thing in am and then another 2 puffs about 12 hours later.  Work on inhaler technique:  relax and gently blow all the way out then take a nice smooth deep breath back in, triggering the inhaler at same time you start breathing in.  Hold for up to 5 seconds if you can.  . Rinse and gargle with water when done Plan B = Backup Only use your albuterol (RED = Proair)  Plan C = Crisis - only use your albuterol nebulizer if you first try Plan B and it fails to help > ok to use the nebulizer up to every 4 hours but if start needing it regularly call for immediate appointment    10/08/2017  f/u ov/Antonio Wise re:  GOLD IV / has 02 but not using  Chief Complaint  Patient presents with  . Follow-up    PFT's done today. Pt c/o increased SOB and right arm swelling for the past wk.  His arm is painful.  He has gained 20 lbs  since last ov 09/03/17. He has not been using his albuterol inhaler, but has been using neb with atrovent tid.   Dyspnea:  Worse and needing neb tid/ = MMRC4  = sob if tries to leave home or while getting dressed   Cough: no Sleep: fine propped up 30 degrees/ no 02  SABA use:  As above/ no noct saba need rec Use 2lpm as much as you can (this will help the fluid issue)  And elevate your arm as much as you can - if it doesn't go down see Dr Antonio Wise  Start aldactone 25 mg one twice daily  Stop Bevespi and start Trelegy one click each am  Plan A = Automatic = Trelegy Plan B = Backup Only use your albuterol(PROAIR)  as a rescue medication   Plan C = Crisis - only use your albuterol nebulizer if you first try Plan B and it fails to help > ok to use the nebulizer up to every 4 hours but if start needing it regularly call for immediate appointment    10/26/2017  f/u  ov/Antonio Wise re: copd IV/ 02 2lpm at rest / sleeping and  up to 3lpm with activity / brought meds and daugher and still confused with interpreting written instructions from last ov/ has not yet used trelegy this am  Chief Complaint  Patient presents with  . Follow-up    Breathing is doing about the same. His arm is still swollen and painful.   Dyspnea:  Improved but  still very inactive = MMRC3 = can't walk 100 yards even at a slow pace at a flat grade s stopping due to sob   Cough: not a problem Sleep: better propped up 30 degrees on 2lpm  SABA use:   Still too much saba hfa and neb    No obvious day to day or daytime variability or assoc excess/ purulent sputum or mucus plugs or hemoptysis or cp or chest tightness, subjective wheeze or overt sinus or hb symptoms. No unusual exposure hx or h/o childhood pna/ asthma or knowledge of premature birth.  Sleeping  propped up 30 degrees on 2lpm   without nocturnal  or early am exacerbation  of respiratory  c/o's or need for noct saba. Also denies any obvious fluctuation of symptoms with weather or  environmental changes or other aggravating or alleviating factors except as outlined above   Current Allergies, Complete Past Medical History, Past Surgical History, Family History, and Social History were reviewed in [employer] record.  ROS  The following are not active complaints unless bolded Hoarseness, sore throat, dysphagia, dental problems, itching, sneezing,  nasal congestion or discharge of excess mucus or purulent secretions, ear ache,   fever, chills, sweats, unintended wt loss or wt gain, classically pleuritic or exertional cp,  orthopnea pnd or arm/hand swelling  or leg swelling, presyncope, palpitations, abdominal pain, anorexia, nausea, vomiting, diarrhea  or change in bowel habits or change in bladder habits, change in stools or change in urine, dysuria, hematuria,  rash, arthralgias, visual complaints, headache, numbness, weakness or ataxia or problems with walking or coordination,  change in mood or  memory.        Current Meds  Medication Sig  . acetaminophen (TYLENOL) 500 MG tablet Take 500 mg by mouth every 6 (six) hours as needed for moderate pain or headache.   Marland Kitchen atorvastatin (LIPITOR) 40 MG tablet TAKE 1 TABLET(40 MG) BY MOUTH DAILY AT 6 PM  . CARTIA XT 240 MG 24 hr capsule TAKE 2 CAPSULES(480 MG) BY MOUTH DAILY  . Cholecalciferol 4000 units CAPS Take 1 capsule (4,000 Units total) by mouth daily.  . famotidine (PEPCID) 20 MG tablet Take 1 tablet (20 mg total) by mouth 2 (two) times daily as needed for heartburn.  . Fluticasone-Umeclidin-Vilant (TRELEGY ELLIPTA) 100-62.5-25 MCG/INH AEPB Inhale 1 puff into the lungs daily.  . furosemide (LASIX) 20 MG tablet TAKE 2 TABLETS (40 MG) BY MOUTH DAILY  . levalbuterol (XOPENEX) 0.63 MG/3ML nebulizer solution Take 3 mLs (0.63 mg total) by nebulization every 6 (six) hours as needed for wheezing or shortness of breath.  . metoprolol tartrate (LOPRESSOR) 50 MG tablet Take 1 tablet (50 mg total) by mouth 2 (two)  times daily.  . OXYGEN 2lpm 24/7  AHC  . spironolactone (ALDACTONE) 25 MG tablet Take 1 tablet (25 mg total) by mouth 2 (two) times daily.  Antonio Wise 20 MG TABS tablet TAKE 1 TABLET(20 MG) BY MOUTH DAILY WITH SUPPER  . [DISCONTINUED] albuterol (PROVENTIL HFA;VENTOLIN HFA) 108 (90 Base) MCG/ACT inhaler Inhale 1 puff into the lungs  every 6 (six) hours as needed for wheezing or shortness of breath.  .     .    . [  ipratropium (ATROVENT) 0.02 % nebulizer solution Take 2.5 mLs (0.5 mg total) by nebulization 3 (three) times daily.                         Objective:   Physical Exam  amb anxious wm nad    10/26/2017       231  10/08/2017         233    09/03/17 213 lb 12.8 oz (97 kg)  08/09/17 212 lb 6.4 oz (96.3 kg)  07/23/17 203 lb (92.1 kg)    Vital signs reviewed - Note on arrival 02 sats  100% on 2lpm continous       HEENT: nl dentition / oropharynx. Nl external ear canals without cough reflex - moderate bilateral non-specific turbinate edema     NECK :  without JVD/Nodes/TM/ nl carotid upstrokes bilaterally   LUNGS: no acc muscle use,  Mod barrel  contour chest wall with bilateral  Distant bs s audible wheeze and  without cough on insp or exp maneuver and mod   Hyperresonant  to  percussion bilaterally     CV:  RRR  no s3 or murmur or increase in P2, and no edema though RUE does seem a bit larger than L   ABD:  soft and nontender with pos mid insp Hoover's  in the supine position. No bruits or organomegaly appreciated, bowel sounds nl  MS:   Nl gait/  ext warm without deformities, calf tenderness, cyanosis or clubbing No obvious joint restrictions   SKIN: warm and dry without lesions    NEURO:  alert, approp, nl sensorium with  no motor or cerebellar deficits apparent.                Assessment:

## 2017-10-28 ENCOUNTER — Encounter: Payer: Self-pay | Admitting: Internal Medicine

## 2017-10-28 DIAGNOSIS — J9611 Chronic respiratory failure with hypoxia: Secondary | ICD-10-CM | POA: Insufficient documentation

## 2017-10-28 NOTE — Assessment & Plan Note (Signed)
rx is 02 (see separate a/p)  And diuretic rx per Dr Gwenlyn Found including monitoring renal function/K  On aldactone /pt and daughter advised

## 2017-10-28 NOTE — Assessment & Plan Note (Addendum)
Quit smoking 07/2017 - Spirometry 09/03/2017  FEV1 0.79 (23%)  Ratio 44 s prior rx - 09/03/2017  After extensive coaching inhaler device  effectiveness =    75% (short Ti) > try bevespi 2bid   - 09/03/2017   Walked RA  2 laps @ 185 ft each stopped due to  Sob / moderate pace, no desats  - PFT's  10/08/2017  FEV1 0.94 (29 % ) ratio 47  p 19 % improvement from saba p Bevespi prior to study with DLCO  102 % corrects to 47  % for alv volume   - 10/08/2017    try trelegy  - 10/26/2017  After extensive coaching inhaler device  effectiveness =    90% with elipta, 75% with hfa   DDX of  difficult airways management almost all start with A and  include Adherence, Ace Inhibitors, Acid Reflux, Active Sinus Disease, Alpha 1 Antitripsin deficiency, Anxiety masquerading as Airways dz,  ABPA,  Allergy(esp in young), Aspiration (esp in elderly), Adverse effects of meds,  Active smokers, A bunch of PE's (a small clot burden can't cause this syndrome unless there is already severe underlying pulm or vascular dz with poor reserve) plus two Bs  = Bronchiectasis and Beta blocker use..and one C= CHF  Adherence is always the initial "prime suspect" and is a multilayered concern that requires a "trust but verify" approach in every patient - starting with knowing how to use medications, especially inhalers, correctly, keeping up with refills and understanding the fundamental difference between maintenance and prns vs those medications only taken for a very short course and then stopped and not refilled.  - see hfa teaching - still struggling between maint/automatics vs prns - enlisted daughter's help - return with all meds in hand using a trust but verify approach to confirm accurate Medication  Reconciliation The principal here is that until we are certain that the  patients are doing what we've asked, it makes no sense to ask them to do more.   ? Acid (or non-acid) GERD > always difficult to exclude as up to 75% of pts in some series  report no assoc GI/ Heartburn symptoms> rec continue max (24h)  acid suppression and diet restrictions/ reviewed   ? A bunch of PE's > unlikely  on xarelto  ? Active smoking > denies/ repeated importance of maintaining abstinence    ? BB effects > ideally  In the setting of respiratory symptoms of unknown etiology,  It would be preferable to use bystolic, the most beta -1  selective Beta blocker available in sample form, with bisoprolol the most selective generic choice  on the market, at least on a trial basis, to make sure the spillover Beta 2 effects of the less specific Beta blockers are not contributing to this patient's symptoms.   ? CHF >  BNP nl 10/08/17 but likely worsening cor pulmonale > rx is 02 (see separate a/p)  And diuretic rx per Dr Gwenlyn Found including monitoring renal function/K  On aldactone /pt and daughter advised    I had an extended discussion with the patient reviewing all relevant studies completed to date and  lasting 15 to 20 minutes of a 25 minute visit    Each maintenance medication was reviewed in detail including most importantly the difference between maintenance and prns and under what circumstances the prns are to be triggered using an action plan format that is not reflected in the computer generated alphabetically organized AVS.    Please  see AVS for specific instructions unique to this visit that I personally wrote and verbalized to the the pt in detail and then reviewed with pt  by my nurse highlighting any  changes in therapy recommended at today's visit to their plan of care.

## 2017-10-28 NOTE — Assessment & Plan Note (Signed)
rx  Is 2lpm at rest/ sleep and 3lpm with activity as of  10/26/2017     Adequate control on present rx, reviewed in detail with pt > no change in rx needed

## 2017-11-03 ENCOUNTER — Other Ambulatory Visit: Payer: Self-pay | Admitting: Physician Assistant

## 2017-11-16 ENCOUNTER — Encounter: Payer: Self-pay | Admitting: Cardiology

## 2017-11-16 ENCOUNTER — Ambulatory Visit (INDEPENDENT_AMBULATORY_CARE_PROVIDER_SITE_OTHER): Payer: PPO | Admitting: Cardiology

## 2017-11-16 VITALS — BP 122/70 | HR 76 | Ht 72.0 in | Wt 228.0 lb

## 2017-11-16 DIAGNOSIS — I482 Chronic atrial fibrillation, unspecified: Secondary | ICD-10-CM

## 2017-11-16 DIAGNOSIS — I10 Essential (primary) hypertension: Secondary | ICD-10-CM

## 2017-11-16 DIAGNOSIS — I739 Peripheral vascular disease, unspecified: Secondary | ICD-10-CM | POA: Diagnosis not present

## 2017-11-16 DIAGNOSIS — I4819 Other persistent atrial fibrillation: Secondary | ICD-10-CM

## 2017-11-16 DIAGNOSIS — Z7901 Long term (current) use of anticoagulants: Secondary | ICD-10-CM

## 2017-11-16 DIAGNOSIS — I481 Persistent atrial fibrillation: Secondary | ICD-10-CM

## 2017-11-16 DIAGNOSIS — J449 Chronic obstructive pulmonary disease, unspecified: Secondary | ICD-10-CM | POA: Diagnosis not present

## 2017-11-16 LAB — BASIC METABOLIC PANEL
BUN/Creatinine Ratio: 19 (ref 10–24)
BUN: 29 mg/dL — ABNORMAL HIGH (ref 8–27)
CO2: 25 mmol/L (ref 20–29)
Calcium: 9.7 mg/dL (ref 8.6–10.2)
Chloride: 98 mmol/L (ref 96–106)
Creatinine, Ser: 1.53 mg/dL — ABNORMAL HIGH (ref 0.76–1.27)
GFR calc Af Amer: 51 mL/min/{1.73_m2} — ABNORMAL LOW (ref >59)
GFR calc non Af Amer: 44 mL/min/{1.73_m2} — ABNORMAL LOW (ref >59)
Glucose: 113 mg/dL — ABNORMAL HIGH (ref 65–99)
Potassium: 5 mmol/L (ref 3.5–5.2)
Sodium: 140 mmol/L (ref 134–144)

## 2017-11-16 LAB — CBC
Hematocrit: 40.2 % (ref 37.5–51.0)
Hemoglobin: 13.7 g/dL (ref 13.0–17.7)
MCH: 31 pg (ref 26.6–33.0)
MCHC: 34.1 g/dL (ref 31.5–35.7)
MCV: 91 fL (ref 79–97)
Platelets: 342 10*3/uL (ref 150–379)
RBC: 4.42 x10E6/uL (ref 4.14–5.80)
RDW: 14.2 % (ref 12.3–15.4)
WBC: 11.4 10*3/uL — ABNORMAL HIGH (ref 3.4–10.8)

## 2017-11-16 NOTE — Patient Instructions (Signed)
Medication Instructions:   NO CHANGE  Labwork:  Your physician recommends that you HAVE LAB WORK TODAY  Follow-Up:  Your physician recommends that you schedule a follow-up appointment in: [address]   If you need a refill on your cardiac medications before your next appointment, please call your pharmacy.

## 2017-11-16 NOTE — Assessment & Plan Note (Signed)
Severe COPD- chronic O2

## 2017-11-16 NOTE — Progress Notes (Signed)
11/16/2017 Antonio Wise   Sep 24, 1940  [id number]  Primary Physician [name], PA-C Primary Cardiologist: Dr Gwenlyn Found  HPI:  77 y/o male followed by Dr Gwenlyn Found with a history of PVD- s/p LCIA PTA in 2006, CAF on Xarelto, and COPD. His  his echo performed 02/08/17 revealed normal LV systolic function with a large hypokinetic RV consistent with cor pulmonale. Dr Gwenlyn Found saw him 10/13/17. The patient has had some rectal bleeding and needs a colonoscopy.  Dr Gwenlyn Found did not think he was  a candidate for conscious sedation given his current pulmonary status. He added back Lasix 40 mg daily and the patient is here today for follow up. He is in a wheel chair on O2. Overall he feels better, he wants to get the colonoscopy done.    Current Outpatient Medications  Medication Sig Dispense Refill  . acetaminophen (TYLENOL) 500 MG tablet Take 500 mg by mouth every 6 (six) hours as needed for moderate pain or headache.     . albuterol (PROAIR HFA) 108 (90 Base) MCG/ACT inhaler 2 puffs every 4 hours as needed only  if your can't catch your breath 1 Inhaler 11  . atorvastatin (LIPITOR) 40 MG tablet TAKE 1 TABLET(40 MG) BY MOUTH DAILY AT 6 PM 90 tablet 3  . CARTIA XT 240 MG 24 hr capsule TAKE 2 CAPSULES(480 MG) BY MOUTH DAILY 60 capsule 0  . Cholecalciferol 4000 units CAPS Take 1 capsule (4,000 Units total) by mouth daily. 30 capsule 3  . famotidine (PEPCID) 20 MG tablet Take 1 tablet (20 mg total) by mouth 2 (two) times daily as needed for heartburn. 30 tablet 0  . Fluticasone-Umeclidin-Vilant (TRELEGY ELLIPTA) 100-62.5-25 MCG/INH AEPB Inhale 1 puff into the lungs daily. 1 each 11  . furosemide (LASIX) 20 MG tablet TAKE 2 TABLETS (40 MG) BY MOUTH DAILY 90 tablet 1  . levalbuterol (XOPENEX) 0.63 MG/3ML nebulizer solution Take 3 mLs (0.63 mg total) by nebulization every 6 (six) hours as needed for wheezing or shortness of breath. 3 mL 12  . metoprolol tartrate (LOPRESSOR) 50 MG tablet Take 1 tablet (50 mg total) by  mouth 2 (two) times daily. 180 tablet 3  . OXYGEN 2lpm 24/7  AHC    . spironolactone (ALDACTONE) 25 MG tablet Take 1 tablet (25 mg total) by mouth 2 (two) times daily. 60 tablet 2  . XARELTO 20 MG TABS tablet TAKE 1 TABLET(20 MG) BY MOUTH DAILY WITH SUPPER 90 tablet 1   No current facility-administered medications for this visit.     Allergies  Allergen Reactions  . Codeine Nausea And Vomiting and Other (See Comments)    Severe nausea    Past Medical History:  Diagnosis Date  . Colon polyp 05/06/2008   repeat 5 years  . COPD (chronic obstructive pulmonary disease) ([place])   . Duodenal ulcer 05/06/2008   EGD  . Elevated PSA   . Gastric ulcer 05/06/2008   EGD  . H/O Helicobacter infection 05/06/2008   EGD  . H/O: GI bleed   . History of nuclear stress test 02/19/2010   bruce protocol; mild-mod perfusion defect in spetal region consistent with attenuation artifact; no significant ischemia demonstrated; dysrhythmia during exercise  . Hyperlipidemia   . Hypertension   . Peripheral vascular disease ([place])    a. s/p left common iliac artery stenting in 2006  . Persistent atrial fibrillation ([place])    a. on Xarelto  . Smoker unmotivated to quit   . Vitamin D  deficiency     Social History   Socioeconomic History  . Marital status: Married    Spouse name: Not on file  . Number of children: 3  . Years of education: Not on file  . Highest education level: Not on file  Occupational History    Employer: [employer]  . Financial resource strain: Not on file  . Food insecurity:    Worry: Not on file    Inability: Not on file  . Transportation needs:    Medical: Not on file    Non-medical: Not on file  Tobacco Use  . Smoking status: Former Smoker    Packs/day: 1.00    Years: 30.00    Pack years: 30.00    Types: Cigarettes    Last attempt to quit: 07/15/2017    Years since quitting: 0.3  . Smokeless tobacco: Never Used  Substance and Sexual Activity  . Alcohol  use: No  . Drug use: No  . Sexual activity: Not on file  Lifestyle  . Physical activity:    Days per week: Not on file    Minutes per session: Not on file  . Stress: Not on file  Relationships  . Social connections:    Talks on phone: Not on file    Gets together: Not on file    Attends religious service: Not on file    Active member of club or organization: Not on file    Attends meetings of clubs or organizations: Not on file    Relationship status: Not on file  . Intimate partner violence:    Fear of current or ex partner: Not on file    Emotionally abused: Not on file    Physically abused: Not on file    Forced sexual activity: Not on file  Other Topics Concern  . Not on file  Social History Narrative   Entered 06/2014:   His wife was also a patient of mine ([facility])   She passed away in 2015--secondary to ongoing smoker, lung cancer.    At [place] 06/2104 pt reports that one of their kids was already living with them--so he is not alone, even now .     Family History  Problem Relation Age of Onset  . Heart disease Mother   . COPD Father        smoked and worked in a [employer]  . Cancer Father   . Cancer Sister 26       Breast Cancer  . Diabetes Brother      Review of Systems: General: negative for chills, fever, night sweats or weight changes.  Cardiovascular: negative for chest pain, dyspnea on exertion, edema, orthopnea, palpitations, paroxysmal nocturnal dyspnea or shortness of breath Dermatological: negative for rash Respiratory: negative for cough or wheezing Urologic: negative for hematuria Abdominal: negative for nausea, vomiting, diarrhea, bright red blood per rectum, melena, or hematemesis Neurologic: negative for visual changes, syncope, or dizziness All other systems reviewed and are otherwise negative except as noted above.    Blood pressure 122/70, pulse 76, height 6' (1.829 m), weight 228 lb (103.4 kg).  General appearance: alert, cooperative, no  distress, mildly obese and in a wheel chair, on O2 Neck: no carotid bruit and no JVD Lungs: decreased breath sounds Heart: irregularly irregular rhythm Extremities: no edema Skin: Skin color, texture, turgor normal. No rashes or lesions Neurologic: Grossly normal   ASSESSMENT AND PLAN:   Chronic atrial fibrillation (HCC) Rate controlled  Essential  hypertension Controlled  Peripheral vascular disease H/O LCIA PTA 2006  COPD  GOLD IV  Severe COPD- chronic O2  Current use of long term anticoagulation On Xarelto, CHADS VASC=5   PLAN  Reviewed with pharmacist- hold Xarelto 24 hrs pre colonoscopy, resume as soon as possible post op. . I reviewed this with Dr Gwenlyn Found. The pt does seemed improved with the addition of Lasix and Dr Gwenlyn Found thinks it OK to proceed with colonoscopy.  I will forward to Dr Collene Mares.   Kerin Ransom PA-C 11/16/2017 4:42 PM

## 2017-11-16 NOTE — Assessment & Plan Note (Signed)
On Xarelto, CHADS VASC=5

## 2017-11-16 NOTE — Assessment & Plan Note (Signed)
H/O LCIA PTA 2006

## 2017-11-16 NOTE — Assessment & Plan Note (Signed)
Controlled.  

## 2017-11-16 NOTE — Assessment & Plan Note (Signed)
Rate controlled 

## 2017-11-17 ENCOUNTER — Ambulatory Visit: Payer: PPO | Admitting: Physician Assistant

## 2017-11-23 DIAGNOSIS — J441 Chronic obstructive pulmonary disease with (acute) exacerbation: Secondary | ICD-10-CM | POA: Diagnosis not present

## 2017-11-23 DIAGNOSIS — J9600 Acute respiratory failure, unspecified whether with hypoxia or hypercapnia: Secondary | ICD-10-CM | POA: Diagnosis not present

## 2017-12-02 ENCOUNTER — Other Ambulatory Visit: Payer: Self-pay | Admitting: Physician Assistant

## 2017-12-02 ENCOUNTER — Other Ambulatory Visit: Payer: Self-pay | Admitting: Cardiovascular Disease

## 2017-12-02 DIAGNOSIS — I10 Essential (primary) hypertension: Secondary | ICD-10-CM

## 2017-12-02 DIAGNOSIS — R0602 Shortness of breath: Secondary | ICD-10-CM

## 2017-12-02 NOTE — Telephone Encounter (Signed)
Rx sent to pharmacy   

## 2017-12-24 DIAGNOSIS — J441 Chronic obstructive pulmonary disease with (acute) exacerbation: Secondary | ICD-10-CM | POA: Diagnosis not present

## 2017-12-24 DIAGNOSIS — J9600 Acute respiratory failure, unspecified whether with hypoxia or hypercapnia: Secondary | ICD-10-CM | POA: Diagnosis not present

## 2017-12-27 ENCOUNTER — Other Ambulatory Visit: Payer: Self-pay | Admitting: Internal Medicine

## 2018-01-05 ENCOUNTER — Other Ambulatory Visit: Payer: Self-pay | Admitting: Physician Assistant

## 2018-01-12 ENCOUNTER — Ambulatory Visit: Payer: PPO | Admitting: Cardiovascular Disease

## 2018-01-12 ENCOUNTER — Encounter: Payer: Self-pay | Admitting: Cardiovascular Disease

## 2018-01-12 VITALS — BP 129/74 | HR 74 | Ht 73.0 in | Wt 232.0 lb

## 2018-01-12 DIAGNOSIS — I10 Essential (primary) hypertension: Secondary | ICD-10-CM

## 2018-01-12 DIAGNOSIS — I482 Chronic atrial fibrillation, unspecified: Secondary | ICD-10-CM

## 2018-01-12 DIAGNOSIS — I739 Peripheral vascular disease, unspecified: Secondary | ICD-10-CM | POA: Diagnosis not present

## 2018-01-12 DIAGNOSIS — F17200 Nicotine dependence, unspecified, uncomplicated: Secondary | ICD-10-CM | POA: Diagnosis not present

## 2018-01-12 DIAGNOSIS — E785 Hyperlipidemia, unspecified: Secondary | ICD-10-CM

## 2018-01-12 NOTE — Progress Notes (Signed)
01/12/2018 Antonio Wise   12-27-1940  [id number]  Primary Physician [name], PA-C Primary Cardiologist: Lorretta Harp MD Lupe Carney, [place]  HPI:  Antonio Wise is a 77 y.o.  mildly overweight, married Caucasian male, father of three, grandfather to three grandchildren he was accompanied by one of his daughters , Antonio Wise, today. I last saw him in the office 10/13/2017..he has a history of peripheral vascular occlusive disease status post left common iliac artery PTA and stenting by myself August 19, 2004, for claudication. His symptoms resolved after that, and his Dopplers normalized. His other problems include discontinued tobacco abuse, only smoking two cigarettes a week, which is excellent for him. He has treated hypertension, dyslipidemia, and paroxysmal atrial fibrillation maintaining sinus rhythm on Rythmol. He has not had any A fib to his knowledge since I last saw him.   Since I saw him a year and a half ago he's done well until several weeks ago when he developed right flank pain and persistent fever.He probably had an MRI of his abdomen the results of which are unavailable to me at the current time. He underwent colonoscopy and was found to have "an infection in his colon".When I saw him a year ago he was in atrial fibrillation with a rapid ventricular response. I'll change his amlodipine to Cardizem at that point. He was also begun on Xarelto oral anti-coagulation. He is in chronic A. Fib rate controlled on high-dose Cartia and beta-blockade.  he was hospitalized for respiratory failure 07/19/17 for 4 days.his COPD seems to have worsened. He is on chronic home O2. He recently saw Dr. Melvyn Novas in the office and spironolactone was added. He mistakenly stopped his Lasix which has resulted in significant weight gain and worsening of his pulmonary status.his echo performed 02/08/17 revealed normal LV systolic function with a large hypokinetic RV consistent with cor pulmonale. We  did have a discussion with regards to his CODE STATUS and he does not wish to be intubated but was otherwise resuscitative measures. I suspect, at some point he will need to pursue palliative care given his steady decline.  Since I saw him in the office he has had another 2D echocardiogram performed 10/25/2017 revealing normal LV systolic function, mildly dilated left atrium, moderate pulmonary artery hypertension with a PA peak pressure 42 mmHg.  The RV was not well visualized.  His shortness of breath is chronic and stable.  He is somewhat confined to his wheelchair.  He apparently needs a colonoscopy because of bleeding from his rectum .  He has had a colonoscopy in the past without complication.   Current Meds  Medication Sig  . acetaminophen (TYLENOL) 500 MG tablet Take 500 mg by mouth every 6 (six) hours as needed for moderate pain or headache.   . albuterol (PROAIR HFA) 108 (90 Base) MCG/ACT inhaler 2 puffs every 4 hours as needed only  if your can't catch your breath  . atorvastatin (LIPITOR) 40 MG tablet TAKE 1 TABLET(40 MG) BY MOUTH DAILY AT 6 PM  . Cholecalciferol 4000 units CAPS Take 1 capsule (4,000 Units total) by mouth daily.  Marland Kitchen diltiazem (CARDIZEM CD) 240 MG 24 hr capsule TAKE 2 CAPSULES(480 MG) BY MOUTH DAILY  . famotidine (PEPCID) 20 MG tablet Take 1 tablet (20 mg total) by mouth 2 (two) times daily as needed for heartburn.  . Fluticasone-Umeclidin-Vilant (TRELEGY ELLIPTA) 100-62.5-25 MCG/INH AEPB Inhale 1 puff into the lungs daily.  . furosemide (LASIX) 20 MG  tablet TAKE 2 TABLETS (40 MG) BY MOUTH DAILY  . levalbuterol (XOPENEX) 0.63 MG/3ML nebulizer solution Take 3 mLs (0.63 mg total) by nebulization every 6 (six) hours as needed for wheezing or shortness of breath.  . metoprolol tartrate (LOPRESSOR) 50 MG tablet Take 1 tablet (50 mg total) by mouth 2 (two) times daily.  . OXYGEN 2lpm 24/7  [facility]  . spironolactone (ALDACTONE) 25 MG tablet TAKE 1 TABLET(25 MG) BY MOUTH TWICE DAILY    . XARELTO 20 MG TABS tablet TAKE 1 TABLET(20 MG) BY MOUTH DAILY WITH SUPPER     Allergies  Allergen Reactions  . Codeine Nausea And Vomiting and Other (See Comments)    Severe nausea    Social History   Socioeconomic History  . Marital status: Married    Spouse name: Not on file  . Number of children: 3  . Years of education: Not on file  . Highest education level: Not on file  Occupational History    Employer: [employer]  . Financial resource strain: Not on file  . Food insecurity:    Worry: Not on file    Inability: Not on file  . Transportation needs:    Medical: Not on file    Non-medical: Not on file  Tobacco Use  . Smoking status: Former Smoker    Packs/day: 1.00    Years: 30.00    Pack years: 30.00    Types: Cigarettes    Last attempt to quit: 07/15/2017    Years since quitting: 0.4  . Smokeless tobacco: Never Used  Substance and Sexual Activity  . Alcohol use: No  . Drug use: No  . Sexual activity: Not on file  Lifestyle  . Physical activity:    Days per week: Not on file    Minutes per session: Not on file  . Stress: Not on file  Relationships  . Social connections:    Talks on phone: Not on file    Gets together: Not on file    Attends religious service: Not on file    Active member of club or organization: Not on file    Attends meetings of clubs or organizations: Not on file    Relationship status: Not on file  . Intimate partner violence:    Fear of current or ex partner: Not on file    Emotionally abused: Not on file    Physically abused: Not on file    Forced sexual activity: Not on file  Other Topics Concern  . Not on file  Social History Narrative   Entered 06/2014:   His wife was also a patient of mine ([facility])   She passed away in 2015--secondary to ongoing smoker, lung cancer.    At [place] 06/2104 pt reports that one of their kids was already living with them--so he is not alone, even now .     Review of  Systems: General: negative for chills, fever, night sweats or weight changes.  Cardiovascular: negative for chest pain, dyspnea on exertion, edema, orthopnea, palpitations, paroxysmal nocturnal dyspnea or shortness of breath Dermatological: negative for rash Respiratory: negative for cough or wheezing Urologic: negative for hematuria Abdominal: negative for nausea, vomiting, diarrhea, bright red blood per rectum, melena, or hematemesis Neurologic: negative for visual changes, syncope, or dizziness All other systems reviewed and are otherwise negative except as noted above.    Blood pressure 129/74, pulse 74, height 6\' 1"  (1.854 m), weight 232 lb (105.2 kg).  General appearance:  alert and no distress Neck: no adenopathy, no carotid bruit, no JVD, supple, symmetrical, trachea midline and thyroid not enlarged, symmetric, no tenderness/mass/nodules Lungs: clear to auscultation bilaterally Heart: irregularly irregular rhythm Extremities: extremities normal, atraumatic, no cyanosis or edema Pulses: 2+ and symmetric Skin: Skin color, texture, turgor normal. No rashes or lesions Neurologic: Alert and oriented X 3, normal strength and tone. Normal symmetric reflexes. Normal coordination and gait  EKG atrial fibrillation with a ventricular response of 74 and low limb voltage with septal Q waves.  I personally reviewed this EKG.  ASSESSMENT AND PLAN:   Essential hypertension History of essential hypertension with blood pressure measured today at 129/74.  He is on diltiazem, and metoprolol.  Continue current meds at current dosing.  Hyperlipidemia LDL goal <70 History of hyperlipidemia on statin therapy with lipid profile performed 05/20/2017 revealing total cholesterol 152, triglyceride level 100 and HDL of 45.  Smoker unmotivated to quit Long history of tobacco abuse having quit in January of this year.  He does have COPD with cor pulmonale, moderate pulmonary hypertension on chronic home  oxygen.  Peripheral vascular disease History of peripheral vascular disease status post left common iliac artery stenting by myself 08/19/2004 for claudication.  He currently is minimally ambulatory and denies claudication.  He has been couple years since his last Doppler studies.  Chronic atrial fibrillation (HCC) History of chronic A. fib rate controlled on Xarelto oral anticoagulation.  Apparently, he is having bleeding from his rectum and needs a colonoscopy.  He will be able to stop his Xarelto 2 days prior to the procedure and restart after his colonoscopy procedure.      Lorretta Harp MD FACP,FACC,FAHA, [facility] 01/12/2018 9:26 AM

## 2018-01-12 NOTE — Assessment & Plan Note (Signed)
History of peripheral vascular disease status post left common iliac artery stenting by myself 08/19/2004 for claudication.  He currently is minimally ambulatory and denies claudication.  He has been couple years since his last Doppler studies.

## 2018-01-12 NOTE — Assessment & Plan Note (Signed)
History of essential hypertension with blood pressure measured today at 129/74.  He is on diltiazem, and metoprolol.  Continue current meds at current dosing.

## 2018-01-12 NOTE — Assessment & Plan Note (Signed)
Long history of tobacco abuse having quit in January of this year.  He does have COPD with cor pulmonale, moderate pulmonary hypertension on chronic home oxygen.

## 2018-01-12 NOTE — Assessment & Plan Note (Signed)
History of hyperlipidemia on statin therapy with lipid profile performed 05/20/2017 revealing total cholesterol 152, triglyceride level 100 and HDL of 45.

## 2018-01-12 NOTE — Assessment & Plan Note (Addendum)
History of chronic A. fib rate controlled on Xarelto oral anticoagulation.  Apparently, he is having bleeding from his rectum and needs a colonoscopy.  He will be able to stop his Xarelto 2 days prior to the procedure and restart after his colonoscopy procedure.

## 2018-01-12 NOTE — Patient Instructions (Addendum)
Medication Instructions: Your physician recommends that you continue on your current medications as directed. Please refer to the Current Medication list given to you today.  If you need a refill on your cardiac medications before your next appointment, please call your pharmacy.   Follow-Up: Your physician wants you to follow-up in 3 months with Kerin Ransom, PA and 6 months with Dr. Gwenlyn Found.    Special Instructions: You have been cleared for your colonoscopy by cardiology.  Please hold Xarelto 2 days prior.   Thank you for choosing [facility] at [facility]!!

## 2018-01-23 DIAGNOSIS — J441 Chronic obstructive pulmonary disease with (acute) exacerbation: Secondary | ICD-10-CM | POA: Diagnosis not present

## 2018-01-23 DIAGNOSIS — J9600 Acute respiratory failure, unspecified whether with hypoxia or hypercapnia: Secondary | ICD-10-CM | POA: Diagnosis not present

## 2018-01-25 ENCOUNTER — Ambulatory Visit: Payer: PPO | Admitting: Internal Medicine

## 2018-01-25 ENCOUNTER — Encounter: Payer: Self-pay | Admitting: Internal Medicine

## 2018-01-25 VITALS — BP 136/78 | HR 107 | Ht 73.0 in | Wt 231.6 lb

## 2018-01-25 DIAGNOSIS — I2781 Cor pulmonale (chronic): Secondary | ICD-10-CM | POA: Diagnosis not present

## 2018-01-25 DIAGNOSIS — J449 Chronic obstructive pulmonary disease, unspecified: Secondary | ICD-10-CM | POA: Diagnosis not present

## 2018-01-25 DIAGNOSIS — J9611 Chronic respiratory failure with hypoxia: Secondary | ICD-10-CM | POA: Diagnosis not present

## 2018-01-25 MED ORDER — FLUTICASONE-UMECLIDIN-VILANT 100-62.5-25 MCG/INH IN AEPB: 1.0000 | INHALATION_SPRAY | Freq: Every day | RESPIRATORY_TRACT | 0 refills | Status: AC

## 2018-01-25 NOTE — Assessment & Plan Note (Signed)
Quit smoking 07/2017 - Spirometry 09/03/2017  FEV1 0.79 (23%)  Ratio 44 s prior rx - 09/03/2017  After extensive coaching inhaler device  effectiveness =    75% (short Ti) > try bevespi 2bid   - 09/03/2017   Walked RA  2 laps @ 185 ft each stopped due to  Sob / moderate pace, no desats  - PFT's  10/08/2017  FEV1 0.94 (29 % ) ratio 47  p 19 % improvement from saba p Bevespi prior to study with DLCO  102 % corrects to 47  % for alv volume   - 10/08/2017    try trelegy   - 01/25/2018  After extensive coaching inhaler device  effectiveness =  75% (short Ti)  100% with elipta       Group D in terms of symptom/risk and laba/lama/ICS  therefore appropriate rx at this point though still over using saba and very poor insight into conditioning issues.  rec reconditioning/ paced ex on 02 and strongly consider rehab but he wants to wait.   I had an extended discussion with the patient reviewing all relevant studies completed to date and  lasting 15 to 20 minutes of a 25 minute visit    See device teaching which extended face to face time for this visit.  Each maintenance medication was reviewed in detail including emphasizing most importantly the difference between maintenance and prns and under what circumstances the prns are to be triggered using an action plan format that is not reflected in the computer generated alphabetically organized AVS which I have not found useful in most complex patients, especially with respiratory illnesses  Please see AVS for specific instructions unique to this visit that I personally wrote and verbalized to the the pt in detail and then reviewed with pt  by my nurse highlighting any  changes in therapy recommended at today's visit to their plan of care.

## 2018-01-25 NOTE — Patient Instructions (Signed)
Work on inhaler technique:  relax and gently blow all the way out then take a nice smooth deep breath back in, triggering the inhaler at same time you start breathing in.  Hold for up to 5 seconds if you can. Blow trelegy  out thru nose. Rinse and gargle with water when done  Call me when you are ready to consider pulmonary rehab   Please schedule a follow up visit in 6  months but call sooner if needed

## 2018-01-25 NOTE — Assessment & Plan Note (Signed)
Echo 02/08/17  - Very technically difficult study. Definity contrast given. LVEF   is preserved at 60-65%, however, there appears to be significant   RV dysfunction and dilitation. The IVC is dilated and does not   collapse. Consider cardiac MRI to further assess cardiac function   and particularly RV function - Added aldactone 25 mg bid due to gen swelling 10/08/2017 >>>improved/ f/u diuretic rx per Dr Gwenlyn Found    Adequate control on present rx, reviewed in detail with pt > no change in rx needed

## 2018-01-25 NOTE — Progress Notes (Signed)
Subjective:     Patient ID: Antonio Wise, male   DOB: [birth year],     MRN: [id number]    Brief patient profile:  77 yowm quit smoking 07/2017 with indolent  doe x 2014 gradually worse esp since mid 2018 and no better since quit smoking though coughing a lot less/ referred to pulmonary clinic 09/03/2017 by Dena Billet with GOLD IV copd on initial eval.     History of Present Illness  09/03/2017 1st [facility] office visit/ Antonio Wise   Chief Complaint  Patient presents with  . Pulmonary Consult    Referred by Dena Billet, NP.  Pt c/o SOB off and on for the past year.  He states most recently he stays SOB "most of the time"- with or without any exertion.  He gets winded walking to his mailbox.  He uses his albuterol inhaler 4 x per wk and he uses xopenex in his neb 2 x daily.  Has o2 at home but has not used in the past wk.   has not used BREO yet on day of ov Best days = MMRC3 = can't walk 100 yards even at a slow pace at a flat grade s stopping due to sob  Not really coughing much since quit smoking in 07/2017  rec No need for 02 at this point Plan A = Automatic = Bevespi Take 2 puffs first thing in am and then another 2 puffs about 12 hours later.  Work on inhaler technique:  relax and gently blow all the way out then take a nice smooth deep breath back in, triggering the inhaler at same time you start breathing in.  Hold for up to 5 seconds if you can.  . Rinse and gargle with water when done Plan B = Backup Only use your albuterol (RED = Proair)  Plan C = Crisis - only use your albuterol nebulizer if you first try Plan B and it fails to help > ok to use the nebulizer up to every 4 hours but if start needing it regularly call for immediate appointment    10/08/2017  f/u ov/Antonio Wise re:  GOLD IV / has 02 but not using  Chief Complaint  Patient presents with  . Follow-up    PFT's done today. Pt c/o increased SOB and right arm swelling for the past wk.  His arm is painful.  He has gained 20 lbs  since last ov 09/03/17. He has not been using his albuterol inhaler, but has been using neb with atrovent tid.   Dyspnea:  Worse and needing neb tid/ = MMRC4  = sob if tries to leave home or while getting dressed   Cough: no Sleep: fine propped up 30 degrees/ no 02  SABA use:  As above/ no noct saba need rec Use 2lpm as much as you can (this will help the fluid issue)  And elevate your arm as much as you can - if it doesn't go down see Dr Gwenlyn Found  Start aldactone 25 mg one twice daily  Stop Bevespi and start Trelegy one click each am  Plan A = Automatic = Trelegy Plan B = Backup Only use your albuterol(PROAIR)  as a rescue medication   Plan C = Crisis - only use your albuterol nebulizer if you first try Plan B and it fails to help > ok to use the nebulizer up to every 4 hours but if start needing it regularly call for immediate appointment    10/26/2017  f/u  ov/Antonio Wise re: copd IV/ 02 2lpm at rest / sleeping and  up to 3lpm with activity / brought meds and daugher and still confused with interpreting written instructions from last ov/ has not yet used trelegy this am  Chief Complaint  Patient presents with  . Follow-up    Breathing is doing about the same. His arm is still swollen and painful.   Dyspnea:  Improved but  still very inactive = MMRC3 = can't walk 100 yards even at a slow pace at a flat grade s stopping due to sob   Cough: not a problem Sleep: better propped up 30 degrees on 2lpm  SABA use:   Still too much saba hfa and neb  rec Use 2lpm as much as you can (this will help the fluid issue)  And elevate your arm as much as you can -follow up with Dr Gwenlyn Found   Plan A = Automatic = Trelegy Work on inhaler technique:  relax and gently blow all the way out then take a nice smooth deep breath back in   Hold for up to 5 seconds if you can. Blow out thru nose. Rinse and gargle with water when done Plan B = Backup Only use your albuterol(PROAIR)  Plan C = Crisis - only use your levoalbuterol  nebulizer (Yellow box)  if you first try Plan B     01/25/2018  f/u ov/Antonio Wise re:  GOLD IV / 02 2lpm 24/7 x at rest sats ok RA Chief Complaint  Patient presents with  . Follow-up    Breathing improved since the last visit. He is using his proair once daily on average and xopenex neb 3 x per wk.   Dyspnea:  MMRC3 = can't walk 100 yards even at a slow pace at a flat grade s stopping due to sob   Cough: none  SABA use: maybe once a day  W/in an hour of trelegy  02: 2lpm  Hs  No obvious day to day or daytime variability or assoc excess/ purulent sputum or mucus plugs or hemoptysis or cp or chest tightness, subjective wheeze or overt sinus or hb symptoms.   Sleeping: 2lpm 2 pillows flat bed  without nocturnal  or early am exacerbation  of respiratory  c/o's or need for noct saba. Also denies any obvious fluctuation of symptoms with weather or environmental changes or other aggravating or alleviating factors except as outlined above   No unusual exposure hx or h/o childhood pna/ asthma or knowledge of premature birth.  Current Allergies, Complete Past Medical History, Past Surgical History, Family History, and Social History were reviewed in [employer] record.  ROS  The following are not active complaints unless bolded Hoarseness, sore throat, dysphagia, dental problems, itching, sneezing,  nasal congestion or discharge of excess mucus or purulent secretions, ear ache,   fever, chills, sweats, unintended wt loss or wt gain, classically pleuritic or exertional cp,  orthopnea pnd or arm/hand swelling  or leg swelling, presyncope, palpitations, abdominal pain, anorexia, nausea, vomiting, diarrhea  or change in bowel habits or change in bladder habits, change in stools or change in urine, dysuria, hematuria,  rash, arthralgias, visual complaints, headache, numbness, weakness or ataxia or problems with walking or coordination,  change in mood or  memory.        Current Meds   Medication Sig  . acetaminophen (TYLENOL) 500 MG tablet Take 500 mg by mouth every 6 (six) hours as needed for moderate pain or headache.   Marland Kitchen  albuterol (PROAIR HFA) 108 (90 Base) MCG/ACT inhaler 2 puffs every 4 hours as needed only  if your can't catch your breath  . atorvastatin (LIPITOR) 40 MG tablet TAKE 1 TABLET(40 MG) BY MOUTH DAILY AT 6 PM  . Cholecalciferol 4000 units CAPS Take 1 capsule (4,000 Units total) by mouth daily.  Marland Kitchen diltiazem (CARDIZEM CD) 240 MG 24 hr capsule TAKE 2 CAPSULES(480 MG) BY MOUTH DAILY  . famotidine (PEPCID) 20 MG tablet Take 1 tablet (20 mg total) by mouth 2 (two) times daily as needed for heartburn.  . Fluticasone-Umeclidin-Vilant (TRELEGY ELLIPTA) 100-62.5-25 MCG/INH AEPB Inhale 1 puff into the lungs daily.  . furosemide (LASIX) 20 MG tablet TAKE 2 TABLETS (40 MG) BY MOUTH DAILY  . levalbuterol (XOPENEX) 0.63 MG/3ML nebulizer solution Take 3 mLs (0.63 mg total) by nebulization every 6 (six) hours as needed for wheezing or shortness of breath.  . metoprolol tartrate (LOPRESSOR) 50 MG tablet Take 1 tablet (50 mg total) by mouth 2 (two) times daily.  . OXYGEN 2lpm 24/7  AHC  . spironolactone (ALDACTONE) 25 MG tablet TAKE 1 TABLET(25 MG) BY MOUTH TWICE DAILY  . XARELTO 20 MG TABS tablet TAKE 1 TABLET(20 MG) BY MOUTH DAILY WITH SUPPER  .                 Objective:   Physical Exam  W/c bound wm nad   01/25/2018      231  10/26/2017       231  10/08/2017         233    09/03/17 213 lb 12.8 oz (97 kg)  08/09/17 212 lb 6.4 oz (96.3 kg)  07/23/17 203 lb (92.1 kg)     Vital signs reviewed - Note on arrival 02 sats  99% on 2lpm       HEENT: nl dentition / oropharynx. Nl external ear canals without cough reflex - moderate bilateral non-specific turbinate edema     NECK :  without JVD/Nodes/TM/ nl carotid upstrokes bilaterally   LUNGS: no acc muscle use,  Mod barrel  contour chest wall with bilateral  Distant bs s audible wheeze and  without cough on  insp or exp maneuver and mod   Hyperresonant  to  percussion bilaterally     CV:  RRR  no s3 or murmur or increase in P2, and no edema   ABD:  Obese soft and nontender with pos mid insp Hoover's   No bruits or organomegaly appreciated, bowel sounds nl  MS:    ext warm without deformities, calf tenderness, cyanosis or clubbing No obvious joint restrictions   SKIN: warm and dry without lesions    NEURO:  alert, approp, nl sensorium with  no motor or cerebellar deficits apparent.              Assessment:

## 2018-01-25 NOTE — Assessment & Plan Note (Signed)
rx  Is 2lpm at rest/ sleep and 3lpm with activity as of  01/25/2018   Says sats ok at rest on RA so reviewed if takes 02 off needs to monitor sats in view of dx of cor pulmonale

## 2018-01-26 ENCOUNTER — Other Ambulatory Visit: Payer: Self-pay | Admitting: Cardiovascular Disease

## 2018-02-04 ENCOUNTER — Other Ambulatory Visit: Payer: Self-pay | Admitting: Physician Assistant

## 2018-02-09 ENCOUNTER — Encounter: Payer: Self-pay | Admitting: Physician Assistant

## 2018-02-09 ENCOUNTER — Ambulatory Visit (INDEPENDENT_AMBULATORY_CARE_PROVIDER_SITE_OTHER): Payer: PPO | Admitting: Physician Assistant

## 2018-02-09 ENCOUNTER — Other Ambulatory Visit: Payer: Self-pay

## 2018-02-09 VITALS — BP 132/60 | HR 62 | Temp 97.8°F | Resp 20 | Ht 73.0 in | Wt 232.0 lb

## 2018-02-09 DIAGNOSIS — J449 Chronic obstructive pulmonary disease, unspecified: Secondary | ICD-10-CM | POA: Diagnosis not present

## 2018-02-09 DIAGNOSIS — J9602 Acute respiratory failure with hypercapnia: Secondary | ICD-10-CM

## 2018-02-09 DIAGNOSIS — I739 Peripheral vascular disease, unspecified: Secondary | ICD-10-CM

## 2018-02-09 DIAGNOSIS — J9601 Acute respiratory failure with hypoxia: Secondary | ICD-10-CM

## 2018-02-09 DIAGNOSIS — E559 Vitamin D deficiency, unspecified: Secondary | ICD-10-CM | POA: Diagnosis not present

## 2018-02-09 DIAGNOSIS — J9611 Chronic respiratory failure with hypoxia: Secondary | ICD-10-CM | POA: Diagnosis not present

## 2018-02-09 DIAGNOSIS — I482 Chronic atrial fibrillation, unspecified: Secondary | ICD-10-CM

## 2018-02-09 DIAGNOSIS — I10 Essential (primary) hypertension: Secondary | ICD-10-CM

## 2018-02-09 DIAGNOSIS — I2781 Cor pulmonale (chronic): Secondary | ICD-10-CM | POA: Diagnosis not present

## 2018-02-09 DIAGNOSIS — E785 Hyperlipidemia, unspecified: Secondary | ICD-10-CM | POA: Diagnosis not present

## 2018-02-09 DIAGNOSIS — R739 Hyperglycemia, unspecified: Secondary | ICD-10-CM | POA: Diagnosis not present

## 2018-02-09 NOTE — Progress Notes (Signed)
Patient ID: Antonio Wise MRN: [id number], DOB: November 02, 1940, 77 y.o. Date of Encounter: @DATE @  Chief Complaint:  Chief Complaint  Patient presents with  . 54month follow up with blood pressure  . follow up with cholesterol    HPI: 77 y.o. year old male presents for routine follow-up office visit regarding hypertension, hyperlipidemia, COPD, chronic A. fib etc.  Today I have reviewed his chart.  Reviewed Dr. Gustavus Wise last pulmonary office visit note from 01/25/2018. He has diagnosis of  COPD --- GOLD IV.  Dr. Gustavus Wise note states for him to use oxygen at 2 L/min at rest/sleep and at 3 L with activity.  Is on Trelegy.  Has albuterol to use as needed and Xopenex nebulizer as directed.  Is also on spironolactone given cor pulmonale.  Today I also reviewed his last office note by Dr. Gwenlyn Wise 01/12/2018.  He manages his peripheral vascular occlusive disease.  He also has a history of atrial fibrillation.  He is on Xarelto for anticoagulation.  He is on Cardizem for rate control.  Today Antonio Wise reports that he has been feeling like he is stable from a symptomatic standpoint. He has no specific concerns to address today. I asked what his activity level is like on a usual day.  Also asked about his daughter.  States that his daughter lives across the street but comes to see him every day.  He reports that he mostly stays in the house.  Mostly watches TV.  Asked if he goes out to do grocery shopping etc. or if his daughter does it for him.  He says that it depends on how he is feeling.  Sometimes she just goes to the store for him.  Other times he will go along with her.  He is taking the atorvastatin 80 mg daily.  This is causing no myalgias or other adverse effects.  Taking the benazepril and other medications as directed.  Having no lightheadedness.  He is taking the Lasix and the spironolactone as directed.  His lower extremity edema is controlled with this.  Breathing is stable.   Past Medical  History:  Diagnosis Date  . Colon polyp 05/06/2008   repeat 5 years  . COPD (chronic obstructive pulmonary disease) (Antonio Wise)   . Duodenal ulcer 05/06/2008   EGD  . Elevated PSA   . Gastric ulcer 05/06/2008   EGD  . H/O Helicobacter infection 05/06/2008   EGD  . H/O: GI bleed   . History of nuclear stress test 02/19/2010   bruce protocol; mild-mod perfusion defect in spetal region consistent with attenuation artifact; no significant ischemia demonstrated; dysrhythmia during exercise  . Hyperlipidemia   . Hypertension   . Peripheral vascular disease (Antonio Wise)    a. s/p left common iliac artery stenting in 2006  . Persistent atrial fibrillation (Antonio Wise)    a. on Xarelto  . Smoker unmotivated to quit   . Vitamin D deficiency      Home Meds: Outpatient Medications Prior to Visit  Medication Sig Dispense Refill  . acetaminophen (TYLENOL) 500 MG tablet Take 500 mg by mouth every 6 (six) hours as needed for moderate pain or headache.     . albuterol (PROAIR HFA) 108 (90 Base) MCG/ACT inhaler 2 puffs every 4 hours as needed only  if your can't catch your breath 1 Inhaler 11  . atorvastatin (LIPITOR) 40 MG tablet TAKE 1 TABLET(40 MG) BY MOUTH DAILY AT 6 PM 90 tablet 3  . Cholecalciferol 4000 units CAPS  Take 1 capsule (4,000 Units total) by mouth daily. 30 capsule 3  . diltiazem (CARDIZEM CD) 240 MG 24 hr capsule TAKE 2 CAPSULES(480 MG) BY MOUTH DAILY 60 capsule 0  . famotidine (PEPCID) 20 MG tablet Take 1 tablet (20 mg total) by mouth 2 (two) times daily as needed for heartburn. 30 tablet 0  . Fluticasone-Umeclidin-Vilant (TRELEGY ELLIPTA) 100-62.5-25 MCG/INH AEPB Inhale 1 puff into the lungs daily. 2 each 0  . furosemide (LASIX) 20 MG tablet TAKE 2 TABLETS BY MOUTH DAILY 90 tablet 0  . levalbuterol (XOPENEX) 0.63 MG/3ML nebulizer solution Take 3 mLs (0.63 mg total) by nebulization every 6 (six) hours as needed for wheezing or shortness of breath. 3 mL 12  . metoprolol tartrate (LOPRESSOR) 50 MG tablet  Take 1 tablet (50 mg total) by mouth 2 (two) times daily. 180 tablet 3  . OXYGEN 2lpm 24/7  AHC    . spironolactone (ALDACTONE) 25 MG tablet TAKE 1 TABLET(25 MG) BY MOUTH TWICE DAILY 60 tablet 2  . XARELTO 20 MG TABS tablet TAKE 1 TABLET(20 MG) BY MOUTH DAILY WITH SUPPER 90 tablet 0   No facility-administered medications prior to visit.     Allergies:  Allergies  Allergen Reactions  . Codeine Nausea And Vomiting and Other (See Comments)    Severe nausea    Social History   Socioeconomic History  . Marital status: Married    Spouse name: Not on file  . Number of children: 3  . Years of education: Not on file  . Highest education level: Not on file  Occupational History    Employer: [employer]  . Financial resource strain: Not on file  . Food insecurity:    Worry: Not on file    Inability: Not on file  . Transportation needs:    Medical: Not on file    Non-medical: Not on file  Tobacco Use  . Smoking status: Former Smoker    Packs/day: 1.00    Years: 30.00    Pack years: 30.00    Types: Cigarettes    Last attempt to quit: 07/15/2017    Years since quitting: 0.5  . Smokeless tobacco: Never Used  Substance and Sexual Activity  . Alcohol use: No  . Drug use: No  . Sexual activity: Not on file  Lifestyle  . Physical activity:    Days per week: Not on file    Minutes per session: Not on file  . Stress: Not on file  Relationships  . Social connections:    Talks on phone: Not on file    Gets together: Not on file    Attends religious service: Not on file    Active member of club or organization: Not on file    Attends meetings of clubs or organizations: Not on file    Relationship status: Not on file  . Intimate partner violence:    Fear of current or ex partner: Not on file    Emotionally abused: Not on file    Physically abused: Not on file    Forced sexual activity: Not on file  Other Topics Concern  . Not on file  Social History Narrative     Entered 06/2014:   His wife was also a patient of mine (Antonio Wise)   She passed away in 2015--secondary to ongoing smoker, lung cancer.    At [place] 06/2104 pt reports that one of their kids was already living with them--so he is not alone, even  now .    Family History  Problem Relation Age of Onset  . Heart disease Mother   . COPD Father        smoked and worked in a [employer]  . Cancer Father   . Cancer Sister 59       Breast Cancer  . Diabetes Brother      Review of Systems:  See HPI for pertinent ROS. All other ROS negative.    Physical Exam: Blood pressure 132/60, pulse 62, temperature 97.8 F (36.6 C), temperature source Oral, resp. rate 20, height 6\' 1"  (1.854 m), weight 105.2 kg (232 lb), SpO2 96 %., Body mass index is 30.61 kg/m. General: WM. Appears in no acute distress. Neck: Supple. No thyromegaly. No lymphadenopathy. No carotid bruits. Lungs:  Breath sounds are distant and decreased throughout bilaterally.  No active wheezes or rhonchi or rales. Heart: Irregular rhythm Abdomen: Soft, non-tender, non-distended with normoactive bowel sounds. No hepatomegaly. No rebound/guarding. No obvious abdominal masses. Musculoskeletal:  Strength and tone normal for age. Extremities/Skin: Warm and dry.  No LE edema.  Neuro: Alert and oriented X 3. Moves all extremities spontaneously. Gait is normal. CNII-XII grossly in tact. Psych:  Responds to questions appropriately with a normal affect.     ASSESSMENT AND PLAN:  77 y.o. year old male with   1. Essential hypertension Blood Pressure is at goal/controlled.  Continue current medication.  Check lab to monitor. - COMPLETE METABOLIC PANEL WITH GFR  2. Peripheral vascular disease ([place]) Peripheral vascular disease is managed by Dr. Gwenlyn Wise  3. Chronic atrial fibrillation (HCC) His atrial fibrillation is managed by Dr. Gwenlyn Wise Is on Xarelto for anticoagulation.  On Cardizem for rate control.  4. Chronic cor pulmonale (HCC) He is  on spironolactone and Lasix in addition to other medications for his cor pulmonale.  Check lab to monitor. - COMPLETE METABOLIC PANEL WITH GFR  5. Acute respiratory failure with hypoxia and hypercapnia (HCC) This is managed by Dr. Melvyn Novas.  He is on nasal cannula oxygen, trilogy, albuterol, Xopenex nebulizer if needed.  6. COPD  GOLD IV  This is managed by Dr. Melvyn Novas.  He is on nasal cannula oxygen, trilogy, albuterol, Xopenex nebulizer if needed.   7. Chronic respiratory failure with hypoxia [facility]) This is managed by Dr. Melvyn Novas.  He is on nasal cannula oxygen, trilogy, albuterol, Xopenex nebulizer if needed.   8. Hyperlipidemia LDL goal <70 He is on Lipitor 80 mg.  He is fasting.  Check labs to monitor. - COMPLETE METABOLIC PANEL WITH GFR - Lipid panel  9. Vitamin D deficiency He is on vitamin D supplement secondary to history of vitamin D deficiency.  Vitamin D level has not been rechecked recently so we will recheck this today to monitor. - VITAMIN D 25 Hydroxy (Vit-D Deficiency, Fractures)    Will plan for routine follow-up visit and labs in 6 months.  Follow-up sooner if needed.    Signed, [address] [place], [place], B[facility] 02/09/2018 9:22 AM

## 2018-02-11 LAB — EXTRA LAV TOP TUBE

## 2018-02-11 LAB — LIPID PANEL
CHOL/HDL RATIO: 3.4 (calc) (ref <5.0)
CHOLESTEROL: 144 mg/dL (ref <200)
HDL: 42 mg/dL (ref >40)
LDL Cholesterol (Calc): 77 mg/dL (calc)
Non-HDL Cholesterol (Calc): 102 mg/dL (calc) (ref <130)
Triglycerides: 151 mg/dL — ABNORMAL HIGH (ref <150)

## 2018-02-11 LAB — COMPLETE METABOLIC PANEL WITH GFR
AG RATIO: 2.1 (calc) (ref 1.0–2.5)
ALBUMIN MSPROF: 4.6 g/dL (ref 3.6–5.1)
ALKALINE PHOSPHATASE (APISO): 67 U/L (ref 40–115)
ALT: 12 U/L (ref 9–46)
AST: 14 U/L (ref 10–35)
BILIRUBIN TOTAL: 0.6 mg/dL (ref 0.2–1.2)
BUN / CREAT RATIO: 14 (calc) (ref 6–22)
BUN: 25 mg/dL (ref 7–25)
CO2: 25 mmol/L (ref 20–32)
CREATININE: 1.76 mg/dL — AB (ref 0.70–1.18)
Calcium: 9.6 mg/dL (ref 8.6–10.3)
Chloride: 102 mmol/L (ref 98–110)
GFR, Est African American: 43 mL/min/{1.73_m2} — ABNORMAL LOW (ref >=60)
GFR, Est Non African American: 37 mL/min/{1.73_m2} — ABNORMAL LOW (ref >=60)
GLOBULIN: 2.2 g/dL (ref 1.9–3.7)
Glucose, Bld: 155 mg/dL — ABNORMAL HIGH (ref 65–99)
Potassium: 5 mmol/L (ref 3.5–5.3)
SODIUM: 141 mmol/L (ref 135–146)
Total Protein: 6.8 g/dL (ref 6.1–8.1)

## 2018-02-11 LAB — VITAMIN D 25 HYDROXY (VIT D DEFICIENCY, FRACTURES): Vit D, 25-Hydroxy: 24 ng/mL — ABNORMAL LOW (ref 30–100)

## 2018-02-11 LAB — TEST AUTHORIZATION

## 2018-02-11 LAB — HEMOGLOBIN A1C W/OUT EAG: HEMOGLOBIN A1C: 6.1 %{Hb} — AB (ref <5.7)

## 2018-02-23 DIAGNOSIS — J9600 Acute respiratory failure, unspecified whether with hypoxia or hypercapnia: Secondary | ICD-10-CM | POA: Diagnosis not present

## 2018-02-23 DIAGNOSIS — J441 Chronic obstructive pulmonary disease with (acute) exacerbation: Secondary | ICD-10-CM | POA: Diagnosis not present

## 2018-02-28 ENCOUNTER — Other Ambulatory Visit: Payer: Self-pay | Admitting: Cardiovascular Disease

## 2018-02-28 ENCOUNTER — Other Ambulatory Visit: Payer: Self-pay | Admitting: Physician Assistant

## 2018-02-28 DIAGNOSIS — R0602 Shortness of breath: Secondary | ICD-10-CM

## 2018-02-28 DIAGNOSIS — I10 Essential (primary) hypertension: Secondary | ICD-10-CM

## 2018-03-02 ENCOUNTER — Other Ambulatory Visit: Payer: Self-pay | Admitting: Cardiovascular Disease

## 2018-03-15 ENCOUNTER — Other Ambulatory Visit: Payer: Self-pay | Admitting: Cardiovascular Disease

## 2018-03-26 DIAGNOSIS — J9600 Acute respiratory failure, unspecified whether with hypoxia or hypercapnia: Secondary | ICD-10-CM | POA: Diagnosis not present

## 2018-03-26 DIAGNOSIS — J441 Chronic obstructive pulmonary disease with (acute) exacerbation: Secondary | ICD-10-CM | POA: Diagnosis not present

## 2018-03-27 ENCOUNTER — Other Ambulatory Visit: Payer: Self-pay | Admitting: Student

## 2018-03-28 ENCOUNTER — Telehealth: Payer: Self-pay | Admitting: Internal Medicine

## 2018-03-28 NOTE — Telephone Encounter (Signed)
Called patient unable to reach left message to give us a call back.

## 2018-03-29 ENCOUNTER — Other Ambulatory Visit: Payer: Self-pay | Admitting: Internal Medicine

## 2018-03-29 MED ORDER — LEVALBUTEROL HCL 0.63 MG/3ML IN NEBU: 0.6300 mg | INHALATION_SOLUTION | Freq: Four times a day (QID) | RESPIRATORY_TRACT | 12 refills | Status: DC | PRN

## 2018-03-29 NOTE — Telephone Encounter (Signed)
Called and spoke with patient he is aware of this and refills have been sent to [facility] on [address].Nothing further needed at this time.

## 2018-04-04 ENCOUNTER — Other Ambulatory Visit: Payer: Self-pay | Admitting: Physician Assistant

## 2018-04-25 DIAGNOSIS — J9600 Acute respiratory failure, unspecified whether with hypoxia or hypercapnia: Secondary | ICD-10-CM | POA: Diagnosis not present

## 2018-04-25 DIAGNOSIS — J441 Chronic obstructive pulmonary disease with (acute) exacerbation: Secondary | ICD-10-CM | POA: Diagnosis not present

## 2018-04-26 ENCOUNTER — Other Ambulatory Visit: Payer: Self-pay | Admitting: Cardiovascular Disease

## 2018-04-26 NOTE — Telephone Encounter (Signed)
Rx request sent to pharmacy.  

## 2018-05-09 ENCOUNTER — Other Ambulatory Visit: Payer: Self-pay

## 2018-05-26 ENCOUNTER — Other Ambulatory Visit: Payer: Self-pay | Admitting: Cardiovascular Disease

## 2018-05-26 DIAGNOSIS — R0602 Shortness of breath: Secondary | ICD-10-CM

## 2018-05-26 DIAGNOSIS — J9600 Acute respiratory failure, unspecified whether with hypoxia or hypercapnia: Secondary | ICD-10-CM | POA: Diagnosis not present

## 2018-05-26 DIAGNOSIS — I10 Essential (primary) hypertension: Secondary | ICD-10-CM

## 2018-05-26 DIAGNOSIS — J441 Chronic obstructive pulmonary disease with (acute) exacerbation: Secondary | ICD-10-CM | POA: Diagnosis not present

## 2018-06-09 ENCOUNTER — Inpatient Hospital Stay (HOSPITAL_COMMUNITY)
Admission: EM | Admit: 2018-06-09 | Discharge: 2018-06-16 | DRG: 871 | Disposition: A | Discharge status: Home Health | Payer: PPO | Attending: Internal Medicine | Admitting: Internal Medicine

## 2018-06-09 ENCOUNTER — Encounter (HOSPITAL_COMMUNITY): Payer: Self-pay | Admitting: Emergency Medicine

## 2018-06-09 ENCOUNTER — Emergency Department (HOSPITAL_COMMUNITY): Payer: PPO

## 2018-06-09 ENCOUNTER — Other Ambulatory Visit: Payer: Self-pay

## 2018-06-09 DIAGNOSIS — J90 Pleural effusion, not elsewhere classified: Secondary | ICD-10-CM | POA: Diagnosis not present

## 2018-06-09 DIAGNOSIS — K922 Gastrointestinal hemorrhage, unspecified: Secondary | ICD-10-CM

## 2018-06-09 DIAGNOSIS — I739 Peripheral vascular disease, unspecified: Secondary | ICD-10-CM | POA: Diagnosis not present

## 2018-06-09 DIAGNOSIS — I4891 Unspecified atrial fibrillation: Secondary | ICD-10-CM | POA: Diagnosis not present

## 2018-06-09 DIAGNOSIS — R443 Hallucinations, unspecified: Secondary | ICD-10-CM | POA: Diagnosis not present

## 2018-06-09 DIAGNOSIS — J9621 Acute and chronic respiratory failure with hypoxia: Secondary | ICD-10-CM | POA: Diagnosis not present

## 2018-06-09 DIAGNOSIS — K449 Diaphragmatic hernia without obstruction or gangrene: Secondary | ICD-10-CM | POA: Diagnosis present

## 2018-06-09 DIAGNOSIS — D122 Benign neoplasm of ascending colon: Secondary | ICD-10-CM | POA: Diagnosis not present

## 2018-06-09 DIAGNOSIS — R000 Tachycardia, unspecified: Secondary | ICD-10-CM | POA: Diagnosis not present

## 2018-06-09 DIAGNOSIS — R0689 Other abnormalities of breathing: Secondary | ICD-10-CM | POA: Diagnosis not present

## 2018-06-09 DIAGNOSIS — Z23 Encounter for immunization: Secondary | ICD-10-CM

## 2018-06-09 DIAGNOSIS — K5900 Constipation, unspecified: Secondary | ICD-10-CM

## 2018-06-09 DIAGNOSIS — K6289 Other specified diseases of anus and rectum: Secondary | ICD-10-CM | POA: Diagnosis not present

## 2018-06-09 DIAGNOSIS — J439 Emphysema, unspecified: Secondary | ICD-10-CM | POA: Diagnosis not present

## 2018-06-09 DIAGNOSIS — R7401 Elevation of levels of liver transaminase levels: Secondary | ICD-10-CM

## 2018-06-09 DIAGNOSIS — R740 Nonspecific elevation of levels of transaminase and lactic acid dehydrogenase [LDH]: Secondary | ICD-10-CM | POA: Diagnosis present

## 2018-06-09 DIAGNOSIS — J441 Chronic obstructive pulmonary disease with (acute) exacerbation: Secondary | ICD-10-CM

## 2018-06-09 DIAGNOSIS — R0602 Shortness of breath: Secondary | ICD-10-CM | POA: Diagnosis not present

## 2018-06-09 DIAGNOSIS — C20 Malignant neoplasm of rectum: Secondary | ICD-10-CM | POA: Diagnosis not present

## 2018-06-09 DIAGNOSIS — D72829 Elevated white blood cell count, unspecified: Secondary | ICD-10-CM

## 2018-06-09 DIAGNOSIS — D500 Iron deficiency anemia secondary to blood loss (chronic): Secondary | ICD-10-CM | POA: Diagnosis not present

## 2018-06-09 DIAGNOSIS — A419 Sepsis, unspecified organism: Principal | ICD-10-CM | POA: Diagnosis present

## 2018-06-09 DIAGNOSIS — J449 Chronic obstructive pulmonary disease, unspecified: Secondary | ICD-10-CM | POA: Diagnosis not present

## 2018-06-09 DIAGNOSIS — J9811 Atelectasis: Secondary | ICD-10-CM | POA: Diagnosis present

## 2018-06-09 DIAGNOSIS — E785 Hyperlipidemia, unspecified: Secondary | ICD-10-CM | POA: Diagnosis not present

## 2018-06-09 DIAGNOSIS — R195 Other fecal abnormalities: Secondary | ICD-10-CM | POA: Diagnosis not present

## 2018-06-09 DIAGNOSIS — E860 Dehydration: Secondary | ICD-10-CM | POA: Diagnosis present

## 2018-06-09 DIAGNOSIS — Z79899 Other long term (current) drug therapy: Secondary | ICD-10-CM | POA: Diagnosis not present

## 2018-06-09 DIAGNOSIS — R55 Syncope and collapse: Secondary | ICD-10-CM | POA: Diagnosis not present

## 2018-06-09 DIAGNOSIS — Z8249 Family history of ischemic heart disease and other diseases of the circulatory system: Secondary | ICD-10-CM

## 2018-06-09 DIAGNOSIS — Z8719 Personal history of other diseases of the digestive system: Secondary | ICD-10-CM

## 2018-06-09 DIAGNOSIS — N179 Acute kidney failure, unspecified: Secondary | ICD-10-CM | POA: Diagnosis not present

## 2018-06-09 DIAGNOSIS — Z885 Allergy status to narcotic agent status: Secondary | ICD-10-CM

## 2018-06-09 DIAGNOSIS — D630 Anemia in neoplastic disease: Secondary | ICD-10-CM | POA: Diagnosis not present

## 2018-06-09 DIAGNOSIS — Z7901 Long term (current) use of anticoagulants: Secondary | ICD-10-CM | POA: Diagnosis not present

## 2018-06-09 DIAGNOSIS — C189 Malignant neoplasm of colon, unspecified: Secondary | ICD-10-CM

## 2018-06-09 DIAGNOSIS — D649 Anemia, unspecified: Secondary | ICD-10-CM

## 2018-06-09 DIAGNOSIS — D123 Benign neoplasm of transverse colon: Secondary | ICD-10-CM | POA: Diagnosis not present

## 2018-06-09 DIAGNOSIS — Z8711 Personal history of peptic ulcer disease: Secondary | ICD-10-CM | POA: Diagnosis not present

## 2018-06-09 DIAGNOSIS — I10 Essential (primary) hypertension: Secondary | ICD-10-CM | POA: Diagnosis not present

## 2018-06-09 DIAGNOSIS — I2781 Cor pulmonale (chronic): Secondary | ICD-10-CM | POA: Diagnosis present

## 2018-06-09 DIAGNOSIS — D124 Benign neoplasm of descending colon: Secondary | ICD-10-CM | POA: Diagnosis present

## 2018-06-09 DIAGNOSIS — J80 Acute respiratory distress syndrome: Secondary | ICD-10-CM | POA: Diagnosis not present

## 2018-06-09 DIAGNOSIS — D120 Benign neoplasm of cecum: Secondary | ICD-10-CM | POA: Diagnosis not present

## 2018-06-09 DIAGNOSIS — R0902 Hypoxemia: Secondary | ICD-10-CM | POA: Diagnosis not present

## 2018-06-09 DIAGNOSIS — K219 Gastro-esophageal reflux disease without esophagitis: Secondary | ICD-10-CM | POA: Diagnosis not present

## 2018-06-09 DIAGNOSIS — F17211 Nicotine dependence, cigarettes, in remission: Secondary | ICD-10-CM | POA: Diagnosis not present

## 2018-06-09 DIAGNOSIS — D490 Neoplasm of unspecified behavior of digestive system: Secondary | ICD-10-CM | POA: Diagnosis not present

## 2018-06-09 DIAGNOSIS — Z87891 Personal history of nicotine dependence: Secondary | ICD-10-CM

## 2018-06-09 DIAGNOSIS — E872 Acidosis: Secondary | ICD-10-CM | POA: Diagnosis not present

## 2018-06-09 DIAGNOSIS — I4819 Other persistent atrial fibrillation: Secondary | ICD-10-CM | POA: Diagnosis not present

## 2018-06-09 DIAGNOSIS — K921 Melena: Secondary | ICD-10-CM | POA: Diagnosis present

## 2018-06-09 DIAGNOSIS — D729 Disorder of white blood cells, unspecified: Secondary | ICD-10-CM | POA: Diagnosis not present

## 2018-06-09 DIAGNOSIS — K8020 Calculus of gallbladder without cholecystitis without obstruction: Secondary | ICD-10-CM | POA: Diagnosis not present

## 2018-06-09 DIAGNOSIS — Z9981 Dependence on supplemental oxygen: Secondary | ICD-10-CM

## 2018-06-09 DIAGNOSIS — Z79890 Hormone replacement therapy (postmenopausal): Secondary | ICD-10-CM

## 2018-06-09 LAB — PREPARE RBC (CROSSMATCH)

## 2018-06-09 LAB — CBC WITH DIFFERENTIAL/PLATELET
Abs Immature Granulocytes: 0 10*3/uL (ref 0.00–0.07)
Abs Immature Granulocytes: 0.16 10*3/uL — ABNORMAL HIGH (ref 0.00–0.07)
BASOS ABS: 0.3 10*3/uL — AB (ref 0.0–0.1)
Basophils Absolute: 0 10*3/uL (ref 0.0–0.1)
Basophils Relative: 1 %
Basophils Relative: 0 %
EOS ABS: 0 10*3/uL (ref 0.0–0.5)
Eosinophils Absolute: 0 10*3/uL (ref 0.0–0.5)
Eosinophils Relative: 0 %
Eosinophils Relative: 0 %
HCT: 25.1 % — ABNORMAL LOW (ref 39.0–52.0)
HCT: 20.4 % — ABNORMAL LOW (ref 39.0–52.0)
Hemoglobin: 5.8 g/dL — CL (ref 13.0–17.0)
Hemoglobin: 4.7 g/dL — CL (ref 13.0–17.0)
Immature Granulocytes: 1 %
LYMPHS PCT: 2 %
Lymphocytes Relative: 3 %
Lymphs Abs: 0.6 10*3/uL — ABNORMAL LOW (ref 0.7–4.0)
Lymphs Abs: 0.6 10*3/uL — ABNORMAL LOW (ref 0.7–4.0)
MCH: 18 pg — ABNORMAL LOW (ref 26.0–34.0)
MCH: 17.6 pg — AB (ref 26.0–34.0)
MCHC: 23.1 g/dL — ABNORMAL LOW (ref 30.0–36.0)
MCHC: 23 g/dL — ABNORMAL LOW (ref 30.0–36.0)
MCV: 78 fL — ABNORMAL LOW (ref 80.0–100.0)
MCV: 76.4 fL — ABNORMAL LOW (ref 80.0–100.0)
Monocytes Absolute: 3.5 10*3/uL — ABNORMAL HIGH (ref 0.1–1.0)
Monocytes Absolute: 1 10*3/uL (ref 0.1–1.0)
Monocytes Relative: 12 %
Monocytes Relative: 5 %
Neutro Abs: 25 10*3/uL — ABNORMAL HIGH (ref 1.7–7.7)
Neutro Abs: 17.3 10*3/uL — ABNORMAL HIGH (ref 1.7–7.7)
Neutrophils Relative %: 85 %
Neutrophils Relative %: 91 %
PLATELETS: 793 10*3/uL — AB (ref 150–400)
Platelets: 613 10*3/uL — ABNORMAL HIGH (ref 150–400)
RBC: 3.22 MIL/uL — AB (ref 4.22–5.81)
RBC: 2.67 MIL/uL — ABNORMAL LOW (ref 4.22–5.81)
RDW: 19.2 % — ABNORMAL HIGH (ref 11.5–15.5)
RDW: 18.7 % — ABNORMAL HIGH (ref 11.5–15.5)
WBC: 29.4 10*3/uL — ABNORMAL HIGH (ref 4.0–10.5)
WBC: 19 10*3/uL — ABNORMAL HIGH (ref 4.0–10.5)
nRBC: 2.5 % — ABNORMAL HIGH (ref 0.0–0.2)
nRBC: 3 /100 WBC — ABNORMAL HIGH
nRBC: 1.1 % — ABNORMAL HIGH (ref 0.0–0.2)

## 2018-06-09 LAB — I-STAT ARTERIAL BLOOD GAS, ED
ACID-BASE DEFICIT: 1 mmol/L (ref 0.0–2.0)
Acid-Base Excess: 2 mmol/L (ref 0.0–2.0)
BICARBONATE: 25.6 mmol/L (ref 20.0–28.0)
Bicarbonate: 28.3 mmol/L — ABNORMAL HIGH (ref 20.0–28.0)
O2 Saturation: 99 %
O2 Saturation: 99 %
Patient temperature: 98.6
TCO2: 27 mmol/L (ref 22–32)
TCO2: 30 mmol/L (ref 22–32)
pCO2 arterial: 54.3 mmHg — ABNORMAL HIGH (ref 32.0–48.0)
pCO2 arterial: 54.3 mmHg — ABNORMAL HIGH (ref 32.0–48.0)
pH, Arterial: 7.282 — ABNORMAL LOW (ref 7.350–7.450)
pH, Arterial: 7.326 — ABNORMAL LOW (ref 7.350–7.450)
pO2, Arterial: 141 mmHg — ABNORMAL HIGH (ref 83.0–108.0)
pO2, Arterial: 140 mmHg — ABNORMAL HIGH (ref 83.0–108.0)

## 2018-06-09 LAB — COMPREHENSIVE METABOLIC PANEL
ALT: 375 U/L — ABNORMAL HIGH (ref 0–44)
ALT: 319 U/L — ABNORMAL HIGH (ref 0–44)
ANION GAP: 21 — AB (ref 5–15)
AST: 327 U/L — ABNORMAL HIGH (ref 15–41)
AST: 317 U/L — ABNORMAL HIGH (ref 15–41)
Albumin: 3.8 g/dL (ref 3.5–5.0)
Albumin: 3.3 g/dL — ABNORMAL LOW (ref 3.5–5.0)
Alkaline Phosphatase: 100 U/L (ref 38–126)
Alkaline Phosphatase: 84 U/L (ref 38–126)
Anion gap: 20 — ABNORMAL HIGH (ref 5–15)
BILIRUBIN TOTAL: 1.9 mg/dL — AB (ref 0.3–1.2)
BUN: 29 mg/dL — ABNORMAL HIGH (ref 8–23)
BUN: 33 mg/dL — ABNORMAL HIGH (ref 8–23)
CO2: 22 mmol/L (ref 22–32)
CO2: 20 mmol/L — ABNORMAL LOW (ref 22–32)
CREATININE: 2.23 mg/dL — AB (ref 0.61–1.24)
Calcium: 8.5 mg/dL — ABNORMAL LOW (ref 8.9–10.3)
Calcium: 8.1 mg/dL — ABNORMAL LOW (ref 8.9–10.3)
Chloride: 100 mmol/L (ref 98–111)
Chloride: 101 mmol/L (ref 98–111)
Creatinine, Ser: 2.28 mg/dL — ABNORMAL HIGH (ref 0.61–1.24)
GFR calc Af Amer: 31 mL/min — ABNORMAL LOW (ref >60)
GFR calc Af Amer: 32 mL/min — ABNORMAL LOW (ref >60)
GFR calc non Af Amer: 27 mL/min — ABNORMAL LOW (ref >60)
GFR calc non Af Amer: 28 mL/min — ABNORMAL LOW (ref >60)
Glucose, Bld: 153 mg/dL — ABNORMAL HIGH (ref 70–99)
Glucose, Bld: 202 mg/dL — ABNORMAL HIGH (ref 70–99)
Potassium: 4.2 mmol/L (ref 3.5–5.1)
Potassium: 3.8 mmol/L (ref 3.5–5.1)
Sodium: 143 mmol/L (ref 135–145)
Sodium: 141 mmol/L (ref 135–145)
Total Bilirubin: 1.8 mg/dL — ABNORMAL HIGH (ref 0.3–1.2)
Total Protein: 6.6 g/dL (ref 6.5–8.1)
Total Protein: 5.9 g/dL — ABNORMAL LOW (ref 6.5–8.1)

## 2018-06-09 LAB — IRON AND TIBC
Iron: 8 ug/dL — ABNORMAL LOW (ref 45–182)
SATURATION RATIOS: 2 % — AB (ref 17.9–39.5)
TIBC: 468 ug/dL — ABNORMAL HIGH (ref 250–450)
UIBC: 460 ug/dL

## 2018-06-09 LAB — I-STAT CG4 LACTIC ACID, ED
Lactic Acid, Venous: 8.5 mmol/L (ref 0.5–1.9)
Lactic Acid, Venous: 6.78 mmol/L (ref 0.5–1.9)

## 2018-06-09 LAB — I-STAT TROPONIN, ED: TROPONIN I, POC: 0.01 ng/mL (ref 0.00–0.08)

## 2018-06-09 LAB — FOLATE: Folate: 12.3 ng/mL (ref >5.9)

## 2018-06-09 LAB — PROCALCITONIN: Procalcitonin: 1.48 ng/mL

## 2018-06-09 LAB — FERRITIN: Ferritin: 18 ng/mL — ABNORMAL LOW (ref 24–336)

## 2018-06-09 LAB — RETICULOCYTES
Immature Retic Fract: 27.4 % — ABNORMAL HIGH (ref 2.3–15.9)
RBC.: 2.88 MIL/uL — ABNORMAL LOW (ref 4.22–5.81)
Retic Count, Absolute: 59 10*3/uL (ref 19.0–186.0)
Retic Ct Pct: 2.1 % (ref 0.4–3.1)

## 2018-06-09 LAB — VITAMIN B12: Vitamin B-12: 572 pg/mL (ref 180–914)

## 2018-06-09 LAB — ABO/RH: ABO/RH(D): A POS

## 2018-06-09 LAB — BRAIN NATRIURETIC PEPTIDE: B Natriuretic Peptide: 543.8 pg/mL — ABNORMAL HIGH (ref 0.0–100.0)

## 2018-06-09 MED ORDER — IPRATROPIUM-ALBUTEROL 0.5-2.5 (3) MG/3ML IN SOLN
3.0000 mL | Freq: Once | RESPIRATORY_TRACT | Status: AC
  Administered 2018-06-09: 3 mL via RESPIRATORY_TRACT
  Filled 2018-06-09: qty 3

## 2018-06-09 MED ORDER — VANCOMYCIN HCL IN DEXTROSE 1-5 GM/200ML-% IV SOLN
1000.0000 mg | INTRAVENOUS | Status: AC
  Administered 2018-06-09: 1000 mg via INTRAVENOUS
  Filled 2018-06-09: qty 200

## 2018-06-09 MED ORDER — METHYLPREDNISOLONE SODIUM SUCC 125 MG IJ SOLR
60.0000 mg | Freq: Four times a day (QID) | INTRAMUSCULAR | Status: DC
  Administered 2018-06-09 – 2018-06-14 (×19): 60 mg via INTRAVENOUS
  Filled 2018-06-09 (×19): qty 2

## 2018-06-09 MED ORDER — VANCOMYCIN HCL 10 G IV SOLR: 1750.0000 mg | INTRAVENOUS | Status: DC

## 2018-06-09 MED ORDER — SODIUM CHLORIDE 0.9 % IV SOLN
10.0000 mL/h | Freq: Once | INTRAVENOUS | Status: AC
  Administered 2018-06-09: 10 mL/h via INTRAVENOUS

## 2018-06-09 MED ORDER — PIPERACILLIN-TAZOBACTAM 3.375 G IVPB
3.3750 g | Freq: Once | INTRAVENOUS | Status: AC
  Administered 2018-06-09: 3.375 g via INTRAVENOUS
  Filled 2018-06-09: qty 50

## 2018-06-09 MED ORDER — VANCOMYCIN HCL IN DEXTROSE 1-5 GM/200ML-% IV SOLN
1000.0000 mg | Freq: Once | INTRAVENOUS | Status: AC
  Administered 2018-06-09: 1000 mg via INTRAVENOUS
  Filled 2018-06-09: qty 200

## 2018-06-09 MED ORDER — METHYLPREDNISOLONE SODIUM SUCC 125 MG IJ SOLR
125.0000 mg | Freq: Once | INTRAMUSCULAR | Status: AC
  Administered 2018-06-09: 125 mg via INTRAVENOUS
  Filled 2018-06-09: qty 2

## 2018-06-09 MED ORDER — DIPHENHYDRAMINE HCL 25 MG PO CAPS
25.0000 mg | ORAL_CAPSULE | Freq: Four times a day (QID) | ORAL | Status: DC | PRN
  Administered 2018-06-09: 25 mg via ORAL
  Filled 2018-06-09: qty 1

## 2018-06-09 MED ORDER — SODIUM CHLORIDE 0.9 % IV SOLN
2.0000 g | INTRAVENOUS | Status: DC
  Administered 2018-06-09 – 2018-06-13 (×5): 2 g via INTRAVENOUS
  Filled 2018-06-09 (×6): qty 2

## 2018-06-09 MED ORDER — SODIUM CHLORIDE 0.9 % IV BOLUS
1000.0000 mL | Freq: Once | INTRAVENOUS | Status: AC
  Administered 2018-06-09: 1000 mL via INTRAVENOUS

## 2018-06-09 MED ORDER — PANTOPRAZOLE SODIUM 40 MG PO TBEC
40.0000 mg | DELAYED_RELEASE_TABLET | Freq: Two times a day (BID) | ORAL | Status: DC
  Administered 2018-06-09 – 2018-06-16 (×12): 40 mg via ORAL
  Filled 2018-06-09 (×12): qty 1

## 2018-06-09 MED ORDER — SODIUM CHLORIDE 0.9 % IV SOLN
INTRAVENOUS | Status: DC
  Administered 2018-06-09 – 2018-06-13 (×6): via INTRAVENOUS

## 2018-06-09 MED ORDER — ACETAMINOPHEN 325 MG PO TABS
650.0000 mg | ORAL_TABLET | Freq: Four times a day (QID) | ORAL | Status: DC | PRN
  Administered 2018-06-09 – 2018-06-12 (×2): 650 mg via ORAL
  Filled 2018-06-09 (×2): qty 2

## 2018-06-09 NOTE — H&P (Signed)
History and Physical    Antonio Wise LNL:[id number] DOB: [birth year] DOA: 06/09/2018  PCP: [name], PA-C Patient coming from:home lives alone daughter lives across.  Chief Complaint: sob  HPI: Antonio Wise is a 77 y.o. male with medical history significant of O2 dependent COPD, uses 2 L of oxygen 24 hours a day, chronic GI bleed, history of duodenal ulcer, gastric ulcer, diverticulitis, atrial fibrillation on Xarelto, hypertension, hyperlipidemia admitted with progressive worsening of shortness of breath for the last few days prior to admission to the hospital.  Patient lives alone.  He was found on the floor by the family after being on the floor for approximately 4 hours.  He complains of generalized weakness and difficulty breathing upon arrival to the ER.  He was placed on a CPAP and brought him to the hospital in the ER patient was placed on BiPAP.  He denies fever, chills, cough, nausea vomiting diarrhea abdominal pain chest pain or palpitation.  He denies urinary complaints.  He does report decreased appetite he thinks he must have lost weight but no know how much.  Reports decreased p.o. intake at home. Patient has been having chronic GI bleed with maroon stool, bright red bleeding per rectum on and off for almost a year now.  He was supposed to have a colonoscopy by Dr. [name] with [facility].  However it has not happened it was always postponed due to some reason or the other.    ED Course: His vital signs were 114/90, temperature 97.4 pulse rate 116 respiration 21 saturation 93% on 4 L.  He received Solu-Medrol and nebulizer treatments.  His initial lactic acid level was 8.50 which came down to 6.780 without any intervention.  Repeat lactic acid is pending at this time.  His white count was 29.4 hemoglobin 5.8 platelet count 793.  Sodium 143 potassium 4.2 BUN 29 creatinine 2.28 anion gap is 21.  pH was 7.28 PCO2 54 bicarb 20 5 repeat ABG after BiPAP 7.32 CO2 still 54 bicarbonate 28.   He was seen by GI Dr. [name] in the ER.  Showed hazy opacity at the right lung base may be due to atelectasis could also represent pneumonia or aspiration.  Findings consistent with emphysema noted.  Review of Systems: As per HPI otherwise all other systems reviewed and are negative  Ambulatory Status: Patient is ambulatory at baseline at home he walks inside the house.  Past Medical History:  Diagnosis Date  . Colon polyp 05/06/2008   repeat 5 years  . COPD (chronic obstructive pulmonary disease) ([place])   . Duodenal ulcer 05/06/2008   EGD  . Elevated PSA   . Gastric ulcer 05/06/2008   EGD  . H/O Helicobacter infection 05/06/2008   EGD  . H/O: GI bleed   . History of nuclear stress test 02/19/2010   bruce protocol; mild-mod perfusion defect in spetal region consistent with attenuation artifact; no significant ischemia demonstrated; dysrhythmia during exercise  . Hyperlipidemia   . Hypertension   . Peripheral vascular disease ([place])    a. s/p left common iliac artery stenting in 2006  . Persistent atrial fibrillation    a. on Xarelto  . Smoker unmotivated to quit   . Vitamin D deficiency     Past Surgical History:  Procedure Laterality Date  . ILIAC VEIN ANGIOPLASTY / STENTING  05/19/2005   left CIA PTA & stenting   . TRANSTHORACIC ECHOCARDIOGRAM  06/10/2005   LV hyperdynamic; borderline LA enlargement; trace MR/TR/AVR  Social History   Socioeconomic History  . Marital status: Married    Spouse name: Not on file  . Number of children: 3  . Years of education: Not on file  . Highest education level: Not on file  Occupational History    Employer: [employer]  . Financial resource strain: Not on file  . Food insecurity:    Worry: Not on file    Inability: Not on file  . Transportation needs:    Medical: Not on file    Non-medical: Not on file  Tobacco Use  . Smoking status: Former Smoker    Packs/day: 1.00    Years: 30.00    Pack years: 30.00     Types: Cigarettes    Last attempt to quit: 07/15/2017    Years since quitting: 0.9  . Smokeless tobacco: Never Used  Substance and Sexual Activity  . Alcohol use: No  . Drug use: No  . Sexual activity: Not on file  Lifestyle  . Physical activity:    Days per week: Not on file    Minutes per session: Not on file  . Stress: Not on file  Relationships  . Social connections:    Talks on phone: Not on file    Gets together: Not on file    Attends religious service: Not on file    Active member of club or organization: Not on file    Attends meetings of clubs or organizations: Not on file    Relationship status: Not on file  . Intimate partner violence:    Fear of current or ex partner: Not on file    Emotionally abused: Not on file    Physically abused: Not on file    Forced sexual activity: Not on file  Other Topics Concern  . Not on file  Social History Narrative   Entered 06/2014:   His wife was also a patient of mine ([facility])   She passed away in 2015--secondary to ongoing smoker, lung cancer.    At [place] 06/2104 pt reports that one of their kids was already living with them--so he is not alone, even now .    Allergies  Allergen Reactions  . Codeine Nausea And Vomiting and Other (See Comments)    Severe nausea    Family History  Problem Relation Age of Onset  . Heart disease Mother   . COPD Father        smoked and worked in a [employer]  . Cancer Father   . Cancer Sister 52       Breast Cancer  . Diabetes Brother       Prior to Admission medications   Medication Sig Start Date End Date Taking? Authorizing Provider  acetaminophen (TYLENOL) 500 MG tablet Take 500 mg by mouth every 6 (six) hours as needed for moderate pain or headache.    Yes [provider]  albuterol (PROAIR HFA) 108 (90 Base) MCG/ACT inhaler 2 puffs every 4 hours as needed only  if your can't catch your breath Patient taking differently: Inhale 2 puffs into the lungs See admin  instructions. Inhale 2 puffs into the lungs every 4 hours as needed- "only if your can't catch your breath" 10/26/17  Yes Tanda Rockers, MD  atorvastatin (LIPITOR) 40 MG tablet TAKE 1 TABLET(40 MG) BY MOUTH DAILY AT 6 PM Patient taking differently: Take 40 mg by mouth at bedtime.  03/02/18  Yes Lorretta Harp, MD  Cholecalciferol 4000  units CAPS Take 1 capsule (4,000 Units total) by mouth daily. 10/14/16  Yes Dena Billet B, PA-C  diltiazem (CARDIZEM CD) 240 MG 24 hr capsule TAKE 2 CAPSULES(480 MG) BY MOUTH DAILY Patient taking differently: Take 240 mg by mouth 2 (two) times daily.  04/05/18  Yes Dixon, Lonie Peak, PA-C  Fluticasone-Umeclidin-Vilant (TRELEGY ELLIPTA) 100-62.5-25 MCG/INH AEPB Inhale 1 puff into the lungs daily. 01/25/18  Yes Tanda Rockers, MD  furosemide (LASIX) 20 MG tablet TAKE 2 TABLETS BY MOUTH DAILY Patient taking differently: Take 40 mg by mouth daily.  04/26/18  Yes Lorretta Harp, MD  levalbuterol (XOPENEX) 0.63 MG/3ML nebulizer solution USE 1 VIAL VIA NEBULIZER EVERY 6 HOURS AS NEEDED FOR WHEEZING OR SHORTNESS OF BREATH Patient taking differently: Take 0.63 mg by nebulization every 6 (six) hours as needed for wheezing or shortness of breath.  03/29/18  Yes Tanda Rockers, MD  metoprolol tartrate (LOPRESSOR) 50 MG tablet TAKE 1 TABLET BY MOUTH TWICE DAILY Patient taking differently: Take 50 mg by mouth 2 (two) times daily.  03/28/18  Yes Lorretta Harp, MD  OXYGEN Inhale 2 L into the lungs continuous.    Yes [provider]  XARELTO 20 MG TABS tablet TAKE 1 TABLET(20 MG) BY MOUTH DAILY WITH SUPPER Patient taking differently: Take 20 mg by mouth daily with supper.  05/27/18  Yes Lorretta Harp, MD  famotidine (PEPCID) 20 MG tablet Take 1 tablet (20 mg total) by mouth 2 (two) times daily as needed for heartburn. Patient not taking: Reported on 06/09/2018 07/23/17   Lavina Hamman, MD  spironolactone (ALDACTONE) 25 MG tablet TAKE 1 TABLET(25 MG) BY MOUTH TWICE  DAILY Patient not taking: Reported on 06/09/2018 12/27/17   Tanda Rockers, MD    Physical Exam: Vitals:   06/09/18 1230 06/09/18 1245 06/09/18 1310 06/09/18 1500  BP: 130/76 117/69 124/63 112/86  Pulse:  (!) 42 73   Resp: (!) 21 18 17  (!) 21  Temp:      TempSrc:      SpO2:  90% (!) 79% 94%  Weight:      Height:         . General:  Appears mild respiratory distress was using accessory muscles and he first came into the ER currently he is off the BiPAP. Marland Kitchen Eyes:  PERRL, EOMI, normal lids, iris . ENT: grossly normal hearing, lips & tongue, mucous membranes dry . Neck:  no LAD, masses or thyromegaly . Cardiovascular:  RRR, no m/r/g. No LE edema.  Marland Kitchen Respiratory: Scattered rhonchi and wheezing bilaterally, no w/r/r. Normal respiratory effort. . Abdomen:  soft, ntnd, NABS . Skin:  no rash or induration seen on limited exam . Musculoskeletal:  grossly normal tone BUE/BLE, good ROM, no bony abnormality . Psychiatric: grossly normal mood and affect, speech fluent and appropriate, AOx3 Neurologic:  CN 2-12 grossly intact, moves all extremities in coordinated fashion, sensation intact Extremities shows trace edema bilaterally Labs on Admission: I have personally reviewed following labs and imaging studies  CBC: Recent Labs  Lab 06/09/18 1253  WBC 29.4*  NEUTROABS 25.0*  HGB 5.8*  HCT 25.1*  MCV 78.0*  PLT 161*   Basic Metabolic Panel: Recent Labs  Lab 06/09/18 1253  NA 143  K 4.2  CL 100  CO2 22  GLUCOSE 153*  BUN 29*  CREATININE 2.28*  CALCIUM 8.5*   GFR: Estimated Creatinine Clearance: 35.6 mL/min (A) (by C-G formula based on SCr of 2.28 mg/dL (H)). Liver  Function Tests: Recent Labs  Lab 06/09/18 1253  AST 327*  ALT 375*  ALKPHOS 100  BILITOT 1.8*  PROT 6.6  ALBUMIN 3.8   No results for input(s): LIPASE, AMYLASE in the last 168 hours. No results for input(s): AMMONIA in the last 168 hours. Coagulation Profile: No results for input(s): INR, PROTIME in the  last 168 hours. Cardiac Enzymes: No results for input(s): CKTOTAL, CKMB, CKMBINDEX, TROPONINI in the last 168 hours. BNP (last 3 results) Recent Labs    10/08/17 1148  PROBNP 335.0*   HbA1C: No results for input(s): HGBA1C in the last 72 hours. CBG: No results for input(s): GLUCAP in the last 168 hours. Lipid Profile: No results for input(s): CHOL, HDL, LDLCALC, TRIG, CHOLHDL, LDLDIRECT in the last 72 hours. Thyroid Function Tests: No results for input(s): TSH, T4TOTAL, FREET4, T3FREE, THYROIDAB in the last 72 hours. Anemia Panel: No results for input(s): VITAMINB12, FOLATE, FERRITIN, TIBC, IRON, RETICCTPCT in the last 72 hours. Urine analysis:    Component Value Date/Time   COLORURINE YELLOW 10/11/2015 1212   APPEARANCEUR CLEAR 10/11/2015 1212   LABSPEC 1.020 10/11/2015 1212   PHURINE 5.5 10/11/2015 1212   GLUCOSEU NEGATIVE 10/11/2015 1212   HGBUR NEGATIVE 10/11/2015 1212   BILIRUBINUR NEGATIVE 10/11/2015 1212   KETONESUR NEGATIVE 10/11/2015 1212   PROTEINUR TRACE (A) 10/11/2015 1212   NITRITE NEGATIVE 10/11/2015 1212   LEUKOCYTESUR NEGATIVE 10/11/2015 1212    Creatinine Clearance: Estimated Creatinine Clearance: 35.6 mL/min (A) (by C-G formula based on SCr of 2.28 mg/dL (H)).  Sepsis Labs: @LABRCNTIP (procalcitonin:4,lacticidven:4) )No results found for this or any previous visit (from the past 240 hour(s)).   Radiological Exams on Admission: Dg Chest Port 1 View  Result Date: 06/09/2018 CLINICAL DATA:  Patient found down today.  History of COPD. EXAM: PORTABLE CHEST 1 VIEW COMPARISON:  PA and lateral chest 10/08/2017. FINDINGS: The lungs are emphysematous. Lung volumes are somewhat lower than on the comparison exams. Hazy airspace opacity is seen in the right lung base. The left lung is clear. No pneumothorax or pleural effusion. Heart size is normal. Aortic atherosclerosis is noted. No acute bony abnormality. IMPRESSION: Hazy opacity in the right lung base may be due  to atelectasis in this low volume chest but could also be secondary to pneumonia or aspiration. Emphysema. Atherosclerosis. Electronically Signed   By: Inge Rise M.D.   On: 06/09/2018 12:47    EKG: Independently reviewed.   Assessment/Plan Active Problems:   GI bleed #1 COPD exacerbation patient admitted with complaints of progressive worsening shortness of breath for the last few days prior to admission to the hospital he was initially placed on BiPAP with improvement in his ABG and acidosis.  We will treat him with nebulizers and Solu-Medrol.  Patient is on oxygen at home at 2 L 24 hours a day.  Currently he is on 4 L oxygen saturation above 90%.  Chest x-ray shows findings consistent with COPD and a small focus unknown if it is atelectasis or infiltrates.  However will cover him with vancomycin and cefepime.  #2 sepsis/Sirs-patient is tachycardic and tachypneic with profound leukocytosis of 29,000 with high lactic acid over 8.7.  Will treat him with IV fluids IV vancomycin and cefepime.  Source is unknown at this time.  Possible source is pneumonia.  Blood culture pending.  Add procalcitonin.  #3 AKI patient with no history of CKD.  This is secondary to dehydration and decreased p.o. intake and he takes Lasix and Aldactone at home.  Will hold diuretics and slow hydration overnight and repeat labs in the morning.  Creatinine is 2.28  upon admission.  #4 anemia due to chronic blood loss GI source.  Will place him on Protonix hold Xarelto.  GI following.  Blood transfusion ordered.  Anemia panel pending.  Hemoglobin 5.8 on admission.  #5 transaminitis?  Follow-up labs tomorrow if worsening will need further work-up.  Follow-up after IV hydration.    Estimated body mass index is 30.54 kg/m as calculated from the following:   Height as of this encounter: 6\' 1"  (1.854 m).   Weight as of this encounter: 105 kg.   DVT prophylaxis: Lovenox Code Status: Full code Family Communication:  None Disposition Plan: Pending clinical improvement with COPD as well as GI bleeding and renal function Consults called: [facility] Admission status: Inpatient   Georgette Shell MD [facility]  If 7PM-7AM, please contact night-coverage [email or web address] Password [facility]  06/09/2018, 3:38 PM

## 2018-06-09 NOTE — Progress Notes (Signed)
Pharmacy Antibiotic Note  Antonio Wise is a 77 y.o. male admitted on 06/09/2018 after found down by family. Pharmacy has been consulted for vancomycin and cefepime dosing for sepsis.  Patient received vancomycin 1gm IV around 1530 and Zosyn 3.375gm IV around 1430.  SCr 2.28 (BL SCr 1.3-1.5), CrCL 29 ml/min, afebrile, WBC 29.4, LA 8.5 > 6.78.   Plan: Vanc 1gm IV now for a total of 2gm load, then 1750mg  IV Q48H for AUC 467 using AdjBW of 90kg  Cefepime 2gm IV Q24H, start tonight Monitor renal fxn, clinical progress, vanc levels as indicated   Height: 6\' 1"  (185.4 cm) Weight: 231 lb 7.7 oz (105 kg) IBW/kg (Calculated) : 79.9  Temp (24hrs), Avg:96.6 F (35.9 C), Min:95.8 F (35.4 C), Max:97.4 F (36.3 C)  Recent Labs  Lab 06/09/18 1214 06/09/18 1253 06/09/18 1448  WBC  --  29.4*  --   CREATININE  --  2.28*  --   LATICACIDVEN 8.50*  --  6.78*    Estimated Creatinine Clearance: 35.6 mL/min (A) (by C-G formula based on SCr of 2.28 mg/dL (H)).    Allergies  Allergen Reactions  . Codeine Nausea And Vomiting and Other (See Comments)    Severe nausea    Vanc 12/5 >> Cefepime 12/5 >>   12/5 BCx -   Ashten Sarnowski D. Mina Marble, PharmD, BCPS, [place] 06/09/2018, 3:53 PM

## 2018-06-09 NOTE — H&P (View-Only) (Signed)
[facility] CONSULT Reason for consult: GI bleeding Referring Physician: Emergency room.  PCP: Dr. Dena Billet.  Primary GI: Dr. Anson Fret  Antonio Wise is an 77 y.o. male.  HPI: He is a long-term patient of Dr. Estell Harpin.  He was seen several years ago after CT scan showed a large soft tissue mass in the ascending colon near the hepatic flexure.  Colonoscopy 4/17 revealed markedly inflamed and congested ascending colon mucosa but did not appear to be a tumor.  Biopsies were benign and a small polyp was removed.  This was felt to be infectious and the patient was treated with Cipro and Flagyl.  The patient reports that he has had intermittent blood in the stool with wiping for a couple years.  He has seen Dr. [name] back on a couple of occasions and a colonoscopy is been set up but he has had to cancel it at least twice because of respiratory problems.  He is a previous smoker and it looks like he had COPD exacerbation about a year ago with new onset of A. fib with RVR and is been subsequently placed on cardiac medicines and Xarelto.He saw Dr. [name] back 1/19 was having worsening bright red blood per rectum and elective colonoscopy was scheduled.  His hemoglobin at that time was 16.5.  Unfortunately the patient again developed medical problems and the colonoscopy had to be canceled.  He was to come back but did not.  Approximately a week after that office visit he was admitted with acute respiratory failure with COPD exacerbation and again A. fib with RVR.  It appears that he was placed on home O2.  It is notable that his saturation on room air according to EMS when they picked him up at home was only 60%The patient has been followed by Dr. Melvyn Novas in by Dr. Alvester Chou.  According to the notes he has chronic cor pulmonale and is on Spironolactone and Lasix to control this.  He states that he stopped smoking about a year ago.  He apparently continues to have progressive shortness of breath and whenever he has  bowel movements of bleeding that tends to make things worse. He reports that his hematochezia would come and go.  He denies constipation and if anything states that he tends to have somewhat looser stools.  He denies melena.  He does have a history of ulcers in the distant past and apparently had H. pylori and somewhere along the way was treated with antibiotics.  He notes that his hematochezia is been much worse over the past 2 weeks and has become progressively short of breath and presented to the emergency room with those symptoms.  Hemoglobin was 5.8 was 13.7 6 months ago.  Past Medical History:  Diagnosis Date  . Colon polyp 05/06/2008   repeat 5 years  . COPD (chronic obstructive pulmonary disease) ([place])   . Duodenal ulcer 05/06/2008   EGD  . Elevated PSA   . Gastric ulcer 05/06/2008   EGD  . H/O Helicobacter infection 05/06/2008   EGD  . H/O: GI bleed   . History of nuclear stress test 02/19/2010   bruce protocol; mild-mod perfusion defect in spetal region consistent with attenuation artifact; no significant ischemia demonstrated; dysrhythmia during exercise  . Hyperlipidemia   . Hypertension   . Peripheral vascular disease ([place])    a. s/p left common iliac artery stenting in 2006  . Persistent atrial fibrillation    a. on Xarelto  . Smoker unmotivated to  quit   . Vitamin D deficiency     Past Surgical History:  Procedure Laterality Date  . ILIAC VEIN ANGIOPLASTY / STENTING  05/19/2005   left CIA PTA & stenting   . TRANSTHORACIC ECHOCARDIOGRAM  06/10/2005   LV hyperdynamic; borderline LA enlargement; trace MR/TR/AVR    Family History  Problem Relation Age of Onset  . Heart disease Mother   . COPD Father        smoked and worked in a [employer]  . Cancer Father   . Cancer Sister 68       Breast Cancer  . Diabetes Brother     Social History:  reports that he quit smoking about 10 months ago. His smoking use included cigarettes. He has a 30.00 pack-year smoking history.  He has never used smokeless tobacco. He reports that he does not drink alcohol or use drugs.  Allergies:  Allergies  Allergen Reactions  . Codeine Nausea And Vomiting and Other (See Comments)    Severe nausea    Medications; Prior to Admission medications   Medication Sig Start Date End Date Taking? Authorizing Provider  acetaminophen (TYLENOL) 500 MG tablet Take 500 mg by mouth every 6 (six) hours as needed for moderate pain or headache.    Yes [provider]  albuterol (PROAIR HFA) 108 (90 Base) MCG/ACT inhaler 2 puffs every 4 hours as needed only  if your can't catch your breath Patient taking differently: Inhale 2 puffs into the lungs See admin instructions. Inhale 2 puffs into the lungs every 4 hours as needed- "only if your can't catch your breath" 10/26/17  Yes Tanda Rockers, MD  atorvastatin (LIPITOR) 40 MG tablet TAKE 1 TABLET(40 MG) BY MOUTH DAILY AT 6 PM Patient taking differently: Take 40 mg by mouth at bedtime.  03/02/18  Yes Lorretta Harp, MD  Cholecalciferol 4000 units CAPS Take 1 capsule (4,000 Units total) by mouth daily. 10/14/16  Yes Dena Billet B, PA-C  diltiazem (CARDIZEM CD) 240 MG 24 hr capsule TAKE 2 CAPSULES(480 MG) BY MOUTH DAILY Patient taking differently: Take 240 mg by mouth 2 (two) times daily.  04/05/18  Yes Dixon, Lonie Peak, PA-C  Fluticasone-Umeclidin-Vilant (TRELEGY ELLIPTA) 100-62.5-25 MCG/INH AEPB Inhale 1 puff into the lungs daily. 01/25/18  Yes Tanda Rockers, MD  furosemide (LASIX) 20 MG tablet TAKE 2 TABLETS BY MOUTH DAILY Patient taking differently: Take 40 mg by mouth daily.  04/26/18  Yes Lorretta Harp, MD  levalbuterol (XOPENEX) 0.63 MG/3ML nebulizer solution USE 1 VIAL VIA NEBULIZER EVERY 6 HOURS AS NEEDED FOR WHEEZING OR SHORTNESS OF BREATH Patient taking differently: Take 0.63 mg by nebulization every 6 (six) hours as needed for wheezing or shortness of breath.  03/29/18  Yes Tanda Rockers, MD  metoprolol tartrate (LOPRESSOR) 50 MG  tablet TAKE 1 TABLET BY MOUTH TWICE DAILY Patient taking differently: Take 50 mg by mouth 2 (two) times daily.  03/28/18  Yes Lorretta Harp, MD  OXYGEN Inhale 2 L into the lungs continuous.    Yes [provider]  XARELTO 20 MG TABS tablet TAKE 1 TABLET(20 MG) BY MOUTH DAILY WITH SUPPER Patient taking differently: Take 20 mg by mouth daily with supper.  05/27/18  Yes Lorretta Harp, MD  famotidine (PEPCID) 20 MG tablet Take 1 tablet (20 mg total) by mouth 2 (two) times daily as needed for heartburn. Patient not taking: Reported on 06/09/2018 07/23/17   Lavina Hamman, MD  spironolactone (ALDACTONE) 25  MG tablet TAKE 1 TABLET(25 MG) BY MOUTH TWICE DAILY Patient not taking: Reported on 06/09/2018 12/27/17   Tanda Rockers, MD    PRN Meds  Results for orders placed or performed during the hospital encounter of 06/09/18 (from the past 48 hour(s))  I-stat troponin, ED     Status: None   Collection Time: 06/09/18 12:12 PM  Result Value Ref Range   Troponin i, poc 0.01 0.00 - 0.08 ng/mL   Comment 3            Comment: Due to the release kinetics of cTnI, a negative result within the first hours of the onset of symptoms does not rule out myocardial infarction with certainty. If myocardial infarction is still suspected, repeat the test at appropriate intervals.   I-Stat CG4 Lactic Acid, ED     Status: Abnormal   Collection Time: 06/09/18 12:14 PM  Result Value Ref Range   Lactic Acid, Venous 8.50 (HH) 0.5 - 1.9 mmol/L   Comment NOTIFIED PHYSICIAN   I-Stat arterial blood gas, ED     Status: Abnormal   Collection Time: 06/09/18 12:18 PM  Result Value Ref Range   pH, Arterial 7.282 (L) 7.350 - 7.450   pCO2 arterial 54.3 (H) 32.0 - 48.0 mmHg   pO2, Arterial 141.0 (H) 83.0 - 108.0 mmHg   Bicarbonate 25.6 20.0 - 28.0 mmol/L   TCO2 27 22 - 32 mmol/L   O2 Saturation 99.0 %   Acid-base deficit 1.0 0.0 - 2.0 mmol/L   Patient temperature 98.6 F    Collection site RADIAL, ALLEN'S  TEST ACCEPTABLE    Sample type ARTERIAL   Comprehensive metabolic panel     Status: Abnormal   Collection Time: 06/09/18 12:53 PM  Result Value Ref Range   Sodium 143 135 - 145 mmol/L   Potassium 4.2 3.5 - 5.1 mmol/L   Chloride 100 98 - 111 mmol/L   CO2 22 22 - 32 mmol/L   Glucose, Bld 153 (H) 70 - 99 mg/dL   BUN 29 (H) 8 - 23 mg/dL   Creatinine, Ser 2.28 (H) 0.61 - 1.24 mg/dL   Calcium 8.5 (L) 8.9 - 10.3 mg/dL   Total Protein 6.6 6.5 - 8.1 g/dL   Albumin 3.8 3.5 - 5.0 g/dL   AST 327 (H) 15 - 41 U/L    Comment: RESULTS CONFIRMED BY MANUAL DILUTION   ALT 375 (H) 0 - 44 U/L   Alkaline Phosphatase 100 38 - 126 U/L   Total Bilirubin 1.8 (H) 0.3 - 1.2 mg/dL   GFR calc non Af Amer 27 (L) >60 mL/min   GFR calc Af Amer 31 (L) >60 mL/min   Anion gap 21 (H) 5 - 15    Comment: Performed at [facility], [address], [place], [place] [address]  CBC with Differential     Status: Abnormal   Collection Time: 06/09/18 12:53 PM  Result Value Ref Range   WBC 29.4 (H) 4.0 - 10.5 K/uL   RBC 3.22 (L) 4.22 - 5.81 MIL/uL   Hemoglobin 5.8 (LL) 13.0 - 17.0 g/dL    Comment: REPEATED TO VERIFY Reticulocyte Hemoglobin testing may be clinically indicated, consider ordering this additional test [id number] THIS CRITICAL RESULT HAS VERIFIED AND BEEN CALLED TO K COBB RN BY ALLISON BENNETT ON 12 05 2019 AT 1321, AND HAS BEEN READ BACK.  THIS CRITICAL RESULT HAS VERIFIED AND BEEN CALLED TO K COBB RN BY ALLISON BENNETT ON 12 05 2019 AT  1323, AND HAS BEEN READ BACK.     HCT 25.1 (L) 39.0 - 52.0 %   MCV 78.0 (L) 80.0 - 100.0 fL   MCH 18.0 (L) 26.0 - 34.0 pg   MCHC 23.1 (L) 30.0 - 36.0 g/dL   RDW 19.2 (H) 11.5 - 15.5 %   Platelets 793 (H) 150 - 400 K/uL   nRBC 2.5 (H) 0.0 - 0.2 %   Neutrophils Relative % 85 %   Neutro Abs 25.0 (H) 1.7 - 7.7 K/uL   Lymphocytes Relative 2 %   Lymphs Abs 0.6 (L) 0.7 - 4.0 K/uL   Monocytes Relative 12 %   Monocytes Absolute 3.5 (H) 0.1 - 1.0 K/uL   Eosinophils  Relative 0 %   Eosinophils Absolute 0.0 0.0 - 0.5 K/uL   Basophils Relative 1 %   Basophils Absolute 0.3 (H) 0.0 - 0.1 K/uL   nRBC 3 (H) 0 /100 WBC   Abs Immature Granulocytes 0.00 0.00 - 0.07 K/uL   Acanthocytes PRESENT    Burr Cells PRESENT     Comment: Performed at [facility], [place] [address], [place], [place] [address]  Brain natriuretic peptide     Status: Abnormal   Collection Time: 06/09/18 12:53 PM  Result Value Ref Range   B Natriuretic Peptide 543.8 (H) 0.0 - 100.0 pg/mL    Comment: Performed at [facility] [address], [place], [place] [address]  Prepare RBC     Status: None   Collection Time: 06/09/18  1:30 PM  Result Value Ref Range   Order Confirmation      ORDER PROCESSED BY BLOOD BANK Performed at [facility], [place] [address], [place], [place] [address]   I-Stat arterial blood gas, ED     Status: Abnormal   Collection Time: 06/09/18  1:51 PM  Result Value Ref Range   pH, Arterial 7.326 (L) 7.350 - 7.450   pCO2 arterial 54.3 (H) 32.0 - 48.0 mmHg   pO2, Arterial 140.0 (H) 83.0 - 108.0 mmHg   Bicarbonate 28.3 (H) 20.0 - 28.0 mmol/L   TCO2 30 22 - 32 mmol/L   O2 Saturation 99.0 %   Acid-Base Excess 2.0 0.0 - 2.0 mmol/L   Patient temperature 98.6 F    Collection site RADIAL, ALLEN'S TEST ACCEPTABLE    Sample type ARTERIAL   Type and screen [place]     Status: None (Preliminary result)   Collection Time: 06/09/18  1:53 PM  Result Value Ref Range   ABO/RH(D) A POS    Antibody Screen NEG    Sample Expiration 06/12/2018    Unit Number [id number]    Blood Component Type RED CELLS,LR    Unit division 00    Status of Unit ALLOCATED    Transfusion Status OK TO TRANSFUSE    Crossmatch Result      Compatible Performed at [facility], [address], [place], [place] [address]    Unit Number [id number]    Blood Component Type RED CELLS,LR    Unit division 00    Status of Unit ALLOCATED    Transfusion Status  OK TO TRANSFUSE    Crossmatch Result Compatible   ABO/Rh     Status: None   Collection Time: 06/09/18  1:53 PM  Result Value Ref Range   ABO/RH(D)      A POS Performed at [facility], [place] [address] [place], [place] [address]  I-Stat CG4 Lactic Acid, ED     Status: Abnormal   Collection Time: 06/09/18  2:48 PM  Result Value Ref Range   Lactic Acid, Venous 6.78 (HH) 0.5 - 1.9 mmol/L   Comment NOTIFIED PHYSICIAN     Dg Chest Port 1 View  Result Date: 06/09/2018 CLINICAL DATA:  Patient found down today.  History of COPD. EXAM: PORTABLE CHEST 1 VIEW COMPARISON:  PA and lateral chest 10/08/2017. FINDINGS: The lungs are emphysematous. Lung volumes are somewhat lower than on the comparison exams. Hazy airspace opacity is seen in the right lung base. The left lung is clear. No pneumothorax or pleural effusion. Heart size is normal. Aortic atherosclerosis is noted. No acute bony abnormality. IMPRESSION: Hazy opacity in the right lung base may be due to atelectasis in this low volume chest but could also be secondary to pneumonia or aspiration. Emphysema. Atherosclerosis. Electronically Signed   By: Inge Rise M.D.   On: 06/09/2018 12:47               Blood pressure 124/63, pulse 73, temperature (!) 95.8 F (35.4 C), temperature source Temporal, resp. rate 17, height 6\' 1"  (1.854 m), weight 105 kg, SpO2 (!) 79 %.  Physical exam:   General--pale white male ENT--nonicteric Neck--supple without LNs Heart--no murmers, irregular Lungs--clear anteriorly and posteriorly Abdomen--soft and completely nontender Psych--alert and oriented answers questions appropriately   Assessment: 1.  Lower GI bleed.  This appears to be chronic and is manifested as bright red blood with wiping after bowel movement.  Certainly this is been worsened by Xarelto.  He has had previous polyps and I certainly think it be reasonable to do a colonoscopy but he has multiple other problems. 2.   History of GERD.  He is not on any medications routinely but does take something over-the-counter on a as needed basis 3.  PAF on chronic anticoagulation 4.  Severe COPD smoked for many years and worked in a [employer].  Just quit smoking within the past year.  Has had episodes of respiratory failure requiring admission and is on chronic home O2 5.  Peripheral vascular disease with previous stenting  Plan: 1.  We will follow with you.  He does not appear to be bleeding heavily and will need to be off the Xarelto for several days before any type of diagnostic testing would be possible. 2.  I would empirically give him PPI orally and go ahead and give him liquids now since it will take several days to get the Xarelto out of his system.   Nancy Fetter 06/09/2018, 3:20 PM   This note was created using voice recognition software and minor errors may Have occurred unintentionally. Pager: [phone] If no answer or after hours call [phone]

## 2018-06-09 NOTE — Consult Note (Signed)
[facility] CONSULT Reason for consult: GI bleeding Referring Physician: Emergency room.  PCP: Dr. Dena Billet.  Primary GI: Dr. Anson Fret  Antonio Wise is an 77 y.o. male.  HPI: He is a long-term patient of Dr. Estell Harpin.  He was seen several years ago after CT scan showed a large soft tissue mass in the ascending colon near the hepatic flexure.  Colonoscopy 4/17 revealed markedly inflamed and congested ascending colon mucosa but did not appear to be a tumor.  Biopsies were benign and a small polyp was removed.  This was felt to be infectious and the patient was treated with Cipro and Flagyl.  The patient reports that he has had intermittent blood in the stool with wiping for a couple years.  He has seen Dr. [name] back on a couple of occasions and a colonoscopy is been set up but he has had to cancel it at least twice because of respiratory problems.  He is a previous smoker and it looks like he had COPD exacerbation about a year ago with new onset of A. fib with RVR and is been subsequently placed on cardiac medicines and Xarelto.He saw Dr. [name] back 1/19 was having worsening bright red blood per rectum and elective colonoscopy was scheduled.  His hemoglobin at that time was 16.5.  Unfortunately the patient again developed medical problems and the colonoscopy had to be canceled.  He was to come back but did not.  Approximately a week after that office visit he was admitted with acute respiratory failure with COPD exacerbation and again A. fib with RVR.  It appears that he was placed on home O2.  It is notable that his saturation on room air according to EMS when they picked him up at home was only 60%The patient has been followed by Dr. Melvyn Novas in by Dr. Alvester Chou.  According to the notes he has chronic cor pulmonale and is on Spironolactone and Lasix to control this.  He states that he stopped smoking about a year ago.  He apparently continues to have progressive shortness of breath and whenever he has  bowel movements of bleeding that tends to make things worse. He reports that his hematochezia would come and go.  He denies constipation and if anything states that he tends to have somewhat looser stools.  He denies melena.  He does have a history of ulcers in the distant past and apparently had H. pylori and somewhere along the way was treated with antibiotics.  He notes that his hematochezia is been much worse over the past 2 weeks and has become progressively short of breath and presented to the emergency room with those symptoms.  Hemoglobin was 5.8 was 13.7 6 months ago.  Past Medical History:  Diagnosis Date  . Colon polyp 05/06/2008   repeat 5 years  . COPD (chronic obstructive pulmonary disease) ([place])   . Duodenal ulcer 05/06/2008   EGD  . Elevated PSA   . Gastric ulcer 05/06/2008   EGD  . H/O Helicobacter infection 05/06/2008   EGD  . H/O: GI bleed   . History of nuclear stress test 02/19/2010   bruce protocol; mild-mod perfusion defect in spetal region consistent with attenuation artifact; no significant ischemia demonstrated; dysrhythmia during exercise  . Hyperlipidemia   . Hypertension   . Peripheral vascular disease ([place])    a. s/p left common iliac artery stenting in 2006  . Persistent atrial fibrillation    a. on Xarelto  . Smoker unmotivated to  quit   . Vitamin D deficiency     Past Surgical History:  Procedure Laterality Date  . ILIAC VEIN ANGIOPLASTY / STENTING  05/19/2005   left CIA PTA & stenting   . TRANSTHORACIC ECHOCARDIOGRAM  06/10/2005   LV hyperdynamic; borderline LA enlargement; trace MR/TR/AVR    Family History  Problem Relation Age of Onset  . Heart disease Mother   . COPD Father        smoked and worked in a [employer]  . Cancer Father   . Cancer Sister 47       Breast Cancer  . Diabetes Brother     Social History:  reports that he quit smoking about 10 months ago. His smoking use included cigarettes. He has a 30.00 pack-year smoking history.  He has never used smokeless tobacco. He reports that he does not drink alcohol or use drugs.  Allergies:  Allergies  Allergen Reactions  . Codeine Nausea And Vomiting and Other (See Comments)    Severe nausea    Medications; Prior to Admission medications   Medication Sig Start Date End Date Taking? Authorizing Provider  acetaminophen (TYLENOL) 500 MG tablet Take 500 mg by mouth every 6 (six) hours as needed for moderate pain or headache.    Yes [provider]  albuterol (PROAIR HFA) 108 (90 Base) MCG/ACT inhaler 2 puffs every 4 hours as needed only  if your can't catch your breath Patient taking differently: Inhale 2 puffs into the lungs See admin instructions. Inhale 2 puffs into the lungs every 4 hours as needed- "only if your can't catch your breath" 10/26/17  Yes Tanda Rockers, MD  atorvastatin (LIPITOR) 40 MG tablet TAKE 1 TABLET(40 MG) BY MOUTH DAILY AT 6 PM Patient taking differently: Take 40 mg by mouth at bedtime.  03/02/18  Yes Lorretta Harp, MD  Cholecalciferol 4000 units CAPS Take 1 capsule (4,000 Units total) by mouth daily. 10/14/16  Yes Dena Billet B, PA-C  diltiazem (CARDIZEM CD) 240 MG 24 hr capsule TAKE 2 CAPSULES(480 MG) BY MOUTH DAILY Patient taking differently: Take 240 mg by mouth 2 (two) times daily.  04/05/18  Yes Dixon, Lonie Peak, PA-C  Fluticasone-Umeclidin-Vilant (TRELEGY ELLIPTA) 100-62.5-25 MCG/INH AEPB Inhale 1 puff into the lungs daily. 01/25/18  Yes Tanda Rockers, MD  furosemide (LASIX) 20 MG tablet TAKE 2 TABLETS BY MOUTH DAILY Patient taking differently: Take 40 mg by mouth daily.  04/26/18  Yes Lorretta Harp, MD  levalbuterol (XOPENEX) 0.63 MG/3ML nebulizer solution USE 1 VIAL VIA NEBULIZER EVERY 6 HOURS AS NEEDED FOR WHEEZING OR SHORTNESS OF BREATH Patient taking differently: Take 0.63 mg by nebulization every 6 (six) hours as needed for wheezing or shortness of breath.  03/29/18  Yes Tanda Rockers, MD  metoprolol tartrate (LOPRESSOR) 50 MG  tablet TAKE 1 TABLET BY MOUTH TWICE DAILY Patient taking differently: Take 50 mg by mouth 2 (two) times daily.  03/28/18  Yes Lorretta Harp, MD  OXYGEN Inhale 2 L into the lungs continuous.    Yes [provider]  XARELTO 20 MG TABS tablet TAKE 1 TABLET(20 MG) BY MOUTH DAILY WITH SUPPER Patient taking differently: Take 20 mg by mouth daily with supper.  05/27/18  Yes Lorretta Harp, MD  famotidine (PEPCID) 20 MG tablet Take 1 tablet (20 mg total) by mouth 2 (two) times daily as needed for heartburn. Patient not taking: Reported on 06/09/2018 07/23/17   Lavina Hamman, MD  spironolactone (ALDACTONE) 25  MG tablet TAKE 1 TABLET(25 MG) BY MOUTH TWICE DAILY Patient not taking: Reported on 06/09/2018 12/27/17   Tanda Rockers, MD    PRN Meds  Results for orders placed or performed during the hospital encounter of 06/09/18 (from the past 48 hour(s))  I-stat troponin, ED     Status: None   Collection Time: 06/09/18 12:12 PM  Result Value Ref Range   Troponin i, poc 0.01 0.00 - 0.08 ng/mL   Comment 3            Comment: Due to the release kinetics of cTnI, a negative result within the first hours of the onset of symptoms does not rule out myocardial infarction with certainty. If myocardial infarction is still suspected, repeat the test at appropriate intervals.   I-Stat CG4 Lactic Acid, ED     Status: Abnormal   Collection Time: 06/09/18 12:14 PM  Result Value Ref Range   Lactic Acid, Venous 8.50 (HH) 0.5 - 1.9 mmol/L   Comment NOTIFIED PHYSICIAN   I-Stat arterial blood gas, ED     Status: Abnormal   Collection Time: 06/09/18 12:18 PM  Result Value Ref Range   pH, Arterial 7.282 (L) 7.350 - 7.450   pCO2 arterial 54.3 (H) 32.0 - 48.0 mmHg   pO2, Arterial 141.0 (H) 83.0 - 108.0 mmHg   Bicarbonate 25.6 20.0 - 28.0 mmol/L   TCO2 27 22 - 32 mmol/L   O2 Saturation 99.0 %   Acid-base deficit 1.0 0.0 - 2.0 mmol/L   Patient temperature 98.6 F    Collection site RADIAL, [facility]'S  TEST ACCEPTABLE    Sample type ARTERIAL   Comprehensive metabolic panel     Status: Abnormal   Collection Time: 06/09/18 12:53 PM  Result Value Ref Range   Sodium 143 135 - 145 mmol/L   Potassium 4.2 3.5 - 5.1 mmol/L   Chloride 100 98 - 111 mmol/L   CO2 22 22 - 32 mmol/L   Glucose, Bld 153 (H) 70 - 99 mg/dL   BUN 29 (H) 8 - 23 mg/dL   Creatinine, Ser 2.28 (H) 0.61 - 1.24 mg/dL   Calcium 8.5 (L) 8.9 - 10.3 mg/dL   Total Protein 6.6 6.5 - 8.1 g/dL   Albumin 3.8 3.5 - 5.0 g/dL   AST 327 (H) 15 - 41 U/L    Comment: RESULTS CONFIRMED BY MANUAL DILUTION   ALT 375 (H) 0 - 44 U/L   Alkaline Phosphatase 100 38 - 126 U/L   Total Bilirubin 1.8 (H) 0.3 - 1.2 mg/dL   GFR calc non Af Amer 27 (L) >60 mL/min   GFR calc Af Amer 31 (L) >60 mL/min   Anion gap 21 (H) 5 - 15    Comment: Performed at [facility], [address], [place], [place] [address]  CBC with Differential     Status: Abnormal   Collection Time: 06/09/18 12:53 PM  Result Value Ref Range   WBC 29.4 (H) 4.0 - 10.5 K/uL   RBC 3.22 (L) 4.22 - 5.81 MIL/uL   Hemoglobin 5.8 (LL) 13.0 - 17.0 g/dL    Comment: REPEATED TO VERIFY Reticulocyte Hemoglobin testing may be clinically indicated, consider ordering this additional test [id number] THIS CRITICAL RESULT HAS VERIFIED AND BEEN CALLED TO K COBB RN BY ALLISON BENNETT ON 12 05 2019 AT 1321, AND HAS BEEN READ BACK.  THIS CRITICAL RESULT HAS VERIFIED AND BEEN CALLED TO K COBB RN BY ALLISON BENNETT ON 12 05 2019 AT  1323, AND HAS BEEN READ BACK.     HCT 25.1 (L) 39.0 - 52.0 %   MCV 78.0 (L) 80.0 - 100.0 fL   MCH 18.0 (L) 26.0 - 34.0 pg   MCHC 23.1 (L) 30.0 - 36.0 g/dL   RDW 19.2 (H) 11.5 - 15.5 %   Platelets 793 (H) 150 - 400 K/uL   nRBC 2.5 (H) 0.0 - 0.2 %   Neutrophils Relative % 85 %   Neutro Abs 25.0 (H) 1.7 - 7.7 K/uL   Lymphocytes Relative 2 %   Lymphs Abs 0.6 (L) 0.7 - 4.0 K/uL   Monocytes Relative 12 %   Monocytes Absolute 3.5 (H) 0.1 - 1.0 K/uL   Eosinophils  Relative 0 %   Eosinophils Absolute 0.0 0.0 - 0.5 K/uL   Basophils Relative 1 %   Basophils Absolute 0.3 (H) 0.0 - 0.1 K/uL   nRBC 3 (H) 0 /100 WBC   Abs Immature Granulocytes 0.00 0.00 - 0.07 K/uL   Acanthocytes PRESENT    Burr Cells PRESENT     Comment: Performed at [facility], [place] [address] [place], [place] [address]  Brain natriuretic peptide     Status: Abnormal   Collection Time: 06/09/18 12:53 PM  Result Value Ref Range   B Natriuretic Peptide 543.8 (H) 0.0 - 100.0 pg/mL    Comment: Performed at [place] [address], [place], [place] [address]  Prepare RBC     Status: None   Collection Time: 06/09/18  1:30 PM  Result Value Ref Range   Order Confirmation      ORDER PROCESSED BY BLOOD BANK Performed at [facility], [place] [address] [place], [place] [address]   I-Stat arterial blood gas, ED     Status: Abnormal   Collection Time: 06/09/18  1:51 PM  Result Value Ref Range   pH, Arterial 7.326 (L) 7.350 - 7.450   pCO2 arterial 54.3 (H) 32.0 - 48.0 mmHg   pO2, Arterial 140.0 (H) 83.0 - 108.0 mmHg   Bicarbonate 28.3 (H) 20.0 - 28.0 mmol/L   TCO2 30 22 - 32 mmol/L   O2 Saturation 99.0 %   Acid-Base Excess 2.0 0.0 - 2.0 mmol/L   Patient temperature 98.6 F    Collection site RADIAL, [facility]'S TEST ACCEPTABLE    Sample type ARTERIAL   Type and screen [place]     Status: None (Preliminary result)   Collection Time: 06/09/18  1:53 PM  Result Value Ref Range   ABO/RH(D) A POS    Antibody Screen NEG    Sample Expiration 06/12/2018    Unit Number [id number]    Blood Component Type RED CELLS,LR    Unit division 00    Status of Unit ALLOCATED    Transfusion Status OK TO TRANSFUSE    Crossmatch Result      Compatible Performed at [facility], [address] [place], [place] [address]    Unit Number [id number]    Blood Component Type RED CELLS,LR    Unit division 00    Status of Unit ALLOCATED    Transfusion Status  OK TO TRANSFUSE    Crossmatch Result Compatible   ABO/Rh     Status: None   Collection Time: 06/09/18  1:53 PM  Result Value Ref Range   ABO/RH(D)      A POS Performed at [facility], [place] [address] [place], [place] [address]  I-Stat CG4 Lactic Acid, ED     Status: Abnormal   Collection Time: 06/09/18  2:48 PM  Result Value Ref Range   Lactic Acid, Venous 6.78 (HH) 0.5 - 1.9 mmol/L   Comment NOTIFIED PHYSICIAN     Dg Chest Port 1 View  Result Date: 06/09/2018 CLINICAL DATA:  Patient found down today.  History of COPD. EXAM: PORTABLE CHEST 1 VIEW COMPARISON:  PA and lateral chest 10/08/2017. FINDINGS: The lungs are emphysematous. Lung volumes are somewhat lower than on the comparison exams. Hazy airspace opacity is seen in the right lung base. The left lung is clear. No pneumothorax or pleural effusion. Heart size is normal. Aortic atherosclerosis is noted. No acute bony abnormality. IMPRESSION: Hazy opacity in the right lung base may be due to atelectasis in this low volume chest but could also be secondary to pneumonia or aspiration. Emphysema. Atherosclerosis. Electronically Signed   By: Inge Rise M.D.   On: 06/09/2018 12:47               Blood pressure 124/63, pulse 73, temperature (!) 95.8 F (35.4 C), temperature source Temporal, resp. rate 17, height 6\' 1"  (1.854 m), weight 105 kg, SpO2 (!) 79 %.  Physical exam:   General--pale white male ENT--nonicteric Neck--supple without LNs Heart--no murmers, irregular Lungs--clear anteriorly and posteriorly Abdomen--soft and completely nontender Psych--alert and oriented answers questions appropriately   Assessment: 1.  Lower GI bleed.  This appears to be chronic and is manifested as bright red blood with wiping after bowel movement.  Certainly this is been worsened by Xarelto.  He has had previous polyps and I certainly think it be reasonable to do a colonoscopy but he has multiple other problems. 2.   History of GERD.  He is not on any medications routinely but does take something over-the-counter on a as needed basis 3.  PAF on chronic anticoagulation 4.  Severe COPD smoked for many years and worked in a [employer].  Just quit smoking within the past year.  Has had episodes of respiratory failure requiring admission and is on chronic home O2 5.  Peripheral vascular disease with previous stenting  Plan: 1.  We will follow with you.  He does not appear to be bleeding heavily and will need to be off the Xarelto for several days before any type of diagnostic testing would be possible. 2.  I would empirically give him PPI orally and go ahead and give him liquids now since it will take several days to get the Xarelto out of his system.   Nancy Fetter 06/09/2018, 3:20 PM   This note was created using voice recognition software and minor errors may Have occurred unintentionally. Pager: [phone] If no answer or after hours call [phone]

## 2018-06-09 NOTE — ED Notes (Signed)
Patient denies pain.

## 2018-06-09 NOTE — ED Provider Notes (Signed)
[facility] EMERGENCY DEPARTMENT Provider Note   CSN: [id number] Arrival date & time: 06/09/18  1203     History   Chief Complaint Chief Complaint  Patient presents with  . Respiratory Distress    HPI Antonio Wise is a 77 y.o. male.  Patient is a 77 year old male with past medical history of COPD, hypertension, atrial fibrillation on Xarelto, and chronic GI bleed.  He presents today for evaluation of shortness of breath.  This is worsened over the past several days, then the patient was found today by family on the floor.  He was extremely weak and having difficulty breathing.  EMS arrived and the patient was placed on CPAP, then transported here.  He denies any chest pain but does admit to shortness of breath.  He denies any fevers, chills, or productive cough.  According to the family, this patient has been experiencing bloody stools for nearly 1 year.  He was to have a colonoscopy performed, however has had health issues in the meantime that have delayed this.  The history is provided by the patient.    Past Medical History:  Diagnosis Date  . Colon polyp 05/06/2008   repeat 5 years  . COPD (chronic obstructive pulmonary disease) ([facility])   . Duodenal ulcer 05/06/2008   EGD  . Elevated PSA   . Gastric ulcer 05/06/2008   EGD  . H/O Helicobacter infection 05/06/2008   EGD  . H/O: GI bleed   . History of nuclear stress test 02/19/2010   bruce protocol; mild-mod perfusion defect in spetal region consistent with attenuation artifact; no significant ischemia demonstrated; dysrhythmia during exercise  . Hyperlipidemia   . Hypertension   . Peripheral vascular disease ([place])    a. s/p left common iliac artery stenting in 2006  . Persistent atrial fibrillation    a. on Xarelto  . Smoker unmotivated to quit   . Vitamin D deficiency     Patient Active Problem List   Diagnosis Date Noted  . Chronic respiratory failure with hypoxia ([place]) 10/28/2017  . Chronic cor  pulmonale ([place]) 10/10/2017  . DOE (dyspnea on exertion) 10/08/2017  . COPD  GOLD IV  09/03/2017  . Acute respiratory failure ([place]) 07/19/2017  . Acute kidney injury ([place]) 07/19/2017  . Current use of long term anticoagulation 03/01/2017  . Chronic atrial fibrillation 02/07/2017  . Acute respiratory failure with hypoxia and hypercapnia ([place]) 02/07/2017  . Light smoker 06/05/2015  . Screening for colorectal cancer 06/14/2013  . Hyperglycemia 06/14/2013  . Essential hypertension   . Hyperlipidemia LDL goal <70   . Vitamin D deficiency   . Smoker unmotivated to quit   . Peripheral vascular disease ([place])   . Paroxysmal atrial fibrillation ([facility])   . Elevated PSA   . COPD exacerbation [facility])     Past Surgical History:  Procedure Laterality Date  . ILIAC VEIN ANGIOPLASTY / STENTING  05/19/2005   left CIA PTA & stenting   . TRANSTHORACIC ECHOCARDIOGRAM  06/10/2005   LV hyperdynamic; borderline LA enlargement; trace MR/TR/AVR        Home Medications    Prior to Admission medications   Medication Sig Start Date End Date Taking? Authorizing Provider  acetaminophen (TYLENOL) 500 MG tablet Take 500 mg by mouth every 6 (six) hours as needed for moderate pain or headache.     [provider]  albuterol (PROAIR HFA) 108 (90 Base) MCG/ACT inhaler 2 puffs every 4 hours as needed only  if your  can't catch your breath 10/26/17   Tanda Rockers, MD  atorvastatin (LIPITOR) 40 MG tablet TAKE 1 TABLET(40 MG) BY MOUTH DAILY AT 6 PM 03/02/18   Lorretta Harp, MD  Cholecalciferol 4000 units CAPS Take 1 capsule (4,000 Units total) by mouth daily. 10/14/16   [name], PA-C  diltiazem (CARDIZEM CD) 240 MG 24 hr capsule TAKE 2 CAPSULES(480 MG) BY MOUTH DAILY 04/05/18   Dena Billet B, PA-C  famotidine (PEPCID) 20 MG tablet Take 1 tablet (20 mg total) by mouth 2 (two) times daily as needed for heartburn. 07/23/17   Lavina Hamman, MD  Fluticasone-Umeclidin-Vilant (TRELEGY ELLIPTA) 100-62.5-25  MCG/INH AEPB Inhale 1 puff into the lungs daily. 01/25/18   Tanda Rockers, MD  furosemide (LASIX) 20 MG tablet TAKE 2 TABLETS BY MOUTH DAILY 04/26/18   Lorretta Harp, MD  levalbuterol [facility]) 0.63 MG/3ML nebulizer solution USE 1 VIAL VIA NEBULIZER EVERY 6 HOURS AS NEEDED FOR WHEEZING OR SHORTNESS OF BREATH 03/29/18   Tanda Rockers, MD  metoprolol tartrate (LOPRESSOR) 50 MG tablet TAKE 1 TABLET BY MOUTH TWICE DAILY 03/28/18   Lorretta Harp, MD  OXYGEN 2lpm 24/7  [facility]    [provider]  spironolactone (ALDACTONE) 25 MG tablet TAKE 1 TABLET(25 MG) BY MOUTH TWICE DAILY 12/27/17   Tanda Rockers, MD  XARELTO 20 MG TABS tablet TAKE 1 TABLET(20 MG) BY MOUTH DAILY WITH SUPPER 05/27/18   Lorretta Harp, MD    Family History Family History  Problem Relation Age of Onset  . Heart disease Mother   . COPD Father        smoked and worked in a [employer]  . Cancer Father   . Cancer Sister 37       Breast Cancer  . Diabetes Brother     Social History Social History   Tobacco Use  . Smoking status: Former Smoker    Packs/day: 1.00    Years: 30.00    Pack years: 30.00    Types: Cigarettes    Last attempt to quit: 07/15/2017    Years since quitting: 0.9  . Smokeless tobacco: Never Used  Substance Use Topics  . Alcohol use: No  . Drug use: No     Allergies   Codeine   Review of Systems Review of Systems  All other systems reviewed and are negative.    Physical Exam Updated Vital Signs BP 124/63   Pulse 73   Temp (!) 95.8 F (35.4 C) (Temporal)   Resp 17   Ht 6\' 1"  (1.854 m)   Wt 105 kg   SpO2 (!) 79%   BMI 30.54 kg/m   Physical Exam  Constitutional: He is oriented to person, place, and time. He appears well-developed and well-nourished. No distress.  Patient is a pale appearing 77 year old male.  He is in significant respiratory distress upon arrival.  HENT:  Head: Normocephalic and atraumatic.  Mouth/Throat: Oropharynx is clear and moist.    Neck: Normal range of motion. Neck supple.  Cardiovascular: Normal rate and regular rhythm. Exam reveals no friction rub.  No murmur heard. Pulmonary/Chest: Breath sounds normal. He is in respiratory distress. He has no wheezes. He has no rales. He exhibits no tenderness.  Breath sounds are rhonchorous with accessory muscle use.  Abdominal: Soft. Bowel sounds are normal. He exhibits no distension. There is no tenderness.  Musculoskeletal: Normal range of motion. He exhibits edema.  There is trace edema of both lower  extremities.  Neurological: He is alert and oriented to person, place, and time. Coordination normal.  Skin: Skin is warm and dry. He is not diaphoretic.  Nursing note and vitals reviewed.    ED Treatments / Results  Labs (all labs ordered are listed, but only abnormal results are displayed) Labs Reviewed  COMPREHENSIVE METABOLIC PANEL - Abnormal; Notable for the following components:      Result Value   Glucose, Bld 153 (*)    BUN 29 (*)    Creatinine, Ser 2.28 (*)    Calcium 8.5 (*)    ALT 375 (*)    Total Bilirubin 1.8 (*)    GFR calc non Af Amer 27 (*)    GFR calc Af Amer 31 (*)    Anion gap 21 (*)    All other components within normal limits  CBC WITH DIFFERENTIAL/PLATELET - Abnormal; Notable for the following components:   WBC 29.4 (*)    RBC 3.22 (*)    Hemoglobin 5.8 (*)    HCT 25.1 (*)    MCV 78.0 (*)    MCH 18.0 (*)    MCHC 23.1 (*)    RDW 19.2 (*)    Platelets 793 (*)    nRBC 2.5 (*)    Neutro Abs 25.0 (*)    Lymphs Abs 0.6 (*)    Monocytes Absolute 3.5 (*)    Basophils Absolute 0.3 (*)    nRBC 3 (*)    All other components within normal limits  I-STAT CG4 LACTIC ACID, ED - Abnormal; Notable for the following components:   Lactic Acid, Venous 8.50 (*)    All other components within normal limits  I-STAT ARTERIAL BLOOD GAS, ED - Abnormal; Notable for the following components:   pH, Arterial 7.282 (*)    pCO2 arterial 54.3 (*)    pO2,  Arterial 141.0 (*)    All other components within normal limits  I-STAT ARTERIAL BLOOD GAS, ED - Abnormal; Notable for the following components:   pH, Arterial 7.326 (*)    pCO2 arterial 54.3 (*)    pO2, Arterial 140.0 (*)    Bicarbonate 28.3 (*)    All other components within normal limits  CULTURE, BLOOD (ROUTINE X 2)  CULTURE, BLOOD (ROUTINE X 2)  BLOOD GAS, ARTERIAL  BRAIN NATRIURETIC PEPTIDE  BLOOD GAS, ARTERIAL  I-STAT TROPONIN, ED  I-STAT CG4 LACTIC ACID, ED  PREPARE RBC (CROSSMATCH)  TYPE AND SCREEN    EKG EKG Interpretation  Date/Time:  Thursday June 09 2018 12:17:30 EST Ventricular Rate:  119 PR Interval:    QRS Duration: 96 QT Interval:  341 QTC Calculation: 480 R Axis:   72 Text Interpretation:  Atrial fibrillation Low voltage, extremity and precordial leads Probable anterolateral infarct, old Minimal ST depression, inferior leads Confirmed by Veryl Speak [phone]) on 06/09/2018 1:05:11 PM   Radiology Dg Chest Port 1 View  Result Date: 06/09/2018 CLINICAL DATA:  Patient found down today.  History of COPD. EXAM: PORTABLE CHEST 1 VIEW COMPARISON:  PA and lateral chest 10/08/2017. FINDINGS: The lungs are emphysematous. Lung volumes are somewhat lower than on the comparison exams. Hazy airspace opacity is seen in the right lung base. The left lung is clear. No pneumothorax or pleural effusion. Heart size is normal. Aortic atherosclerosis is noted. No acute bony abnormality. IMPRESSION: Hazy opacity in the right lung base may be due to atelectasis in this low volume chest but could also be secondary to pneumonia or aspiration. Emphysema. Atherosclerosis. Electronically  Signed   By: Inge Rise M.D.   On: 06/09/2018 12:47    Procedures Procedures (including critical care time)  Medications Ordered in ED Medications  vancomycin (VANCOCIN) IVPB 1000 mg/200 mL premix (has no administration in time range)  piperacillin-tazobactam (ZOSYN) IVPB 3.375 g (has no  administration in time range)  0.9 %  sodium chloride infusion (has no administration in time range)  methylPREDNISolone sodium succinate (SOLU-MEDROL) 125 mg/2 mL injection 125 mg (125 mg Intravenous Given 06/09/18 1233)  ipratropium-albuterol (DUONEB) 0.5-2.5 (3) MG/3ML nebulizer solution 3 mL (3 mLs Nebulization Given 06/09/18 1226)  ipratropium-albuterol (DUONEB) 0.5-2.5 (3) MG/3ML nebulizer solution 3 mL (3 mLs Nebulization Given 06/09/18 1226)     Initial Impression / Assessment and Plan / ED Course  I have reviewed the triage vital signs and the nursing notes.  Pertinent labs & imaging results that were available during my care of the patient were reviewed by me and considered in my medical decision making (see chart for details).  Patient is a 77 year old male with past medical history of COPD presenting with weakness, syncope, and shortness of breath.  He was initially found to be in respiratory distress and hypoxic.  He was placed on BiPAP, then brought here.  Patient arrived here in significant respiratory distress.  He was placed on BiPAP and was maintained on BiPAP for approximately 1 hour.  His blood gas remained essentially unchanged with a CO2 of 52, however clinically he felt and looked much better.  He was then placed on nasal cannula and seemed to tolerate this well.  His work-up reveals an elevated white count of 29,000.  Blood cultures were obtained and the patient was given Vanco and Zosyn for possible infection.    He was also found to have a hemoglobin of 5.8.  According to the family, this patient has been having intermittent GI bleeding for the past year.  He was to undergo colonoscopy, however this was repeatedly delayed secondary to unrelated medical conditions.  He is currently taking Xarelto.  Patient will be transfused 2 units of red blood cells and admitted to the hospitalist service for further work-up and treatment.  CRITICAL CARE Performed by: Veryl Speak Total critical care time: 45 minutes Critical care time was exclusive of separately billable procedures and treating other patients. Critical care was necessary to treat or prevent imminent or life-threatening deterioration. Critical care was time spent personally by me on the following activities: development of treatment plan with patient and/or surrogate as well as nursing, discussions with consultants, evaluation of patient's response to treatment, examination of patient, obtaining history from patient or surrogate, ordering and performing treatments and interventions, ordering and review of laboratory studies, ordering and review of radiographic studies, pulse oximetry and re-evaluation of patient's condition.   Final Clinical Impressions(s) / ED Diagnoses   Final diagnoses:  None    ED Discharge Orders    None       Veryl Speak, MD 06/09/18 1529

## 2018-06-09 NOTE — ED Triage Notes (Signed)
To ED via [facility] - found at home by family on floor- unsure how long pt was on floor- was responsive on EMS arrival,  CBG= 127,  On arrival pt is on CPap-- pt was 50% while on duoneb for EMS

## 2018-06-09 NOTE — ED Notes (Signed)
Admitting at bedside 

## 2018-06-10 ENCOUNTER — Encounter (HOSPITAL_COMMUNITY): Payer: Self-pay

## 2018-06-10 LAB — COMPREHENSIVE METABOLIC PANEL
ALT: 308 U/L — AB (ref 0–44)
AST: 222 U/L — AB (ref 15–41)
Albumin: 3.5 g/dL (ref 3.5–5.0)
Alkaline Phosphatase: 87 U/L (ref 38–126)
Anion gap: 15 (ref 5–15)
BUN: 33 mg/dL — AB (ref 8–23)
CHLORIDE: 99 mmol/L (ref 98–111)
CO2: 25 mmol/L (ref 22–32)
CREATININE: 2.19 mg/dL — AB (ref 0.61–1.24)
Calcium: 8.3 mg/dL — ABNORMAL LOW (ref 8.9–10.3)
GFR calc Af Amer: 33 mL/min — ABNORMAL LOW (ref >60)
GFR, EST NON AFRICAN AMERICAN: 28 mL/min — AB (ref >60)
Glucose, Bld: 215 mg/dL — ABNORMAL HIGH (ref 70–99)
Potassium: 3.6 mmol/L (ref 3.5–5.1)
Sodium: 139 mmol/L (ref 135–145)
Total Bilirubin: 1.7 mg/dL — ABNORMAL HIGH (ref 0.3–1.2)
Total Protein: 6.2 g/dL — ABNORMAL LOW (ref 6.5–8.1)

## 2018-06-10 LAB — CBC
HCT: 28.4 % — ABNORMAL LOW (ref 39.0–52.0)
Hemoglobin: 7.7 g/dL — ABNORMAL LOW (ref 13.0–17.0)
MCH: 20.5 pg — ABNORMAL LOW (ref 26.0–34.0)
MCHC: 27.1 g/dL — ABNORMAL LOW (ref 30.0–36.0)
MCV: 75.5 fL — ABNORMAL LOW (ref 80.0–100.0)
Platelets: 412 10*3/uL — ABNORMAL HIGH (ref 150–400)
RBC: 3.76 MIL/uL — ABNORMAL LOW (ref 4.22–5.81)
RDW: 18.6 % — ABNORMAL HIGH (ref 11.5–15.5)
WBC: 12.9 10*3/uL — ABNORMAL HIGH (ref 4.0–10.5)
nRBC: 1.1 % — ABNORMAL HIGH (ref 0.0–0.2)

## 2018-06-10 LAB — PREPARE RBC (CROSSMATCH)

## 2018-06-10 LAB — PROTIME-INR
INR: 6
Prothrombin Time: 52.5 seconds — ABNORMAL HIGH (ref 11.4–15.2)

## 2018-06-10 LAB — MRSA PCR SCREENING: MRSA by PCR: NEGATIVE

## 2018-06-10 LAB — PROCALCITONIN: Procalcitonin: 1.9 ng/mL

## 2018-06-10 LAB — PATHOLOGIST SMEAR REVIEW

## 2018-06-10 MED ORDER — SODIUM CHLORIDE 0.9 % IV SOLN
510.0000 mg | Freq: Once | INTRAVENOUS | Status: AC
  Administered 2018-06-10: 510 mg via INTRAVENOUS
  Filled 2018-06-10 (×2): qty 17

## 2018-06-10 MED ORDER — BOOST / RESOURCE BREEZE PO LIQD CUSTOM
1.0000 | Freq: Three times a day (TID) | ORAL | Status: DC
  Administered 2018-06-10 – 2018-06-16 (×13): 1 via ORAL

## 2018-06-10 MED ORDER — INFLUENZA VAC SPLIT HIGH-DOSE 0.5 ML IM SUSY
0.5000 mL | PREFILLED_SYRINGE | INTRAMUSCULAR | Status: AC
  Administered 2018-06-11: 0.5 mL via INTRAMUSCULAR
  Filled 2018-06-10: qty 0.5

## 2018-06-10 MED ORDER — ADULT MULTIVITAMIN W/MINERALS CH
1.0000 | ORAL_TABLET | Freq: Every day | ORAL | Status: DC
  Administered 2018-06-10 – 2018-06-16 (×5): 1 via ORAL
  Filled 2018-06-10 (×5): qty 1

## 2018-06-10 MED ORDER — SODIUM CHLORIDE 0.9% IV SOLUTION: Freq: Once | INTRAVENOUS | Status: DC

## 2018-06-10 NOTE — Plan of Care (Signed)

## 2018-06-10 NOTE — Care Management Note (Signed)
Case Management Note  Patient Details  Name: KARLA VINES MRN: [id number] Date of Birth: 07/25/1940  Subjective/Objective:                    Action/Plan:  From home alone, daughter lives close. Has home oxygen . Will continue to monitor for discharge needs. Expected Discharge Date:                  Expected Discharge Plan:     In-House Referral:     Discharge planning Services  CM Consult  Post Acute Care Choice:    Choice offered to:     DME Arranged:    DME Agency:     HH Arranged:    HH Agency:     Status of Service:  In process, will continue to follow  If discussed at Long Length of Stay Meetings, dates discussed:    Additional Comments:  Marilu Favre, RN 06/10/2018, 10:41 AM

## 2018-06-10 NOTE — Progress Notes (Signed)
CRITICAL VALUE ALERT  Critical Value:  INR 6.0  Date & Time Notied:  06/10/2018 at 0330  Provider Notified: Arby Barrette, NP  Orders Received/Actions taken: No orders received at this time. Will continue to monitor.

## 2018-06-10 NOTE — Progress Notes (Addendum)
PROGRESS NOTE    Antonio Wise  NAT:[id number] DOB: 1940-07-15 DOA: 06/09/2018 PCP: [name], PA-C  Brief Narrative:77 y.o. male with medical history significant of O2 dependent COPD, uses 2 L of oxygen 24 hours a day, chronic GI bleed, history of duodenal ulcer, gastric ulcer, diverticulitis, atrial fibrillation on Xarelto, hypertension, hyperlipidemia admitted with progressive worsening of shortness of breath for the last few days prior to admission to the hospital.  Patient lives alone.  He was found on the floor by the family after being on the floor for approximately 4 hours.  He complains of generalized weakness and difficulty breathing upon arrival to the ER.  He was placed on a CPAP and brought him to the hospital in the ER patient was placed on BiPAP.  He denies fever, chills, cough, nausea vomiting diarrhea abdominal pain chest pain or palpitation.  He denies urinary complaints.  He does report decreased appetite he thinks he must have lost weight but no know how much.  Reports decreased p.o. intake at home. Patient has been having chronic GI bleed with maroon stool, bright red bleeding per rectum on and off for almost a year now.  He was supposed to have a colonoscopy by Dr. [name] with [facility].  However it has not happened it was always postponed due to some reason or the other.    ED Course: His vital signs were 114/90, temperature 97.4 pulse rate 116 respiration 21 saturation 93% on 4 L.  He received Solu-Medrol and nebulizer treatments.  His initial lactic acid level was 8.50 which came down to 6.780 without any intervention.  Repeat lactic acid is pending at this time.  His white count was 29.4 hemoglobin 5.8 platelet count 793.  Sodium 143 potassium 4.2 BUN 29 creatinine 2.28 anion gap is 21.  pH was 7.28 PCO2 54 bicarb 20 5 repeat ABG after BiPAP 7.32 CO2 still 54 bicarbonate 28.  He was seen by GI Dr. [name] in the ER.  Showed hazy opacity at the right lung base may be due to  atelectasis could also represent pneumonia or aspiration.  Findings consistent with emphysema noted.   Assessment & Plan:   Active Problems:   COPD with acute exacerbation ([facility])   GI bleed   Leukocytosis   AKI (acute kidney injury) ([place])   Transaminitis   Iron deficiency anemia due to chronic blood loss  #1 COPD exacerbation patient reports improvement.taper steroids.continue nebs  #2 sepsis/SIRS upon admission with profound tachycardia tachypnea and leukocytosis with lactic acidosis.  Blood cultures no growth to date. On cefepime.  #3 AKI secondary to dehydration decreased p.o. intake and diuretics improved with IV hydration.  #4 anemia due to chronic GI blood loss Xarelto on hold GI following await decision. Hb 9.3.s/p 4 unit prbc.  #5 transaminitis improved  #6 elevated INR 2.78  Severity patient at high risk for cardiac and respiratory decompensation due to anemia ongoing GI bleed and COPD exacerbation as well as sepsis.  He is also in AKI with mild improvement with dehydration.   Malnutrition Type:  Nutrition Problem: Inadequate oral intake Etiology: altered GI function, decreased appetite   Malnutrition Characteristics:  Signs/Symptoms: meal completion < 50%, per patient/family report   Nutrition Interventions:  Interventions: Boost Breeze, Refer to RD note for recommendations, MVI  Estimated body mass index is 30.28 kg/m as calculated from the following:   Height as of this encounter: 6\' 1"  (1.854 m).   Weight as of this encounter: 104.1 kg.  DVT prophylaxis:scd Code Status: Full code Family Communication: Discussed with the 2 daughters in the room Disposition Plan: Pending clinical improvement  Consultants: GI  Procedures: None Antimicrobials: Cefepime  Subjective: He has not had any further bright red bleeding or black stools since admission however he is waiting to have a bowel movement this morning.  Objective: Vitals:   06/09/18 1925  06/09/18 2032 06/09/18 2043 06/10/18 0518  BP: (!) 107/95 (!) 147/87  126/77  Pulse: 92 92  (!) 101  Resp: 20 18  16   Temp: 98 F (36.7 C) 98.3 F (36.8 C)  97.7 F (36.5 C)  TempSrc: Oral Oral  Oral  SpO2: 100% 99%  97%  Weight:   104.1 kg   Height:   6\' 1"  (1.854 m)     Intake/Output Summary (Last 24 hours) at 06/10/2018 1259 Last data filed at 06/10/2018 0930 Gross per 24 hour  Intake 3846.96 ml  Output -  Net 3846.96 ml   Filed Weights   06/09/18 1223 06/09/18 2043  Weight: 105 kg 104.1 kg    Examination:  General exam: Appears calm and comfortable  Respiratory system: Scattered rhonchi and wheezing to auscultation. Respiratory effort normal. Cardiovascular system: S1 & S2 heard, RRR. No JVD, murmurs, rubs, gallops or clicks. No pedal edema. Gastrointestinal system: Abdomen is nondistended, soft and nontender. No organomegaly or masses felt. Normal bowel sounds heard. Central nervous system: Alert and oriented. No focal neurological deficits. Extremities: Trace bilateral pitting edema Skin: No rashes, lesions or ulcers Psychiatry: Judgement and insight appear normal. Mood & affect appropriate.     Data Reviewed: I have personally reviewed following labs and imaging studies  CBC: Recent Labs  Lab 06/09/18 1253 06/09/18 1522 06/10/18 0235  WBC 29.4* 19.0* 12.9*  NEUTROABS 25.0* 17.3*  --   HGB 5.8* 4.7* 7.7*  HCT 25.1* 20.4* 28.4*  MCV 78.0* 76.4* 75.5*  PLT 793* 613* 037*   Basic Metabolic Panel: Recent Labs  Lab 06/09/18 1253 06/09/18 1636 06/10/18 0235  NA 143 141 139  K 4.2 3.8 3.6  CL 100 101 99  CO2 22 20* 25  GLUCOSE 153* 202* 215*  BUN 29* 33* 33*  CREATININE 2.28* 2.23* 2.19*  CALCIUM 8.5* 8.1* 8.3*   GFR: Estimated Creatinine Clearance: 36.9 mL/min (A) (by C-G formula based on SCr of 2.19 mg/dL (H)). Liver Function Tests: Recent Labs  Lab 06/09/18 1253 06/09/18 1636 06/10/18 0235  AST 327* 317* 222*  ALT 375* 319* 308*  ALKPHOS  100 84 87  BILITOT 1.8* 1.9* 1.7*  PROT 6.6 5.9* 6.2*  ALBUMIN 3.8 3.3* 3.5   No results for input(s): LIPASE, AMYLASE in the last 168 hours. No results for input(s): AMMONIA in the last 168 hours. Coagulation Profile: Recent Labs  Lab 06/10/18 0235  INR 6.00*   Cardiac Enzymes: No results for input(s): CKTOTAL, CKMB, CKMBINDEX, TROPONINI in the last 168 hours. BNP (last 3 results) Recent Labs    10/08/17 1148  PROBNP 335.0*   HbA1C: No results for input(s): HGBA1C in the last 72 hours. CBG: No results for input(s): GLUCAP in the last 168 hours. Lipid Profile: No results for input(s): CHOL, HDL, LDLCALC, TRIG, CHOLHDL, LDLDIRECT in the last 72 hours. Thyroid Function Tests: No results for input(s): TSH, T4TOTAL, FREET4, T3FREE, THYROIDAB in the last 72 hours. Anemia Panel: Recent Labs    06/09/18 1636  VITAMINB12 572  FOLATE 12.3  FERRITIN 18*  TIBC 468*  IRON 8*  RETICCTPCT 2.1   Sepsis  Labs: Recent Labs  Lab 06/09/18 1214 06/09/18 1448 06/09/18 1636 06/10/18 0752  PROCALCITON  --   --  1.48 1.90  LATICACIDVEN 8.50* 6.78*  --   --     Recent Results (from the past 240 hour(s))  Blood culture (routine x 2)     Status: None (Preliminary result)   Collection Time: 06/09/18  2:55 PM  Result Value Ref Range Status   Specimen Description BLOOD LEFT HAND  Final   Special Requests   Final    BOTTLES DRAWN AEROBIC AND ANAEROBIC Blood Culture adequate volume   Culture   Final    NO GROWTH < 24 HOURS Performed at [facility], [place] [address], [place], [place] [address]    Report Status PENDING  Incomplete  Blood culture (routine x 2)     Status: None (Preliminary result)   Collection Time: 06/09/18  8:51 PM  Result Value Ref Range Status   Specimen Description BLOOD LEFT HAND  Final   Special Requests   Final    BOTTLES DRAWN AEROBIC ONLY Blood Culture adequate volume   Culture   Final    NO GROWTH < 12 HOURS Performed at [facility],  [place] [address] [place], [place] [address]    Report Status PENDING  Incomplete  MRSA PCR Screening     Status: None   Collection Time: 06/09/18 10:11 PM  Result Value Ref Range Status   MRSA by PCR NEGATIVE NEGATIVE Final    Comment:        The GeneXpert MRSA Assay (FDA approved for NASAL specimens only), is one component of a comprehensive MRSA colonization surveillance program. It is not intended to diagnose MRSA infection nor to guide or monitor treatment for MRSA infections. Performed at [facility], [place] [address], [place], [place] [address]          Radiology Studies: Dg Chest Port 1 View  Result Date: 06/09/2018 CLINICAL DATA:  Patient found down today.  History of COPD. EXAM: PORTABLE CHEST 1 VIEW COMPARISON:  PA and lateral chest 10/08/2017. FINDINGS: The lungs are emphysematous. Lung volumes are somewhat lower than on the comparison exams. Hazy airspace opacity is seen in the right lung base. The left lung is clear. No pneumothorax or pleural effusion. Heart size is normal. Aortic atherosclerosis is noted. No acute bony abnormality. IMPRESSION: Hazy opacity in the right lung base may be due to atelectasis in this low volume chest but could also be secondary to pneumonia or aspiration. Emphysema. Atherosclerosis. Electronically Signed   By: Inge Rise M.D.   On: 06/09/2018 12:47        Scheduled Meds: . feeding supplement  1 Container Oral TID BM  . [START ON 06/11/2018] Influenza vac split quadrivalent PF  0.5 mL Intramuscular Tomorrow-1000  . methylPREDNISolone (SOLU-MEDROL) injection  60 mg Intravenous Q6H  . multivitamin with minerals  1 tablet Oral Daily  . pantoprazole  40 mg Oral BID   Continuous Infusions: . sodium chloride 75 mL/hr at 06/10/18 1044  . ceFEPime (MAXIPIME) IV Stopped (06/10/18 0746)  . [START ON 06/11/2018] vancomycin       LOS: 1 day     Georgette Shell, MD [facility]  If 7PM-7AM, please contact  night-coverage [email or web address] Password [email or web address] 06/10/2018, 12:59 PM

## 2018-06-10 NOTE — Progress Notes (Signed)
Pt. arrived to the unit.  VSS and pt. placed on 4L oxygen via nasal cannula.  Pt. oriented to room.  Will continue to monitor.

## 2018-06-10 NOTE — Progress Notes (Signed)
Initial Nutrition Assessment  DOCUMENTATION CODES:   Obesity unspecified  INTERVENTION:   -Boost Breeze po TID, each supplement provides 250 kcal and 9 grams of protein -MVI with minerals daily -RD will follow for diet advancement and adjust supplement regimen as appropriate  NUTRITION DIAGNOSIS:   Inadequate oral intake related to altered GI function, decreased appetite as evidenced by meal completion < 50%, per patient/family report.  GOAL:   Patient will meet greater than or equal to 90% of their needs  MONITOR:   PO intake, Supplement acceptance, Diet advancement, Labs, Weight trends, Skin, I & O's  REASON FOR ASSESSMENT:   Malnutrition Screening Tool    ASSESSMENT:   Antonio Wise is a 77 y.o. male with medical history significant of O2 dependent COPD, uses 2 L of oxygen 24 hours a day, chronic GI bleed, history of duodenal ulcer, gastric ulcer, diverticulitis, atrial fibrillation on Xarelto, hypertension, hyperlipidemia admitted with progressive worsening of shortness of breath for the last few days prior to admission to the hospital  Pt admitted with GIB.   Spoke with pt and two daughters at bedside. All confirm that pt has experienced a general decline in health over the past 6-12 months, which initially started with decreased mobility and energy level ("I now pretty much sit around and watch TV all day"). Pt shares that up until a month ago, his appetite was still good- he would consume 3 meals per day and consume all of the food on his plate. However, over the past 3 weeks, pt shares that he only consumed bites and sips at meals. Up until recently, pt reports he would consume about 1 Boost supplement daily, however, "I got lazy doing that too".   Pt unsure if he has lost weight, however, suspects he has secondary to decreased appetite. Per pt daughters, pt does not monitor his weight regularly- the last time he weighed was about 6 months ago at his pulmonologist's  office. Reviewed wt hx, which reveals wt stability over the past 9 months.   Pt reports tolerating clear liquid diet well without difficulty. He reports he consumed all of his breakfast. Noted meal completion 35% of dinner. Pt amenable to supplements. Discussed importance of good meal and supplement intake to promote healing.   Labs reviewed.   NUTRITION - FOCUSED PHYSICAL EXAM:    Most Recent Value  Orbital Region  Mild depletion  Upper Arm Region  No depletion  Thoracic and Lumbar Region  No depletion  Buccal Region  No depletion  Temple Region  Mild depletion  Clavicle Bone Region  No depletion  Clavicle and Acromion Bone Region  No depletion  Scapular Bone Region  No depletion  Dorsal Hand  No depletion  Patellar Region  No depletion  Anterior Thigh Region  No depletion  Posterior Calf Region  No depletion  Edema (RD Assessment)  Mild  Hair  Reviewed  Eyes  Reviewed  Mouth  Reviewed  Skin  Reviewed  Nails  Reviewed       Diet Order:   Diet Order            Diet clear liquid Room service appropriate? Yes; Fluid consistency: Thin  Diet effective now              EDUCATION NEEDS:   Education needs have been addressed  Skin:  Skin Assessment: Reviewed RN Assessment  Last BM:  06/09/18  Height:   Ht Readings from Last 1 Encounters:  06/09/18 6\' 1"  (1.854  m)    Weight:   Wt Readings from Last 1 Encounters:  06/09/18 104.1 kg    Ideal Body Weight:  83.6 kg  BMI:  Body mass index is 30.28 kg/m.  Estimated Nutritional Needs:   Kcal:  1800-2000  Protein:  100-115 grams  Fluid:  1.8-2.0 L    Thurma Priego A. Jimmye Norman, RD, LDN, CDE Pager: [phone] After hours Pager: [phone]

## 2018-06-11 ENCOUNTER — Inpatient Hospital Stay (HOSPITAL_COMMUNITY): Payer: PPO

## 2018-06-11 LAB — TYPE AND SCREEN
ABO/RH(D): A POS
Antibody Screen: NEGATIVE
Unit division: 0
Unit division: 0
Unit division: 0

## 2018-06-11 LAB — COMPREHENSIVE METABOLIC PANEL
ALT: 229 U/L — ABNORMAL HIGH (ref 0–44)
AST: 70 U/L — ABNORMAL HIGH (ref 15–41)
Albumin: 3.3 g/dL — ABNORMAL LOW (ref 3.5–5.0)
Alkaline Phosphatase: 76 U/L (ref 38–126)
Anion gap: 11 (ref 5–15)
BUN: 26 mg/dL — ABNORMAL HIGH (ref 8–23)
CHLORIDE: 100 mmol/L (ref 98–111)
CO2: 26 mmol/L (ref 22–32)
Calcium: 8.6 mg/dL — ABNORMAL LOW (ref 8.9–10.3)
Creatinine, Ser: 1.63 mg/dL — ABNORMAL HIGH (ref 0.61–1.24)
GFR calc Af Amer: 47 mL/min — ABNORMAL LOW (ref >60)
GFR calc non Af Amer: 41 mL/min — ABNORMAL LOW (ref >60)
GLUCOSE: 245 mg/dL — AB (ref 70–99)
Potassium: 4 mmol/L (ref 3.5–5.1)
SODIUM: 137 mmol/L (ref 135–145)
Total Bilirubin: 1.5 mg/dL — ABNORMAL HIGH (ref 0.3–1.2)
Total Protein: 5.7 g/dL — ABNORMAL LOW (ref 6.5–8.1)

## 2018-06-11 LAB — BPAM RBC
Blood Product Expiration Date: 201912132359
Blood Product Expiration Date: 201912302359
Blood Product Expiration Date: 201912302359
ISSUE DATE / TIME: 201912051725
ISSUE DATE / TIME: 201912051544
ISSUE DATE / TIME: 201912061354
Unit Type and Rh: 600
Unit Type and Rh: 6200
Unit Type and Rh: 6200

## 2018-06-11 LAB — CBC
HCT: 33 % — ABNORMAL LOW (ref 39.0–52.0)
Hemoglobin: 9.3 g/dL — ABNORMAL LOW (ref 13.0–17.0)
MCH: 21.8 pg — ABNORMAL LOW (ref 26.0–34.0)
MCHC: 28.2 g/dL — ABNORMAL LOW (ref 30.0–36.0)
MCV: 77.3 fL — ABNORMAL LOW (ref 80.0–100.0)
Platelets: 397 10*3/uL (ref 150–400)
RBC: 4.27 MIL/uL (ref 4.22–5.81)
RDW: 18 % — ABNORMAL HIGH (ref 11.5–15.5)
WBC: 18.1 10*3/uL — ABNORMAL HIGH (ref 4.0–10.5)
nRBC: 1.5 % — ABNORMAL HIGH (ref 0.0–0.2)

## 2018-06-11 LAB — PROTIME-INR
INR: 2.78
Prothrombin Time: 28.9 seconds — ABNORMAL HIGH (ref 11.4–15.2)

## 2018-06-11 LAB — PROCALCITONIN: Procalcitonin: 1.33 ng/mL

## 2018-06-11 MED ORDER — ONDANSETRON HCL 4 MG/2ML IJ SOLN
4.0000 mg | Freq: Four times a day (QID) | INTRAMUSCULAR | Status: DC | PRN
  Administered 2018-06-11: 4 mg via INTRAVENOUS
  Filled 2018-06-11: qty 2

## 2018-06-11 MED ORDER — LEVALBUTEROL HCL 0.63 MG/3ML IN NEBU
0.6300 mg | INHALATION_SOLUTION | Freq: Three times a day (TID) | RESPIRATORY_TRACT | Status: DC | PRN
  Administered 2018-06-16: 0.63 mg via RESPIRATORY_TRACT
  Filled 2018-06-11: qty 3

## 2018-06-11 NOTE — Progress Notes (Signed)
[facility] PROGRESS NOTE Subjective Patient without further gross bleeding although he says his stools are dark.  He is now been off of the Xarelto for 3 days.  He met criteria for sepsis on admission with leukocytosis and lactic acidosis and this is improved with antibiotic therapy blood cultures are negative so far etiology unclear.  His AKI is gotten much better with hydration.  He has had 3 units of blood hemoglobin is up to 9.  He is on Protonix.  Objective: Vital signs in last 24 hours: Temp:  [97.6 F (36.4 C)-98.2 F (36.8 C)] 97.6 F (36.4 C) (12/07 1345) Pulse Rate:  [96-149] 149 (12/07 1345) Resp:  [16-22] 22 (12/07 1345) BP: (131-186)/(64-87) 186/87 (12/07 1345) SpO2:  [89 %-100 %] 89 % (12/07 1345) Last BM Date: 06/11/18  Intake/Output from previous day: 12/06 0701 - 12/07 0700 In: 2461.3 [P.O.:600; I.V.:1398.1; Blood:300; IV Piggyback:163.2] Out: 1050 [Urine:1050] Intake/Output this shift: No intake/output data recorded.  PE: General--patient eating supper -  Lab Results: Recent Labs    06/09/18 1253 06/09/18 1522 06/10/18 0235 06/11/18 0307  WBC 29.4* 19.0* 12.9* 18.1*  HGB 5.8* 4.7* 7.7* 9.3*  HCT 25.1* 20.4* 28.4* 33.0*  PLT 793* 613* 412* 397   BMET Recent Labs    06/09/18 1253 06/09/18 1636 06/10/18 0235 06/11/18 0307  NA 143 141 139 137  K 4.2 3.8 3.6 4.0  CL 100 101 99 100  CO2 22 20* 25 26  CREATININE 2.28* 2.23* 2.19* 1.63*   LFT Recent Labs    06/09/18 1636 06/10/18 0235 06/11/18 0307  PROT 5.9* 6.2* 5.7*  AST 317* 222* 70*  ALT 319* 308* 229*  ALKPHOS 84 87 76  BILITOT 1.9* 1.7* 1.5*   PT/INR Recent Labs    06/10/18 0235 06/11/18 0746  LABPROT 52.5* 28.9*  INR 6.00* 2.78   PANCREAS No results for input(s): LIPASE in the last 72 hours.       Studies/Results: Dg Abd 1 View  Result Date: 06/11/2018 CLINICAL DATA:  Constipation. EXAM: ABDOMEN - 1 VIEW COMPARISON:  CT scan 10/22/2015 FINDINGS: No  gaseous bowel dilatation to suggest obstruction. No substantial stool volume in the colon. Calcified gallstones evident right upper quadrant. Visualized bony anatomy unremarkable. IMPRESSION: Nonspecific bowel gas pattern. No substantial stool volume in the colon. Electronically Signed   By: Misty Stanley M.D.   On: 06/11/2018 16:15    Medications: I have reviewed the patient's current medications.  Assessment:   1.  GI bleed.  Iron is profoundly low.  The patient has had previous history of ulcer disease and is been positive in the past for H. pylori.  He also has had polyps removed and was due to have another colonoscopy 1/19 by Dr. [name] this was scheduled several times but due to medical problems had to be canceled.  The etiology of his blood loss is unclear but he has dropped significantly over the past 6 months.  I think it is reasonable since he is doing so much better now to go ahead with an upper endoscopy and then we can decide about colonoscopy depending on those results. 2.  Severe COPD on chronic home O2 3.  PAF on chronic anticoagulation   Plan: 1.  We will plan on EGD tomorrow approximately 1130 with propofol sedation.  Have discussed this with the patient and his wife.  Depending on these results we will have to decide whether or not a colonoscopy would be appropriate.  Given his severe COPD  if colonoscopy is to be performed he will need to be done in the hospital.  This is all been discussed in detail with the patient and his wife.   Nancy Fetter 06/11/2018, 7:14 PM  This note was created using voice recognition software. Minor errors may Have occurred unintentionally.  Pager: [phone] If no answer or after hours call [phone]

## 2018-06-11 NOTE — Progress Notes (Signed)
PROGRESS NOTE    ARKEEM HARTS  WUJ:[id number] DOB: 1940-08-31 DOA: 06/09/2018 PCP: [name], PA-C  Brief Narrative:77 y.o.malewith medical history significant ofO2 dependent COPD, uses 2 L of oxygen 24 hours a day, chronic GI bleed, history of duodenal ulcer, gastric ulcer, diverticulitis, atrial fibrillation on Xarelto, hypertension, hyperlipidemia admitted with progressive worsening of shortness of breath for the last few days prior to admission to the hospital. Patient lives alone. He was found on the floor by the family after being on the floor for approximately 4 hours. He complains of generalized weakness and difficulty breathing upon arrival to the ER. He was placed on a CPAP and brought him to the hospital in the ER patient was placed on BiPAP. He denies fever, chills, cough, nausea vomiting diarrhea abdominal pain chest pain or palpitation. He denies urinary complaints. He does report decreased appetite he thinks he must have lost weight but no know how much. Reports decreased p.o. intake at home. Patient has been having chronic GI bleed with maroon stool, bright red bleeding per rectum on and off for almost a year now. He was supposed to have a colonoscopy by Dr. [name] with [facility]. However it has not happened it was always postponed due to some reason or the other.    ED Course:His vital signs were 114/90,temperature 97.4 pulse rate 116 respiration 21 saturation 93% on 4 L. He received Solu-Medrol and nebulizer treatments. His initial lactic acid level was 8.50 which came down to 6.780 without any intervention. Repeat lactic acid is pending at this time. His white count was 29.4 hemoglobin 5.8 platelet count 793. Sodium 143 potassium 4.2 BUN 29 creatinine 2.28 anion gap is 21. pH was 7.28 PCO2 54 bicarb 20 5 repeat ABG after BiPAP 7.32 CO2 still 54 bicarbonate 28. He was seen by GI Dr. [name] in the ER. Showed hazy opacity at the right lung base may be due to  atelectasis could also represent pneumonia or aspiration. Findings consistent with emphysema noted.   Assessment & Plan:   Active Problems:   COPD with acute exacerbation (HCC)   GI bleed   Leukocytosis   AKI (acute kidney injury) ([place])   Transaminitis   Iron deficiency anemia due to chronic blood loss  #1 COPD exacerbation taper solumedrol continue nebs  #2 sepsis/SIRS upon admission with profound tachycardia tachypnea and leukocytosis with lactic acidosis.  This is improved with vancomycin and cefepime.  Blood cultures no growth to date.  Since MRSA PCR is negative will DC vancomycin.  Continue cefepime.  Source unknown still.  #3 AKI secondary to dehydration decreased p.o. intake and diuretics improved with IV hydration.  #4 anemia due to chronic GI blood loss Xarelto on hold GI following await decision.  He is status post 3 units of blood transfusion.  Hemoglobin today9.   #5 transaminitis improved  #6 elevated INR improved  Severity patient at high risk for cardiac and respiratory decompensation due to anemia ongoing GI bleed and COPD exacerbation as well as sepsis.  He is also in AKI with mild improvement with dehydration.    Malnutrition Type:  Nutrition Problem: Inadequate oral intake Etiology: altered GI function, decreased appetite   Malnutrition Characteristics:  Signs/Symptoms: meal completion < 50%, per patient/family report   Nutrition Interventions:  Interventions: Boost Breeze, Refer to RD note for recommendations, MVI  Estimated body mass index is 30.28 kg/m as calculated from the following:   Height as of this encounter: 6\' 1"  (1.854 m).  Weight as of this encounter: 104.1 kg.  DVT prophylaxis:  Code Status: Family Communication: Disposition Plan:    Consultants:   Procedures:  Antimicrobials:  Subjective:   Objective: Vitals:   06/10/18 1748 06/10/18 2042 06/11/18 0522 06/11/18 1345  BP: 126/76 (!) 143/81 131/64 (!) 186/87   Pulse: 93 96 96 (!) 149  Resp: 18 16 16  (!) 22  Temp: (!) 97.4 F (36.3 C) 97.8 F (36.6 C) 98.2 F (36.8 C) 97.6 F (36.4 C)  TempSrc: Oral Oral Oral Oral  SpO2: 100% 96% 100% (!) 89%  Weight:      Height:        Intake/Output Summary (Last 24 hours) at 06/11/2018 1400 Last data filed at 06/11/2018 0607 Gross per 24 hour  Intake 2101.27 ml  Output 1050 ml  Net 1051.27 ml   Filed Weights   06/09/18 1223 06/09/18 2043  Weight: 105 kg 104.1 kg    Examination:  General exam: Appears calm and comfortable  Respiratory system: Clear to auscultation. Respiratory effort normal. Cardiovascular system: S1 & S2 heard, RRR. No JVD, murmurs, rubs, gallops or clicks. No pedal edema. Gastrointestinal system: Abdomen is nondistended, soft and nontender. No organomegaly or masses felt. Normal bowel sounds heard. Central nervous system: Alert and oriented. No focal neurological deficits. Extremities: Symmetric 5 x 5 power. Skin: No rashes, lesions or ulcers Psychiatry: Judgement and insight appear normal. Mood & affect appropriate.     Data Reviewed: I have personally reviewed following labs and imaging studies  CBC: Recent Labs  Lab 06/09/18 1253 06/09/18 1522 06/10/18 0235 06/11/18 0307  WBC 29.4* 19.0* 12.9* 18.1*  NEUTROABS 25.0* 17.3*  --   --   HGB 5.8* 4.7* 7.7* 9.3*  HCT 25.1* 20.4* 28.4* 33.0*  MCV 78.0* 76.4* 75.5* 77.3*  PLT 793* 613* 412* 563   Basic Metabolic Panel: Recent Labs  Lab 06/09/18 1253 06/09/18 1636 06/10/18 0235 06/11/18 0307  NA 143 141 139 137  K 4.2 3.8 3.6 4.0  CL 100 101 99 100  CO2 22 20* 25 26  GLUCOSE 153* 202* 215* 245*  BUN 29* 33* 33* 26*  CREATININE 2.28* 2.23* 2.19* 1.63*  CALCIUM 8.5* 8.1* 8.3* 8.6*   GFR: Estimated Creatinine Clearance: 49.6 mL/min (A) (by C-G formula based on SCr of 1.63 mg/dL (H)). Liver Function Tests: Recent Labs  Lab 06/09/18 1253 06/09/18 1636 06/10/18 0235 06/11/18 0307  AST 327* 317* 222* 70*   ALT 375* 319* 308* 229*  ALKPHOS 100 84 87 76  BILITOT 1.8* 1.9* 1.7* 1.5*  PROT 6.6 5.9* 6.2* 5.7*  ALBUMIN 3.8 3.3* 3.5 3.3*   No results for input(s): LIPASE, AMYLASE in the last 168 hours. No results for input(s): AMMONIA in the last 168 hours. Coagulation Profile: Recent Labs  Lab 06/10/18 0235 06/11/18 0746  INR 6.00* 2.78   Cardiac Enzymes: No results for input(s): CKTOTAL, CKMB, CKMBINDEX, TROPONINI in the last 168 hours. BNP (last 3 results) Recent Labs    10/08/17 1148  PROBNP 335.0*   HbA1C: No results for input(s): HGBA1C in the last 72 hours. CBG: No results for input(s): GLUCAP in the last 168 hours. Lipid Profile: No results for input(s): CHOL, HDL, LDLCALC, TRIG, CHOLHDL, LDLDIRECT in the last 72 hours. Thyroid Function Tests: No results for input(s): TSH, T4TOTAL, FREET4, T3FREE, THYROIDAB in the last 72 hours. Anemia Panel: Recent Labs    06/09/18 1636  VITAMINB12 572  FOLATE 12.3  FERRITIN 18*  TIBC 468*  IRON 8*  RETICCTPCT 2.1   Sepsis Labs: Recent Labs  Lab 06/09/18 1214 06/09/18 1448 06/09/18 1636 06/10/18 0752 06/11/18 0307  PROCALCITON  --   --  1.48 1.90 1.33  LATICACIDVEN 8.50* 6.78*  --   --   --     Recent Results (from the past 240 hour(s))  Blood culture (routine x 2)     Status: None (Preliminary result)   Collection Time: 06/09/18  2:55 PM  Result Value Ref Range Status   Specimen Description BLOOD LEFT HAND  Final   Special Requests   Final    BOTTLES DRAWN AEROBIC AND ANAEROBIC Blood Culture adequate volume   Culture   Final    NO GROWTH < 24 HOURS Performed at [facility], [place] [address] [place], [place] [address]    Report Status PENDING  Incomplete  Blood culture (routine x 2)     Status: None (Preliminary result)   Collection Time: 06/09/18  8:51 PM  Result Value Ref Range Status   Specimen Description BLOOD LEFT HAND  Final   Special Requests   Final    BOTTLES DRAWN AEROBIC ONLY Blood Culture  adequate volume   Culture   Final    NO GROWTH < 24 HOURS Performed at [facility], [place] [address], [place], [place] [address]    Report Status PENDING  Incomplete  MRSA PCR Screening     Status: None   Collection Time: 06/09/18 10:11 PM  Result Value Ref Range Status   MRSA by PCR NEGATIVE NEGATIVE Final    Comment:        The GeneXpert MRSA Assay (FDA approved for NASAL specimens only), is one component of a comprehensive MRSA colonization surveillance program. It is not intended to diagnose MRSA infection nor to guide or monitor treatment for MRSA infections. Performed at [facility], [place] [address], [place], [place] [address]          Radiology Studies: No results found.      Scheduled Meds: . sodium chloride   Intravenous Once  . feeding supplement  1 Container Oral TID BM  . methylPREDNISolone (SOLU-MEDROL) injection  60 mg Intravenous Q6H  . multivitamin with minerals  1 tablet Oral Daily  . pantoprazole  40 mg Oral BID   Continuous Infusions: . sodium chloride 75 mL/hr at 06/11/18 0708  . ceFEPime (MAXIPIME) IV Stopped (06/10/18 2101)     LOS: 2 days      Georgette Shell, MD [facility]  If 7PM-7AM, please contact night-coverage [email or web address] Password [email or web address] 06/11/2018, 2:00 PM

## 2018-06-12 ENCOUNTER — Encounter (HOSPITAL_COMMUNITY): Payer: Self-pay

## 2018-06-12 ENCOUNTER — Inpatient Hospital Stay (HOSPITAL_COMMUNITY): Payer: PPO | Admitting: Certified Registered Nurse Anesthetist

## 2018-06-12 ENCOUNTER — Encounter (HOSPITAL_COMMUNITY)
Admission: EM | Disposition: A | Discharge status: Home Health | Payer: Self-pay | Source: Home / Self Care | Attending: Internal Medicine

## 2018-06-12 HISTORY — PX: ESOPHAGOGASTRODUODENOSCOPY (EGD) WITH PROPOFOL: SHX5813

## 2018-06-12 LAB — CBC
HEMATOCRIT: 33 % — AB (ref 39.0–52.0)
Hemoglobin: 9.2 g/dL — ABNORMAL LOW (ref 13.0–17.0)
MCH: 22.5 pg — ABNORMAL LOW (ref 26.0–34.0)
MCHC: 27.9 g/dL — ABNORMAL LOW (ref 30.0–36.0)
MCV: 80.7 fL (ref 80.0–100.0)
Platelets: 396 10*3/uL (ref 150–400)
RBC: 4.09 MIL/uL — ABNORMAL LOW (ref 4.22–5.81)
RDW: 19 % — AB (ref 11.5–15.5)
WBC: 19.9 10*3/uL — ABNORMAL HIGH (ref 4.0–10.5)
nRBC: 2.4 % — ABNORMAL HIGH (ref 0.0–0.2)

## 2018-06-12 LAB — COMPREHENSIVE METABOLIC PANEL
ALT: 175 U/L — ABNORMAL HIGH (ref 0–44)
AST: 44 U/L — ABNORMAL HIGH (ref 15–41)
Albumin: 3 g/dL — ABNORMAL LOW (ref 3.5–5.0)
Alkaline Phosphatase: 67 U/L (ref 38–126)
Anion gap: 10 (ref 5–15)
BILIRUBIN TOTAL: 1.2 mg/dL (ref 0.3–1.2)
BUN: 28 mg/dL — ABNORMAL HIGH (ref 8–23)
CO2: 25 mmol/L (ref 22–32)
Calcium: 8.2 mg/dL — ABNORMAL LOW (ref 8.9–10.3)
Chloride: 103 mmol/L (ref 98–111)
Creatinine, Ser: 1.76 mg/dL — ABNORMAL HIGH (ref 0.61–1.24)
GFR calc Af Amer: 43 mL/min — ABNORMAL LOW (ref >60)
GFR calc non Af Amer: 37 mL/min — ABNORMAL LOW (ref >60)
Glucose, Bld: 295 mg/dL — ABNORMAL HIGH (ref 70–99)
Potassium: 3.7 mmol/L (ref 3.5–5.1)
Sodium: 138 mmol/L (ref 135–145)
Total Protein: 5.4 g/dL — ABNORMAL LOW (ref 6.5–8.1)

## 2018-06-12 SURGERY — ESOPHAGOGASTRODUODENOSCOPY (EGD) WITH PROPOFOL
Anesthesia: Monitor Anesthesia Care

## 2018-06-12 MED ORDER — PEG 3350-KCL-NA BICARB-NACL 420 G PO SOLR
4000.0000 mL | Freq: Once | ORAL | Status: AC
  Administered 2018-06-12: 4000 mL via ORAL
  Filled 2018-06-12 (×2): qty 4000

## 2018-06-12 MED ORDER — PROPOFOL 10 MG/ML IV BOLUS
INTRAVENOUS | Status: DC | PRN
  Administered 2018-06-12: 20 mg via INTRAVENOUS

## 2018-06-12 MED ORDER — LACTATED RINGERS IV SOLN
INTRAVENOUS | Status: DC
  Administered 2018-06-12: 1000 mL via INTRAVENOUS

## 2018-06-12 MED ORDER — ALBUTEROL SULFATE (2.5 MG/3ML) 0.083% IN NEBU
2.5000 mg | INHALATION_SOLUTION | Freq: Four times a day (QID) | RESPIRATORY_TRACT | Status: DC
  Administered 2018-06-12 (×2): 2.5 mg via RESPIRATORY_TRACT
  Filled 2018-06-12: qty 3

## 2018-06-12 MED ORDER — ALBUTEROL SULFATE (2.5 MG/3ML) 0.083% IN NEBU
2.5000 mg | INHALATION_SOLUTION | Freq: Three times a day (TID) | RESPIRATORY_TRACT | Status: DC
  Administered 2018-06-13: 2.5 mg via RESPIRATORY_TRACT
  Filled 2018-06-12: qty 3

## 2018-06-12 MED ORDER — SODIUM CHLORIDE 0.9 % IV SOLN: INTRAVENOUS | Status: DC

## 2018-06-12 MED ORDER — ALBUTEROL SULFATE HFA 108 (90 BASE) MCG/ACT IN AERS
INHALATION_SPRAY | RESPIRATORY_TRACT | Status: DC | PRN
  Administered 2018-06-12: 4 via RESPIRATORY_TRACT

## 2018-06-12 MED ORDER — PROPOFOL 500 MG/50ML IV EMUL
INTRAVENOUS | Status: DC | PRN
  Administered 2018-06-12: 100 ug/kg/min via INTRAVENOUS

## 2018-06-12 MED ORDER — ALBUTEROL SULFATE (2.5 MG/3ML) 0.083% IN NEBU
INHALATION_SOLUTION | RESPIRATORY_TRACT | Status: AC
  Administered 2018-06-12: 2.5 mg via RESPIRATORY_TRACT
  Filled 2018-06-12: qty 3

## 2018-06-12 SURGICAL SUPPLY — 15 items

## 2018-06-12 NOTE — Anesthesia Procedure Notes (Signed)
Procedure Name: MAC Performed by: Valda Favia, CRNA Pre-anesthesia Checklist: Patient identified, Emergency Drugs available, Patient being monitored, Suction available and Timeout performed Patient Re-evaluated:Patient Re-evaluated prior to induction Oxygen Delivery Method: Nasal cannula Preoxygenation: Pre-oxygenation with 100% oxygen Induction Type: IV induction Airway Equipment and Method: Bite block Placement Confirmation: positive ETCO2 Dental Injury: Teeth and Oropharynx as per pre-operative assessment

## 2018-06-12 NOTE — Anesthesia Preprocedure Evaluation (Signed)
Anesthesia Evaluation  Patient identified by MRN, date of birth, ID band Patient awake    Reviewed: Allergy & Precautions, NPO status , Patient's Chart, lab work & pertinent test results  Airway Mallampati: I  TM Distance: >3 FB Neck ROM: Full    Dental   Pulmonary COPD, former smoker,    Pulmonary exam normal        Cardiovascular hypertension, Pt. on medications + Peripheral Vascular Disease  Normal cardiovascular exam+ dysrhythmias Atrial Fibrillation      Neuro/Psych    GI/Hepatic   Endo/Other    Renal/GU      Musculoskeletal   Abdominal   Peds  Hematology   Anesthesia Other Findings   Reproductive/Obstetrics                             Anesthesia Physical Anesthesia Plan  ASA: III  Anesthesia Plan: MAC   Post-op Pain Management:    Induction: Intravenous  PONV Risk Score and Plan: 1 and Treatment may vary due to age or medical condition  Airway Management Planned: Simple Face Mask  Additional Equipment:   Intra-op Plan:   Post-operative Plan:   Informed Consent: I have reviewed the patients History and Physical, chart, labs and discussed the procedure including the risks, benefits and alternatives for the proposed anesthesia with the patient or authorized representative who has indicated his/her understanding and acceptance.     Plan Discussed with: CRNA and Surgeon  Anesthesia Plan Comments:         Anesthesia Quick Evaluation

## 2018-06-12 NOTE — Progress Notes (Signed)
PROGRESS NOTE    Antonio Wise  GYI:[id number] DOB: [birth year] DOA: 06/09/2018 PCP: Antonio Sheldon, PA-C  Brief Narrative:77 y.o.malewith medical history significant ofO2 dependent COPD, uses 2 L of oxygen 24 hours a day, chronic GI bleed, history of duodenal ulcer, gastric ulcer, diverticulitis, atrial fibrillation on Xarelto, hypertension, hyperlipidemia admitted with progressive worsening of shortness of breath for the last few days prior to admission to the hospital. Patient lives alone. He was found on the floor by the family after being on the floor for approximately 4 hours. He complains of generalized weakness and difficulty breathing upon arrival to the ER. He was placed on a CPAP and brought him to the hospital in the ER patient was placed on BiPAP. He denies fever, chills, cough, nausea vomiting diarrhea abdominal pain chest pain or palpitation. He denies urinary complaints. He does report decreased appetite he thinks he must have lost weight but no know how much. Reports decreased p.o. intake at home. Patient has been having chronic GI bleed with maroon stool, bright red bleeding per rectum on and off for almost a year now. He was supposed to have a colonoscopy by Dr. [name] with [facility]. However it has not happened it was always postponed due to some reason or the other.    ED Course:His vital signs were 114/90,temperature 97.4 pulse rate 116 respiration 21 saturation 93% on 4 L. He received Solu-Medrol and nebulizer treatments. His initial lactic acid level was 8.50 which came down to 6.780 without any intervention. Repeat lactic acid is pending at this time. His white count was 29.4 hemoglobin 5.8 platelet count 793. Sodium 143 potassium 4.2 BUN 29 creatinine 2.28 anion gap is 21. pH was 7.28 PCO2 54 bicarb 20 5 repeat ABG after BiPAP 7.32 CO2 still 54 bicarbonate 28. He was seen by GI Dr. [name] in the ER. Showed hazy opacity at the right lung base may be due to  atelectasis could also represent pneumonia or aspiration. Findings consistent with emphysema noted  Assessment & Plan:   Active Problems:   COPD with acute exacerbation (HCC)   GI bleed   Leukocytosis   AKI (acute kidney injury) ([place])   Transaminitis   Iron deficiency anemia due to chronic blood loss  #1 COPD exacerbation taper solumedrol continue nebs  #2 sepsis/SIRS upon admission with profound tachycardia tachypnea and leukocytosis with lactic acidosis. This is improved with vancomycin and cefepime. Blood cultures no growth to date. Since MRSA PCR is negative will DC vancomycin. Continue cefepime. Source unknown still.  #3 AKI secondary to dehydration decreased p.o. intake and diuretics improved with IV hydration.  #4 anemia due to chronic GI blood loss Xarelto on hold GI following await decision. He is status post 3 units of blood transfusion. Hemoglobin today9. 2.  Status post EGD with hiatal hernia no evidence of bleeding or ulcer noted plan is for colonoscopy tomorrow.  #5 transaminitis improved  #6 elevated INR improved  Severity patient at high risk for cardiac and respiratory decompensation due to anemia ongoing GI bleed and COPD exacerbation as well as sepsis. He is also in AKI with mild improvement with dehydration.     Malnutrition Type:  Nutrition Problem: Inadequate oral intake Etiology: altered GI function, decreased appetite   Malnutrition Characteristics:  Signs/Symptoms: meal completion < 50%, per patient/family report   Nutrition Interventions:  Interventions: Boost Breeze, Refer to RD note for recommendations, MVI  Estimated body mass index is 30.28 kg/m as calculated from the following:  Height as of this encounter: 6\' 1"  (1.854 m).   Weight as of this encounter: 104.1 kg.  DVT prophylaxis: SCD Code Status: Full code Family Communication: Discussed with daughter Disposition Plan: Pending clinical improvement plan for  colonoscopy tomorrow for ongoing GI bleed Consultants: GI   Procedures: EGD 06/12/2018 Antimicrobials: None  Subjective: Resting in bed in no acute distress breathing is better had some bleeding overnight but decreasing  Objective: Vitals:   06/12/18 1136 06/12/18 1220 06/12/18 1230 06/12/18 1246  BP: (!) 161/84 (!) 146/92 (!) 143/77 (!) 144/87  Pulse: (!) 135 (!) 136 (!) 140 (!) 119  Resp: 20 (!) 24 18 20   Temp: 98.1 F (36.7 C) 97.8 F (36.6 C)  97.6 F (36.4 C)  TempSrc: Oral Oral  Oral  SpO2: 93% 98% 98% 96%  Weight:      Height:        Intake/Output Summary (Last 24 hours) at 06/12/2018 1502 Last data filed at 06/12/2018 1208 Gross per 24 hour  Intake 1156.93 ml  Output 680 ml  Net 476.93 ml   Filed Weights   06/09/18 1223 06/09/18 2043  Weight: 105 kg 104.1 kg    Examination:  General exam: Appears calm and comfortable  Respiratory system: wheezing b/lto auscultation. Respiratory effort normal. Cardiovascular system: S1 & S2 heard, RRR. No JVD, murmurs, rubs, gallops or clicks. No pedal edema. Gastrointestinal system: Abdomen is nondistended, soft and nontender. No organomegaly or masses felt. Normal bowel sounds heard. Central nervous system: Alert and oriented. No focal neurological deficits. Extremities: Symmetric 5 x 5 power. Skin: No rashes, lesions or ulcers Psychiatry: Judgement and insight appear normal. Mood & affect appropriate.     Data Reviewed: I have personally reviewed following labs and imaging studies  CBC: Recent Labs  Lab 06/09/18 1253 06/09/18 1522 06/10/18 0235 06/11/18 0307 06/12/18 0321  WBC 29.4* 19.0* 12.9* 18.1* 19.9*  NEUTROABS 25.0* 17.3*  --   --   --   HGB 5.8* 4.7* 7.7* 9.3* 9.2*  HCT 25.1* 20.4* 28.4* 33.0* 33.0*  MCV 78.0* 76.4* 75.5* 77.3* 80.7  PLT 793* 613* 412* 397 229   Basic Metabolic Panel: Recent Labs  Lab 06/09/18 1253 06/09/18 1636 06/10/18 0235 06/11/18 0307 06/12/18 0321  NA 143 141 139 137 138    K 4.2 3.8 3.6 4.0 3.7  CL 100 101 99 100 103  CO2 22 20* 25 26 25   GLUCOSE 153* 202* 215* 245* 295*  BUN 29* 33* 33* 26* 28*  CREATININE 2.28* 2.23* 2.19* 1.63* 1.76*  CALCIUM 8.5* 8.1* 8.3* 8.6* 8.2*   GFR: Estimated Creatinine Clearance: 46 mL/min (A) (by C-G formula based on SCr of 1.76 mg/dL (H)). Liver Function Tests: Recent Labs  Lab 06/09/18 1253 06/09/18 1636 06/10/18 0235 06/11/18 0307 06/12/18 0321  AST 327* 317* 222* 70* 44*  ALT 375* 319* 308* 229* 175*  ALKPHOS 100 84 87 76 67  BILITOT 1.8* 1.9* 1.7* 1.5* 1.2  PROT 6.6 5.9* 6.2* 5.7* 5.4*  ALBUMIN 3.8 3.3* 3.5 3.3* 3.0*   No results for input(s): LIPASE, AMYLASE in the last 168 hours. No results for input(s): AMMONIA in the last 168 hours. Coagulation Profile: Recent Labs  Lab 06/10/18 0235 06/11/18 0746  INR 6.00* 2.78   Cardiac Enzymes: No results for input(s): CKTOTAL, CKMB, CKMBINDEX, TROPONINI in the last 168 hours. BNP (last 3 results) Recent Labs    10/08/17 1148  PROBNP 335.0*   HbA1C: No results for input(s): HGBA1C in the last 72  hours. CBG: No results for input(s): GLUCAP in the last 168 hours. Lipid Profile: No results for input(s): CHOL, HDL, LDLCALC, TRIG, CHOLHDL, LDLDIRECT in the last 72 hours. Thyroid Function Tests: No results for input(s): TSH, T4TOTAL, FREET4, T3FREE, THYROIDAB in the last 72 hours. Anemia Panel: Recent Labs    06/09/18 1636  VITAMINB12 572  FOLATE 12.3  FERRITIN 18*  TIBC 468*  IRON 8*  RETICCTPCT 2.1   Sepsis Labs: Recent Labs  Lab 06/09/18 1214 06/09/18 1448 06/09/18 1636 06/10/18 0752 06/11/18 0307  PROCALCITON  --   --  1.48 1.90 1.33  LATICACIDVEN 8.50* 6.78*  --   --   --     Recent Results (from the past 240 hour(s))  Blood culture (routine x 2)     Status: None (Preliminary result)   Collection Time: 06/09/18  2:55 PM  Result Value Ref Range Status   Specimen Description BLOOD LEFT HAND  Final   Special Requests   Final     BOTTLES DRAWN AEROBIC AND ANAEROBIC Blood Culture adequate volume   Culture   Final    NO GROWTH 2 DAYS Performed at [facility], [place] [address] [place], [place] [address]    Report Status PENDING  Incomplete  Blood culture (routine x 2)     Status: None (Preliminary result)   Collection Time: 06/09/18  8:51 PM  Result Value Ref Range Status   Specimen Description BLOOD LEFT HAND  Final   Special Requests   Final    BOTTLES DRAWN AEROBIC ONLY Blood Culture adequate volume   Culture   Final    NO GROWTH 2 DAYS Performed at [facility], [place] [address] [place], [place] [address]    Report Status PENDING  Incomplete  MRSA PCR Screening     Status: None   Collection Time: 06/09/18 10:11 PM  Result Value Ref Range Status   MRSA by PCR NEGATIVE NEGATIVE Final    Comment:        The GeneXpert MRSA Assay (FDA approved for NASAL specimens only), is one component of a comprehensive MRSA colonization surveillance program. It is not intended to diagnose MRSA infection nor to guide or monitor treatment for MRSA infections. Performed at [facility], [place] [address] [place], [place] [address]          Radiology Studies: Dg Abd 1 View  Result Date: 06/11/2018 CLINICAL DATA:  Constipation. EXAM: ABDOMEN - 1 VIEW COMPARISON:  CT scan 10/22/2015 FINDINGS: No gaseous bowel dilatation to suggest obstruction. No substantial stool volume in the colon. Calcified gallstones evident right upper quadrant. Visualized bony anatomy unremarkable. IMPRESSION: Nonspecific bowel gas pattern. No substantial stool volume in the colon. Electronically Signed   By: Misty Stanley M.D.   On: 06/11/2018 16:15        Scheduled Meds: . sodium chloride   Intravenous Once  . feeding supplement  1 Container Oral TID BM  . methylPREDNISolone (SOLU-MEDROL) injection  60 mg Intravenous Q6H  . multivitamin with minerals  1 tablet Oral Daily  . pantoprazole  40 mg Oral BID  . polyethylene  glycol-electrolytes  4,000 mL Oral Once   Continuous Infusions: . sodium chloride Stopped (06/12/18 1121)  . ceFEPime (MAXIPIME) IV Stopped (06/11/18 2114)     LOS: 3 days     Georgette Shell, MD [facility]  If 7PM-7AM, please contact night-coverage [email or web address] Password [email or web address] 06/12/2018, 3:02 PM

## 2018-06-12 NOTE — Anesthesia Postprocedure Evaluation (Signed)
Anesthesia Post Note  Patient: Antonio Wise  Procedure(s) Performed: ESOPHAGOGASTRODUODENOSCOPY (EGD) WITH PROPOFOL (N/A )     Patient location during evaluation: PACU Anesthesia Type: MAC Level of consciousness: awake and alert Pain management: pain level controlled Vital Signs Assessment: post-procedure vital signs reviewed and stable Respiratory status: spontaneous breathing, nonlabored ventilation, respiratory function stable and patient connected to nasal cannula oxygen Cardiovascular status: stable and blood pressure returned to baseline Postop Assessment: no apparent nausea or vomiting Anesthetic complications: no    Last Vitals:  Vitals:   06/12/18 1220 06/12/18 1230  BP: (!) 146/92 (!) 143/77  Pulse: (!) 136 (!) 140  Resp: (!) 24 18  Temp: 36.6 C   SpO2: 98% 98%    Last Pain:  Vitals:   06/12/18 1230  TempSrc:   PainSc: 0-No pain                 [name] DAVID

## 2018-06-12 NOTE — Transfer of Care (Signed)
Immediate Anesthesia Transfer of Care Note  Patient: Antonio Wise  Procedure(s) Performed: ESOPHAGOGASTRODUODENOSCOPY (EGD) WITH PROPOFOL (N/A )  Patient Location: Endoscopy Unit  Anesthesia Type:MAC  Level of Consciousness: awake, alert  and oriented  Airway & Oxygen Therapy: Patient Spontanous Breathing and Patient connected to nasal cannula oxygen  Post-op Assessment: Report given to RN and Post -op Vital signs reviewed and stable  Post vital signs: Reviewed and stable  Last Vitals:  Vitals Value Taken Time  BP 146/92 06/12/2018 12:22 PM  Temp 36.6 C 06/12/2018 12:20 PM  Pulse 138 06/12/2018 12:22 PM  Resp 18 06/12/2018 12:22 PM  SpO2 97 % 06/12/2018 12:22 PM  Vitals shown include unvalidated device data.  Last Pain:  Vitals:   06/12/18 1220  TempSrc: Oral  PainSc: 0-No pain         Complications: No apparent anesthesia complications

## 2018-06-12 NOTE — Op Note (Signed)
[facility] Patient Name: Antonio Wise Procedure Date : 06/12/2018 MRN: [id number] Attending MD: Nancy Fetter Dr., MD Date of Birth: 02-Sep-1940 CSN: [id number] Age: 77 Admit Type: Inpatient Procedure:                Upper GI endoscopy Indications:              Iron deficiency anemia secondary to chronic blood                            loss, Heme positive stool Providers:                Jeneen Rinks L. Jonathan Kirkendoll Dr., MD, Cleda Daub, RN,                            Laverda Sorenson, Technician, Edmonia Everado Pillsbury, CRNA Referring MD:              Medicines:                Monitored Anesthesia Care Complications:            No immediate complications. Estimated Blood Loss:     Estimated blood loss: none. Procedure:                Pre-Anesthesia Assessment:                           - Prior to the procedure, a History and Physical                            was performed, and patient medications and                            allergies were reviewed. The patient's tolerance of                            previous anesthesia was also reviewed. The risks                            and benefits of the procedure and the sedation                            options and risks were discussed with the patient.                            All questions were answered, and informed consent                            was obtained. Prior Anticoagulants: The patient has                            taken Xarelto (rivaroxaban), last dose was 3 days                            prior to procedure. ASA Grade Assessment: III - A  patient with severe systemic disease. After                            reviewing the risks and benefits, the patient was                            deemed in satisfactory condition to undergo the                            procedure.                           After obtaining informed consent, the endoscope was                            passed under direct  vision. Throughout the                            procedure, the patient's blood pressure, pulse, and                            oxygen saturations were monitored continuously. The                            GIF-H190 ([id number]) Olympus adult EGD was introduced                            through the mouth, and advanced to the second part                            of duodenum. The upper GI endoscopy was                            accomplished without difficulty. The patient                            tolerated the procedure well. Scope In: Scope Out: Findings:      A medium-sized hiatal hernia was present.      There is no endoscopic evidence of Barrett's esophagus, bleeding,       esophagitis, stricture, ulcerations or varices in the entire esophagus.      The stomach was normal.      The examined duodenum was normal. Impression:               - Medium-sized hiatal hernia.                           - Normal stomach.                           - Normal examined duodenum.                           - No specimens collected.                           -  Anemia-no etiology evident on endoscopic                            evaluation Recommendation:           - Return patient to hospital ward for ongoing care.                           - Perform a colonoscopy tomorrow. Procedure Code(s):        --- Professional ---                           [phone], Esophagogastroduodenoscopy, flexible,                            transoral; diagnostic, including collection of                            specimen(s) by brushing or washing, when performed                            (separate procedure) Diagnosis Code(s):        --- Professional ---                           D50.0, Iron deficiency anemia secondary to blood                            loss (chronic)                           R19.5, Other fecal abnormalities                           K44.9, Diaphragmatic hernia without obstruction or                             gangrene CPT copyright 2018 [employer]. All rights reserved. The codes documented in this report are preliminary and upon coder review may  be revised to meet current compliance requirements. Nancy Fetter Dr., MD 06/12/2018 12:14:55 PM This report has been signed electronically. Number of Addenda: 0

## 2018-06-12 NOTE — Evaluation (Signed)
Physical Therapy Evaluation Patient Details Name: Antonio Wise MRN: [id number] DOB: 10-03-1940 Today's Date: 06/12/2018   History of Present Illness  77 y.o. male with medical history significant of O2 dependent COPD, uses 2 L of oxygen 24 hours a day, chronic GI bleed, history of duodenal ulcer, gastric ulcer, diverticulitis, atrial fibrillation on Xarelto, hypertension, hyperlipidemia admitted with progressive worsening of shortness of breath for the last few days prior to admission to the hospital.  Patient lives alone.  He was found on the floor by the family after being on the floor for approximately 4 hours.  He complains of generalized weakness and difficulty breathing upon arrival to the ER.  Working diagnoses of COPD exaccerbation, sepsis, and anemia due to GI bleed, recieved 3 units of blood, Hgb 9 as of 12/8  Clinical Impression   Pt admitted with above diagnosis. Pt currently with functional limitations due to the deficits listed below (see PT Problem List). Managing Independently prior to this admission, however daughters expressed concern for his declining health to me in confidence; Presents with fatigue and generalized weakness; The patient and family value dc'ing home, and daughters plan to stay with him initially; recommend maximizing HH services;  Pt will benefit from skilled PT to increase their independence and safety with mobility to allow discharge to the venue listed below.    Does he qualify for a HHAide (if he agrees to it?)     Follow Up Recommendations Home health PT;Other (comment)(as well as HHOT/RN, and possibly Aide)    Equipment Recommendations  Rolling walker with 5" wheels;3in1 (PT)    Recommendations for Other Services       Precautions / Restrictions Precautions Precautions: Fall Restrictions Other Position/Activity Restrictions: chronic supplemental O2 2 L      Mobility  Bed Mobility Overal bed mobility: Needs Assistance Bed Mobility: Supine to  Sit     Supine to sit: Min guard     General bed mobility comments: Cues to self-monitor for activity tolerance  Transfers Overall transfer level: Needs assistance Equipment used: 1 person hand held assist Transfers: Sit to/from Stand Sit to Stand: Min assist         General transfer comment: Min assist to steady  Ambulation/Gait Ambulation/Gait assistance: Min Web designer (Feet): 15 Feet Assistive device: IV Pole;1 person hand held assist Gait Pattern/deviations: Step-through pattern;Decreased step length - right;Decreased step length - left     General Gait Details: Noted tends to reach out for bilateral UE support with amb today; cues to self-monitor for activity tolerance; reports fatigue and weakness  Stairs            Wheelchair Mobility    Modified Rankin (Stroke Patients Only)       Balance Overall balance assessment: Needs assistance             Standing balance comment: Tends to reach out for UE support                             Pertinent Vitals/Pain Pain Assessment: No/denies pain    Home Living Family/patient expects to be discharged to:: Private residence Living Arrangements: Alone Available Help at Discharge: Family;Available PRN/intermittently(Daughters planning to arrange for 24 hour A initially) Type of Home: House Home Access: Level entry     Home Layout: One level        Prior Function Level of Independence: Independent         Comments:  Daughters report in confidence that they have been concerned about how he has been managing recently at home     Hand Dominance   Dominant Hand: Right    Extremity/Trunk Assessment   Upper Extremity Assessment Upper Extremity Assessment: Generalized weakness    Lower Extremity Assessment Lower Extremity Assessment: Generalized weakness       Communication   Communication: No difficulties  Cognition   Behavior During Therapy: WFL for tasks  assessed/performed Overall Cognitive Status: Within Functional Limits for tasks assessed                                        General Comments      Exercises     Assessment/Plan    PT Assessment Patient needs continued PT services  PT Problem List Decreased strength;Decreased activity tolerance;Decreased balance;Decreased mobility;Decreased knowledge of use of DME;Decreased safety awareness;Cardiopulmonary status limiting activity       PT Treatment Interventions DME instruction;Gait training;Functional mobility training;Therapeutic activities;Therapeutic exercise;Balance training;Patient/family education    PT Goals (Current goals can be found in the Care Plan section)  Acute Rehab PT Goals Patient Stated Goal: hopes EGD goes well PT Goal Formulation: With patient Time For Goal Achievement: 06/26/18 Potential to Achieve Goals: Fair    Frequency Min 3X/week   Barriers to discharge        Co-evaluation               AM-PAC PT "6 Clicks" Mobility  Outcome Measure Help needed turning from your back to your side while in a flat bed without using bedrails?: None Help needed moving from lying on your back to sitting on the side of a flat bed without using bedrails?: None Help needed moving to and from a bed to a chair (including a wheelchair)?: A Little Help needed standing up from a chair using your arms (e.g., wheelchair or bedside chair)?: A Little Help needed to walk in hospital room?: A Little Help needed climbing 3-5 steps with a railing? : A Lot 6 Click Score: 19    End of Session Equipment Utilized During Treatment: Gait belt;Oxygen Activity Tolerance: Patient limited by fatigue Patient left: in chair;with call bell/phone within reach;with family/visitor present Nurse Communication: Mobility status PT Visit Diagnosis: Unsteadiness on feet (R26.81);Muscle weakness (generalized) (M62.81)    Time: 4401-0272 PT Time Calculation (min) (ACUTE  ONLY): 18 min   Charges:   PT Evaluation $PT Eval Low Complexity: [place], PT  Acute Rehabilitation Services Pager [phone] Office [phone]   Colletta [place] 06/12/2018, 8:39 AM

## 2018-06-12 NOTE — Interval H&P Note (Signed)
History and Physical Interval Note:  06/12/2018 11:37 AM  Antonio Wise  has presented today for surgery, with the diagnosis of GI bleed  The various methods of treatment have been discussed with the patient and family. After consideration of risks, benefits and other options for treatment, the patient has consented to  Procedure(s): ESOPHAGOGASTRODUODENOSCOPY (EGD) WITH PROPOFOL (N/A) as a surgical intervention .  The patient's history has been reviewed, patient examined, no change in status, stable for surgery.  I have reviewed the patient's chart and labs.  Questions were answered to the patient's satisfaction.     Nancy Fetter

## 2018-06-13 ENCOUNTER — Encounter (HOSPITAL_COMMUNITY): Payer: Self-pay | Admitting: Gastroenterology

## 2018-06-13 ENCOUNTER — Inpatient Hospital Stay (HOSPITAL_COMMUNITY): Payer: PPO

## 2018-06-13 DIAGNOSIS — R0602 Shortness of breath: Secondary | ICD-10-CM

## 2018-06-13 LAB — PATHOLOGIST SMEAR REVIEW

## 2018-06-13 LAB — CBC
HCT: 33.1 % — ABNORMAL LOW (ref 39.0–52.0)
Hemoglobin: 9.2 g/dL — ABNORMAL LOW (ref 13.0–17.0)
MCH: 22.4 pg — ABNORMAL LOW (ref 26.0–34.0)
MCHC: 27.8 g/dL — ABNORMAL LOW (ref 30.0–36.0)
MCV: 80.7 fL (ref 80.0–100.0)
NRBC: 4.2 % — AB (ref 0.0–0.2)
Platelets: 391 10*3/uL (ref 150–400)
RBC: 4.1 MIL/uL — ABNORMAL LOW (ref 4.22–5.81)
RDW: 20.1 % — ABNORMAL HIGH (ref 11.5–15.5)
WBC: 18.3 10*3/uL — ABNORMAL HIGH (ref 4.0–10.5)

## 2018-06-13 LAB — COMPREHENSIVE METABOLIC PANEL
ALT: 164 U/L — AB (ref 0–44)
AST: 43 U/L — ABNORMAL HIGH (ref 15–41)
Albumin: 3.1 g/dL — ABNORMAL LOW (ref 3.5–5.0)
Alkaline Phosphatase: 58 U/L (ref 38–126)
Anion gap: 6 (ref 5–15)
BUN: 26 mg/dL — ABNORMAL HIGH (ref 8–23)
CO2: 29 mmol/L (ref 22–32)
Calcium: 8.3 mg/dL — ABNORMAL LOW (ref 8.9–10.3)
Chloride: 105 mmol/L (ref 98–111)
Creatinine, Ser: 1.46 mg/dL — ABNORMAL HIGH (ref 0.61–1.24)
GFR calc Af Amer: 54 mL/min — ABNORMAL LOW (ref >60)
GFR calc non Af Amer: 46 mL/min — ABNORMAL LOW (ref >60)
Glucose, Bld: 201 mg/dL — ABNORMAL HIGH (ref 70–99)
Potassium: 3.5 mmol/L (ref 3.5–5.1)
Sodium: 140 mmol/L (ref 135–145)
Total Bilirubin: 1.2 mg/dL (ref 0.3–1.2)
Total Protein: 5.4 g/dL — ABNORMAL LOW (ref 6.5–8.1)

## 2018-06-13 MED ORDER — FUROSEMIDE 10 MG/ML IJ SOLN
40.0000 mg | Freq: Once | INTRAMUSCULAR | Status: AC
  Administered 2018-06-13: 40 mg via INTRAVENOUS
  Filled 2018-06-13: qty 4

## 2018-06-13 MED ORDER — METOPROLOL TARTRATE 5 MG/5ML IV SOLN
5.0000 mg | Freq: Four times a day (QID) | INTRAVENOUS | Status: DC | PRN
  Administered 2018-06-13: 5 mg via INTRAVENOUS
  Filled 2018-06-13: qty 5

## 2018-06-13 MED ORDER — POLYETHYLENE GLYCOL 3350 17 G PO PACK
17.0000 g | PACK | ORAL | Status: AC
  Administered 2018-06-13 (×6): 17 g via ORAL
  Filled 2018-06-13 (×6): qty 1

## 2018-06-13 MED ORDER — DILTIAZEM HCL ER 60 MG PO CP12
240.0000 mg | ORAL_CAPSULE | Freq: Two times a day (BID) | ORAL | Status: DC
  Administered 2018-06-13 – 2018-06-16 (×6): 240 mg via ORAL
  Filled 2018-06-13 (×6): qty 4

## 2018-06-13 MED ORDER — METOPROLOL TARTRATE 50 MG PO TABS
50.0000 mg | ORAL_TABLET | Freq: Two times a day (BID) | ORAL | Status: DC
  Administered 2018-06-13 – 2018-06-16 (×6): 50 mg via ORAL
  Filled 2018-06-13 (×6): qty 1

## 2018-06-13 MED ORDER — FUROSEMIDE 40 MG PO TABS
40.0000 mg | ORAL_TABLET | Freq: Every day | ORAL | Status: DC
  Administered 2018-06-15 – 2018-06-16 (×2): 40 mg via ORAL
  Filled 2018-06-13 (×2): qty 1

## 2018-06-13 NOTE — Progress Notes (Signed)
Inpatient Diabetes Program Recommendations  AACE/ADA: New Consensus Statement on Inpatient Glycemic Control (2019)  Target Ranges:  Prepandial:   less than 140 mg/dL      Peak postprandial:   less than 180 mg/dL (1-2 hours)      Critically ill patients:  140 - 180 mg/dL  Results for EVONTE, PRESTAGE (MRN [id number]) as of 06/13/2018 12:10  Ref. Range 06/09/2018 16:36 06/10/2018 02:35 06/11/2018 03:07 06/12/2018 03:21 06/13/2018 02:03  Glucose Latest Ref Range: 70 - 99 mg/dL 202 (H) 215 (H) 245 (H) 295 (H) 201 (H)    Review of Glycemic Control  Diabetes history: No Outpatient Diabetes medications: NA Current orders for Inpatient glycemic control: None  Inpatient Diabetes Program Recommendations: Correction (SSI): While inpatient and ordered steroids, please consider ordering CBGs with Novolog correction scale ACHS.   Thanks, Barnie Alderman, RN, MSN, CDE Diabetes Coordinator Inpatient Diabetes Program [phone] (Team Pager from 8am to 5pm)

## 2018-06-13 NOTE — Progress Notes (Signed)
   06/13/18 1100  Clinical Encounter Type  Visited With Patient and family together  Visit Type Initial;Social support;Spiritual support  Spiritual Encounters  Spiritual Needs Emotional  Stress Factors  Patient Stress Factors Health changes;Loss of control  Family Stress Factors Major life changes   Introductory visit w/ pt and his daughter.  Compassionate presence as pt told about his journey with this medical condition, he is excited to actually have the colonoscopy today since he reports that it has been scheduled and unable to proceed since Jan.  Pt welcomes a f/u visit, knows to ask RN to pg chaplain if he desires visit before I round again.  Myra Gianotti resident, [phone]

## 2018-06-13 NOTE — Anesthesia Preprocedure Evaluation (Addendum)
Anesthesia Evaluation  Patient identified by MRN, date of birth, ID band Patient awake    Reviewed: Allergy & Precautions, H&P , NPO status , Patient's Chart, lab work & pertinent test results  Airway Mallampati: I  TM Distance: >3 FB Neck ROM: Full    Dental   Pulmonary COPD,  oxygen dependent, former smoker,    Pulmonary exam normal breath sounds clear to auscultation       Cardiovascular hypertension, Pt. on medications + Peripheral Vascular Disease  Normal cardiovascular exam+ dysrhythmias Atrial Fibrillation  Rhythm:Regular Rate:Normal     Neuro/Psych negative neurological ROS     GI/Hepatic Neg liver ROS, PUD,   Endo/Other  negative endocrine ROS  Renal/GU ARFRenal disease     Musculoskeletal   Abdominal   Peds  Hematology  (+) Blood dyscrasia, anemia ,   Anesthesia Other Findings   Reproductive/Obstetrics                            Anesthesia Physical  Anesthesia Plan  ASA: III  Anesthesia Plan: MAC   Post-op Pain Management:    Induction: Intravenous  PONV Risk Score and Plan: 1 and Treatment may vary due to age or medical condition and Propofol infusion  Airway Management Planned: Simple Face Mask and Natural Airway  Additional Equipment:   Intra-op Plan:   Post-operative Plan:   Informed Consent: I have reviewed the patients History and Physical, chart, labs and discussed the procedure including the risks, benefits and alternatives for the proposed anesthesia with the patient or authorized representative who has indicated his/her understanding and acceptance.     Plan Discussed with: CRNA, Surgeon and Anesthesiologist  Anesthesia Plan Comments:        Anesthesia Quick Evaluation

## 2018-06-13 NOTE — Progress Notes (Signed)
Physical Therapy Treatment Patient Details Name: Antonio Wise MRN: [id number] DOB: Mar 14, 1941 Today's Date: 06/13/2018    History of Present Illness 77 y.o. male with medical history significant of O2 dependent COPD, uses 2 L of oxygen 24 hours a day, chronic GI bleed, history of duodenal ulcer, gastric ulcer, diverticulitis, atrial fibrillation on Xarelto, hypertension, hyperlipidemia admitted with progressive worsening of shortness of breath for the last few days prior to admission to the hospital.  Patient lives alone.  He was found on the floor by the family after being on the floor for approximately 4 hours.  He complains of generalized weakness and difficulty breathing upon arrival to the ER.  Working diagnoses of COPD exaccerbation, sepsis, and anemia due to GI bleed, recieved 3 units of blood, Hgb 9 as of 12/8    PT Comments    Pt ambulated 7' with RW and min A, limited by frequent diarrhea today. HR fluctuated mid 120's to mid 130's with ambulation and O2 sats mid 90's on 2L O2. DOE 2/4, pt fatigued quickly. Highly recommend HHaide if possible as pt has difficulty with self care from an energy and balance standpoint. PT will continue to follow.    Follow Up Recommendations  Home health PT;Other (comment)(as well as HHOT/RN, and possibly Aide)     Equipment Recommendations  Rolling walker with 5" wheels;3in1 (PT)    Recommendations for Other Services       Precautions / Restrictions Precautions Precautions: Fall Restrictions Weight Bearing Restrictions: No Other Position/Activity Restrictions: chronic supplemental O2 2 L    Mobility  Bed Mobility               General bed mobility comments: pt received on BSC  Transfers Overall transfer level: Needs assistance Equipment used: Rolling walker (2 wheeled);None Transfers: Sit to/from Stand Sit to Stand: Supervision Stand pivot transfers: Min assist       General transfer comment: vc's for hand placement,  increased time needed, no physical assist given with sit to stand but needed min A to perform SPT without AD.   Ambulation/Gait Ambulation/Gait assistance: Min assist Gait Distance (Feet): 50 Feet Assistive device: Rolling walker (2 wheeled) Gait Pattern/deviations: Step-through pattern;Decreased step length - right;Decreased step length - left Gait velocity: decreased Gait velocity interpretation: <1.8 ft/sec, indicate of risk for recurrent falls General Gait Details: HR fluctuating today at rest and with mobility 120's to mid 130's. O2 sats mid 90's on 2L O2, DOE 2/4. Began to have diarrhea again while ambulating so had to return to [place] Mobility    Modified Rankin (Stroke Patients Only)       Balance Overall balance assessment: Needs assistance Sitting-balance support: No upper extremity supported Sitting balance-Leahy Scale: Good     Standing balance support: Bilateral upper extremity supported Standing balance-Leahy Scale: Poor Standing balance comment: needs UE support for safe standing                            Cognition Arousal/Alertness: Awake/alert Behavior During Therapy: WFL for tasks assessed/performed Overall Cognitive Status: Within Functional Limits for tasks assessed                                        Exercises      General Comments  General comments (skin integrity, edema, etc.): pt's balance such that I question his ability to clean self well while holding on with one hand in standing position      Pertinent Vitals/Pain Pain Assessment: No/denies pain    Home Living                      Prior Function            PT Goals (current goals can now be found in the care plan section) Acute Rehab PT Goals Patient Stated Goal: return home PT Goal Formulation: With patient Time For Goal Achievement: 06/26/18 Potential to Achieve Goals: Fair Progress towards PT goals:  Progressing toward goals    Frequency    Min 3X/week      PT Plan Current plan remains appropriate    Co-evaluation              AM-PAC PT "6 Clicks" Mobility   Outcome Measure  Help needed turning from your back to your side while in a flat bed without using bedrails?: None Help needed moving from lying on your back to sitting on the side of a flat bed without using bedrails?: None Help needed moving to and from a bed to a chair (including a wheelchair)?: A Little Help needed standing up from a chair using your arms (e.g., wheelchair or bedside chair)?: A Little Help needed to walk in hospital room?: A Little Help needed climbing 3-5 steps with a railing? : A Lot 6 Click Score: 19    End of Session Equipment Utilized During Treatment: Gait belt;Oxygen Activity Tolerance: Treatment limited secondary to medical complications (Comment)(diarrhea) Patient left: in chair;with call bell/phone within reach;with family/visitor present Nurse Communication: Mobility status PT Visit Diagnosis: Unsteadiness on feet (R26.81);Muscle weakness (generalized) (M62.81)     Time: 4193-7902 PT Time Calculation (min) (ACUTE ONLY): 36 min  Charges:  $Gait Training: 8-22 mins $Therapeutic Activity: 8-22 mins                     Leighton Roach, [place]  Pager [phone] Office [place] 06/13/2018, 1:55 PM

## 2018-06-13 NOTE — Care Management Note (Signed)
Case Management Note  Patient Details  Name: Antonio Wise MRN: [id number] Date of Birth: Jan 19, 1941  Subjective/Objective:                    Action/Plan:  Patient for colonoscopy today . From home alone with oxygen.   Will need face to face and orders  for home health PT . Expected Discharge Date:                  Expected Discharge Plan:  [place]  In-House Referral:     Discharge planning Services  CM Consult  Post Acute Care Choice:    Choice offered to:     DME Arranged:    DME Agency:     HH Arranged:    [place] Agency:     Status of Service:  In process, will continue to follow  If discussed at Long Length of Stay Meetings, dates discussed:    Additional Comments:  Marilu Favre, RN 06/13/2018, 1:40 PM

## 2018-06-13 NOTE — Progress Notes (Signed)
Antonio Wise 4:59 PM  Subjective: Patient without any signs of bleeding but was deemed to have formed stool earlier this morning and was elected to continue the prep 1 more day and he has done well with it and his case discussed with my partner Dr. [name] and his hospital computer chart reviewed as well as his office procedures including 2 previous colonoscopies and his case discussed with his family as well  Objective: Vital signs stable afebrile no acute distress abdomen is soft nontender hemoglobin stable BUN and creatinine decreased  Assessment: Probable lower GI bleeding currently stable  Plan: Hopefully he will be okay for colonoscopy tomorrow and tentatively scheduled at 930 and he was encouraged to drink more liquids  Antonio Wise  Pager [phone] After 5PM or if no answer call [phone]

## 2018-06-13 NOTE — Progress Notes (Addendum)
Paged MD to notify her of patients HR following administration of PRN Lopressor and scheduled Cardizem. Current HR 130.  116 Received order to administer PO Lopressor 50mg .

## 2018-06-13 NOTE — Progress Notes (Addendum)
Received call from tele stating pt's pulse maintaining in 140's checked on pt. He just finished a breathing tx and ended an aggravating call with family. Patient currently at 15- Md paged to be notified.   Received order for IV Lopressor. Administered at 0933. Will continue to monitor.

## 2018-06-13 NOTE — Progress Notes (Signed)
PROGRESS NOTE    Antonio Wise  ZOX:[id number] DOB: 1940-09-12 DOA: 06/09/2018 PCP: [name], PA-C  Brief Narrative:77 y.o.malewith medical history significant ofO2 dependent COPD, uses 2 L of oxygen 24 hours a day, chronic GI bleed, history of duodenal ulcer, gastric ulcer, diverticulitis, atrial fibrillation on Xarelto, hypertension, hyperlipidemia admitted with progressive worsening of shortness of breath for the last few days prior to admission to the hospital. Patient lives alone. He was found on the floor by the family after being on the floor for approximately 4 hours. He complains of generalized weakness and difficulty breathing upon arrival to the ER. He was placed on a CPAP and brought him to the hospital in the ER patient was placed on BiPAP. He denies fever, chills, cough, nausea vomiting diarrhea abdominal pain chest pain or palpitation. He denies urinary complaints. He does report decreased appetite he thinks he must have lost weight but no know how much. Reports decreased p.o. intake at home. Patient has been having chronic GI bleed with maroon stool, bright red bleeding per rectum on and off for almost a year now. He was supposed to have a colonoscopy by Dr. [name] with [facility]. However it has not happened it was always postponed due to some reason or the other.    ED Course:His vital signs were 114/90,temperature 97.4 pulse rate 116 respiration 21 saturation 93% on 4 L. He received Solu-Medrol and nebulizer treatments. His initial lactic acid level was 8.50 which came down to 6.780 without any intervention. Repeat lactic acid is pending at this time. His white count was 29.4 hemoglobin 5.8 platelet count 793. Sodium 143 potassium 4.2 BUN 29 creatinine 2.28 anion gap is 21. pH was 7.28 PCO2 54 bicarb 20 5 repeat ABG after BiPAP 7.32 CO2 still 54 bicarbonate 28. He was seen by GI Dr. [name] in the ER. Showed hazy opacity at the right lung base may be due to  atelectasis could also represent pneumonia or aspiration. Findings consistent with emphysema noted   Assessment & Plan:   Active Problems:   COPD with acute exacerbation (HCC)   GI bleed   Leukocytosis   AKI (acute kidney injury) ([place])   Transaminitis   Iron deficiency anemia due to chronic blood loss  #1 COPD exacerbationtaper solumedrol continue nebs.  Patient is feeling slightly better but continues to have shortness of breath.  #2 sepsis/SIRS upon admission with profound tachycardia tachypnea and leukocytosis with lactic acidosis.  Still a blood cultures with no growth to date.  Continue cefepime.   #3 AKI secondary to dehydration decreased p.o. intake and diuretics improved with IV hydration.  #4 anemia due to chronic GI blood loss Xarelto on hold -admission hemoglobin was 4.0.  EGD done yesterday 06/12/2018 shows no evidence of bleeding but hiatal hernia.  Patient was due to have colonoscopy today but with poor prep preparation. He is status post3units of blood transfusion. Hemoglobin today9.2.   #5 transaminitis improved  #6 elevated INRimproved  #7 tachycardia heart rate in 130s to 140s today.  Given dose of Lopressor .  Patient was n.p.o. for colonoscopy.  Restart and continue Cardizem and Coreg.  Will restart Lasix as patient received IV fluid boluses upon arrival to the ER with hypotension now with edema and increasing shortness of breath.  Check chest x-ray.  Severity patient at high risk for cardiac and respiratory decompensation due to anemia ongoing GI bleed and COPD exacerbation as well as sepsis. He is also in AKI with mild improvement  with dehydration.  Since his Xarelto been on hold since admission his bleeding has decreased.     Malnutrition Type:  Nutrition Problem: Inadequate oral intake Etiology: altered GI function, decreased appetite   Malnutrition Characteristics:  Signs/Symptoms: meal completion < 50%, per patient/family  report   Nutrition Interventions:  Interventions: Boost Breeze, Refer to RD note for recommendations, MVI  Estimated body mass index is 30.28 kg/m as calculated from the following:   Height as of this encounter: 6\' 1"  (1.854 m).   Weight as of this encounter: 104.1 kg.  DVT prophylaxis:  Code Status: Family Communication:  Disposition Plan:   Consultants:    Procedures: Antimicrobials:  Subjective:   Objective: Vitals:   06/13/18 0515 06/13/18 0834 06/13/18 1042 06/13/18 1330  BP: (!) 138/91  128/86 138/88  Pulse: (!) 125  (!) 125 (!) 115  Resp: 18   18  Temp: (!) 97.5 F (36.4 C)   97.7 F (36.5 C)  TempSrc: Oral   Oral  SpO2: 97% 96% 96% 94%  Weight:      Height:        Intake/Output Summary (Last 24 hours) at 06/13/2018 1359 Last data filed at 06/13/2018 1046 Gross per 24 hour  Intake 8.33 ml  Output 1825 ml  Net -1816.67 ml   Filed Weights   06/09/18 1223 06/09/18 2043  Weight: 105 kg 104.1 kg    Examination:  General exam: Appears calm and comfortable  Respiratory system: WHEEZING to auscultation. Respiratory effort normal. Cardiovascular system: S1 & S2 heard, RRR. No JVD, murmurs, rubs, gallops or clicks. No pedal edema. Gastrointestinal system: Abdomen is nondistended, soft and nontender. No organomegaly or masses felt. Normal bowel sounds heard. Central nervous system: Alert and oriented. No focal neurological deficits. Extremities:2 PLUS EDEMA Skin: No rashes, lesions or ulcers Psychiatry: Judgement and insight appear normal. Mood & affect appropriate.     Data Reviewed: I have personally reviewed following labs and imaging studies  CBC: Recent Labs  Lab 06/09/18 1253 06/09/18 1522 06/10/18 0235 06/11/18 0307 06/12/18 0321 06/13/18 0203  WBC 29.4* 19.0* 12.9* 18.1* 19.9* 18.3*  NEUTROABS 25.0* 17.3*  --   --   --   --   HGB 5.8* 4.7* 7.7* 9.3* 9.2* 9.2*  HCT 25.1* 20.4* 28.4* 33.0* 33.0* 33.1*  MCV 78.0* 76.4* 75.5* 77.3* 80.7  80.7  PLT 793* 613* 412* 397 396 009   Basic Metabolic Panel: Recent Labs  Lab 06/09/18 1636 06/10/18 0235 06/11/18 0307 06/12/18 0321 06/13/18 0203  NA 141 139 137 138 140  K 3.8 3.6 4.0 3.7 3.5  CL 101 99 100 103 105  CO2 20* 25 26 25 29   GLUCOSE 202* 215* 245* 295* 201*  BUN 33* 33* 26* 28* 26*  CREATININE 2.23* 2.19* 1.63* 1.76* 1.46*  CALCIUM 8.1* 8.3* 8.6* 8.2* 8.3*   GFR: Estimated Creatinine Clearance: 55.4 mL/min (A) (by C-G formula based on SCr of 1.46 mg/dL (H)). Liver Function Tests: Recent Labs  Lab 06/09/18 1636 06/10/18 0235 06/11/18 0307 06/12/18 0321 06/13/18 0203  AST 317* 222* 70* 44* 43*  ALT 319* 308* 229* 175* 164*  ALKPHOS 84 87 76 67 58  BILITOT 1.9* 1.7* 1.5* 1.2 1.2  PROT 5.9* 6.2* 5.7* 5.4* 5.4*  ALBUMIN 3.3* 3.5 3.3* 3.0* 3.1*   No results for input(s): LIPASE, AMYLASE in the last 168 hours. No results for input(s): AMMONIA in the last 168 hours. Coagulation Profile: Recent Labs  Lab 06/10/18 0235 06/11/18 0746  INR 6.00*  2.78   Cardiac Enzymes: No results for input(s): CKTOTAL, CKMB, CKMBINDEX, TROPONINI in the last 168 hours. BNP (last 3 results) Recent Labs    10/08/17 1148  PROBNP 335.0*   HbA1C: No results for input(s): HGBA1C in the last 72 hours. CBG: No results for input(s): GLUCAP in the last 168 hours. Lipid Profile: No results for input(s): CHOL, HDL, LDLCALC, TRIG, CHOLHDL, LDLDIRECT in the last 72 hours. Thyroid Function Tests: No results for input(s): TSH, T4TOTAL, FREET4, T3FREE, THYROIDAB in the last 72 hours. Anemia Panel: No results for input(s): VITAMINB12, FOLATE, FERRITIN, TIBC, IRON, RETICCTPCT in the last 72 hours. Sepsis Labs: Recent Labs  Lab 06/09/18 1214 06/09/18 1448 06/09/18 1636 06/10/18 0752 06/11/18 0307  PROCALCITON  --   --  1.48 1.90 1.33  LATICACIDVEN 8.50* 6.78*  --   --   --     Recent Results (from the past 240 hour(s))  Blood culture (routine x 2)     Status: None  (Preliminary result)   Collection Time: 06/09/18  2:55 PM  Result Value Ref Range Status   Specimen Description BLOOD LEFT HAND  Final   Special Requests   Final    BOTTLES DRAWN AEROBIC AND ANAEROBIC Blood Culture adequate volume   Culture   Final    NO GROWTH 3 DAYS Performed at [facility], [place] [address] [place], [place] [address]    Report Status PENDING  Incomplete  Blood culture (routine x 2)     Status: None (Preliminary result)   Collection Time: 06/09/18  8:51 PM  Result Value Ref Range Status   Specimen Description BLOOD LEFT HAND  Final   Special Requests   Final    BOTTLES DRAWN AEROBIC ONLY Blood Culture adequate volume   Culture   Final    NO GROWTH 3 DAYS Performed at [facility], [place] [address] [place], [place] [address]    Report Status PENDING  Incomplete  MRSA PCR Screening     Status: None   Collection Time: 06/09/18 10:11 PM  Result Value Ref Range Status   MRSA by PCR NEGATIVE NEGATIVE Final    Comment:        The GeneXpert MRSA Assay (FDA approved for NASAL specimens only), is one component of a comprehensive MRSA colonization surveillance program. It is not intended to diagnose MRSA infection nor to guide or monitor treatment for MRSA infections. Performed at [facility], [place] [address], [place], [place] [address]          Radiology Studies: No results found.      Scheduled Meds: . sodium chloride   Intravenous Once  . diltiazem  240 mg Oral BID  . feeding supplement  1 Container Oral TID BM  . [START ON 06/14/2018] furosemide  40 mg Oral Daily  . methylPREDNISolone (SOLU-MEDROL) injection  60 mg Intravenous Q6H  . metoprolol tartrate  50 mg Oral BID  . multivitamin with minerals  1 tablet Oral Daily  . pantoprazole  40 mg Oral BID  . polyethylene glycol  17 g Oral Q1 Hr x 6   Continuous Infusions: . ceFEPime (MAXIPIME) IV 2 g (06/12/18 2059)     LOS: 4 days        Georgette Shell, MD [facility]   If 7PM-7AM, please contact night-coverage [email or web address] Password [email or web address] 06/13/2018, 1:59 PM

## 2018-06-14 ENCOUNTER — Encounter (HOSPITAL_COMMUNITY): Payer: Self-pay | Admitting: *Deleted

## 2018-06-14 ENCOUNTER — Inpatient Hospital Stay (HOSPITAL_COMMUNITY): Payer: PPO | Admitting: Certified Registered Nurse Anesthetist

## 2018-06-14 ENCOUNTER — Inpatient Hospital Stay (HOSPITAL_COMMUNITY): Payer: PPO

## 2018-06-14 ENCOUNTER — Encounter (HOSPITAL_COMMUNITY)
Admission: EM | Disposition: A | Discharge status: Home Health | Payer: Self-pay | Source: Home / Self Care | Attending: Internal Medicine

## 2018-06-14 DIAGNOSIS — Z8711 Personal history of peptic ulcer disease: Secondary | ICD-10-CM

## 2018-06-14 DIAGNOSIS — F17211 Nicotine dependence, cigarettes, in remission: Secondary | ICD-10-CM

## 2018-06-14 DIAGNOSIS — I4891 Unspecified atrial fibrillation: Secondary | ICD-10-CM

## 2018-06-14 DIAGNOSIS — I739 Peripheral vascular disease, unspecified: Secondary | ICD-10-CM

## 2018-06-14 DIAGNOSIS — C20 Malignant neoplasm of rectum: Secondary | ICD-10-CM

## 2018-06-14 DIAGNOSIS — J449 Chronic obstructive pulmonary disease, unspecified: Secondary | ICD-10-CM

## 2018-06-14 DIAGNOSIS — D630 Anemia in neoplastic disease: Secondary | ICD-10-CM

## 2018-06-14 HISTORY — PX: BIOPSY: SHX5522

## 2018-06-14 HISTORY — PX: COLONOSCOPY WITH PROPOFOL: SHX5780

## 2018-06-14 LAB — CBC
HCT: 36.3 % — ABNORMAL LOW (ref 39.0–52.0)
Hemoglobin: 9.9 g/dL — ABNORMAL LOW (ref 13.0–17.0)
MCH: 22.7 pg — AB (ref 26.0–34.0)
MCHC: 27.3 g/dL — ABNORMAL LOW (ref 30.0–36.0)
MCV: 83.1 fL (ref 80.0–100.0)
Platelets: 338 10*3/uL (ref 150–400)
RBC: 4.37 MIL/uL (ref 4.22–5.81)
RDW: 23.7 % — ABNORMAL HIGH (ref 11.5–15.5)
WBC: 13.5 10*3/uL — ABNORMAL HIGH (ref 4.0–10.5)
nRBC: 4.2 % — ABNORMAL HIGH (ref 0.0–0.2)

## 2018-06-14 LAB — COMPREHENSIVE METABOLIC PANEL
ALT: 160 U/L — ABNORMAL HIGH (ref 0–44)
AST: 39 U/L (ref 15–41)
Albumin: 3.4 g/dL — ABNORMAL LOW (ref 3.5–5.0)
Alkaline Phosphatase: 56 U/L (ref 38–126)
Anion gap: 11 (ref 5–15)
BUN: 38 mg/dL — ABNORMAL HIGH (ref 8–23)
CO2: 23 mmol/L (ref 22–32)
Calcium: 8.4 mg/dL — ABNORMAL LOW (ref 8.9–10.3)
Chloride: 103 mmol/L (ref 98–111)
Creatinine, Ser: 1.58 mg/dL — ABNORMAL HIGH (ref 0.61–1.24)
GFR calc non Af Amer: 42 mL/min — ABNORMAL LOW (ref >60)
GFR, EST AFRICAN AMERICAN: 49 mL/min — AB (ref >60)
Glucose, Bld: 255 mg/dL — ABNORMAL HIGH (ref 70–99)
Potassium: 4.2 mmol/L (ref 3.5–5.1)
SODIUM: 137 mmol/L (ref 135–145)
Total Bilirubin: 1 mg/dL (ref 0.3–1.2)
Total Protein: 5.5 g/dL — ABNORMAL LOW (ref 6.5–8.1)

## 2018-06-14 LAB — CULTURE, BLOOD (ROUTINE X 2)
Culture: NO GROWTH
Culture: NO GROWTH
SPECIAL REQUESTS: ADEQUATE
Special Requests: ADEQUATE

## 2018-06-14 SURGERY — COLONOSCOPY WITH PROPOFOL
Anesthesia: Monitor Anesthesia Care

## 2018-06-14 MED ORDER — SODIUM CHLORIDE 0.9 % IV SOLN
INTRAVENOUS | Status: DC | PRN
  Administered 2018-06-14: 50 ug/min via INTRAVENOUS

## 2018-06-14 MED ORDER — SODIUM CHLORIDE 0.9 % IV SOLN: INTRAVENOUS | Status: DC

## 2018-06-14 MED ORDER — PROPOFOL 500 MG/50ML IV EMUL
INTRAVENOUS | Status: DC | PRN
  Administered 2018-06-14: 100 ug/kg/min via INTRAVENOUS

## 2018-06-14 MED ORDER — SODIUM CHLORIDE 0.9 % IV SOLN
INTRAVENOUS | Status: DC
  Administered 2018-06-14: 10:00:00 via INTRAVENOUS

## 2018-06-14 MED ORDER — PHENYLEPHRINE 40 MCG/ML (10ML) SYRINGE FOR IV PUSH (FOR BLOOD PRESSURE SUPPORT)
PREFILLED_SYRINGE | INTRAVENOUS | Status: DC | PRN
  Administered 2018-06-14: 160 ug via INTRAVENOUS
  Administered 2018-06-14: 120 ug via INTRAVENOUS
  Administered 2018-06-14: 200 ug via INTRAVENOUS
  Administered 2018-06-14: 120 ug via INTRAVENOUS

## 2018-06-14 MED ORDER — ALBUTEROL SULFATE HFA 108 (90 BASE) MCG/ACT IN AERS
INHALATION_SPRAY | RESPIRATORY_TRACT | Status: DC | PRN
  Administered 2018-06-14: 2 via RESPIRATORY_TRACT

## 2018-06-14 MED ORDER — PREDNISONE 20 MG PO TABS
40.0000 mg | ORAL_TABLET | Freq: Every day | ORAL | Status: DC
  Administered 2018-06-15 – 2018-06-16 (×2): 40 mg via ORAL
  Filled 2018-06-14 (×2): qty 2

## 2018-06-14 MED ORDER — PROPOFOL 10 MG/ML IV BOLUS
INTRAVENOUS | Status: DC | PRN
  Administered 2018-06-14: 30 mg via INTRAVENOUS
  Administered 2018-06-14: 20 mg via INTRAVENOUS

## 2018-06-14 SURGICAL SUPPLY — 22 items

## 2018-06-14 NOTE — Consult Note (Signed)
[facility] New Patient Consult   Requesting MD: Antonio Wise 77 y.o.  02/10/1941    Reason for Consult: Rectal cancer   HPI: Antonio Wise reports a history of rectal bleeding since early 2019.  He was maintained on anticoagulation therapy for atrial fibrillation prior to hospital admission.  He presents the emergency room after a syncope event on 06/09/2018.  The hemoglobin returned at 5.8 with an MCV of 78.  He was transfused with 3 units of packed red blood cells. Gastroenterology was consulted.  He was taken to a colonoscopy Dr. Watt Climes today.  A partially obstructing mass was found in the proximal rectum.  No bleeding was present.  Biopsies were obtained.  A few semi-sessile polyps were noted in the descending colon, transverse colon, ascending colon, and cecum.  The polyps were small and were not biopsied.  Stool was noted throughout the entire colon referring with visualization.     Past Medical History:  Diagnosis Date  . Colon polyp 05/06/2008   repeat 5 years  . COPD (chronic obstructive pulmonary disease) ([place])   . Duodenal ulcer 05/06/2008   EGD  . Elevated PSA   . Gastric ulcer 05/06/2008   EGD  . H/O Helicobacter infection 05/06/2008   EGD  . H/O: GI bleed   . History of nuclear stress test 02/19/2010   bruce protocol; mild-mod perfusion defect in spetal region consistent with attenuation artifact; no significant ischemia demonstrated; dysrhythmia during exercise  . Hyperlipidemia   . Hypertension   . Peripheral vascular disease ([place])    a. s/p left common iliac artery stenting in 2006  . Persistent atrial fibrillation    a. on Xarelto  . Smoker -quit in January 2019   . Vitamin D deficiency     Past Surgical History:  Procedure Laterality Date  . CATARACT EXTRACTION    . ESOPHAGOGASTRODUODENOSCOPY (EGD) WITH PROPOFOL N/A 06/12/2018   Procedure: ESOPHAGOGASTRODUODENOSCOPY (EGD) WITH PROPOFOL;  Surgeon: Laurence Spates, MD;   Location: [facility];  Service: Endoscopy;  Laterality: N/A;  . ILIAC VEIN ANGIOPLASTY / STENTING  05/19/2005   left CIA PTA & stenting   . TRANSTHORACIC ECHOCARDIOGRAM  06/10/2005   LV hyperdynamic; borderline LA enlargement; trace MR/TR/AVR    Medications: Reviewed  Allergies:  Allergies  Allergen Reactions  . Codeine Nausea And Vomiting and Other (See Comments)    Severe nausea    Family history: His mother had "cancer ".  No other family history of cancer  Social History:   He lives alone in [place].  His daughter lives across the street.  He quit smoking cigarettes in January 2019.  No alcohol use.  No transfusion history prior to this admission.  No risk factor for HIV or hepatitis.  ROS:   Positives include: Anorexia, 2-3 pound weight loss, rectal bleeding  A complete ROS was otherwise negative.  Physical Exam:  Blood pressure (!) 144/97, pulse 86, temperature 97.9 F (36.6 C), temperature source Oral, resp. rate 14, height 6\' 1"  (1.854 m), weight 229 lb 8 oz (104.1 kg), SpO2 97 %.  HEENT: Oral cavity without visible mass, neck without mass Lungs: Breath sounds with scattered rhonchi at the posterior chest bilaterally, no respiratory distress Cardiac: Distant heart sounds, irregular Abdomen: No hepatosplenomegaly, no mass, nontender GU: Testes without mass Vascular: Trace edema at the low leg bilaterally Lymph nodes: No cervical, supraclavicular, axillary, or inguinal nodes Neurologic: Alert, follows commands, motor exam appears intact in the upper and  lower extremities Musculoskeletal: No spine tenderness   LAB:  CBC  Lab Results  Component Value Date   WBC 13.5 (H) 06/14/2018   HGB 9.9 (L) 06/14/2018   HCT 36.3 (L) 06/14/2018   MCV 83.1 06/14/2018   PLT 338 06/14/2018   NEUTROABS 17.3 (H) 06/09/2018        CMP  Lab Results  Component Value Date   NA 137 06/14/2018   K 4.2 06/14/2018   CL 103 06/14/2018   CO2 23 06/14/2018   GLUCOSE 255  (H) 06/14/2018   BUN 38 (H) 06/14/2018   CREATININE 1.58 (H) 06/14/2018   CALCIUM 8.4 (L) 06/14/2018   PROT 5.5 (L) 06/14/2018   ALBUMIN 3.4 (L) 06/14/2018   AST 39 06/14/2018   ALT 160 (H) 06/14/2018   ALKPHOS 56 06/14/2018   BILITOT 1.0 06/14/2018   GFRNONAA 42 (L) 06/14/2018   GFRAA 49 (L) 06/14/2018     Assessment/Plan:   1. Rectal cancer  Proximal rectal mass noted on colonoscopy 06/14/2018, biopsy result pending  2. Multiple colon polyps on colonoscopy 06/14/2018-not biopsied or removed 3. Iron deficiency anemia secondary to #1 4. COPD 5. History of gastric and duodenal ulcers 6. Atrial fibrillation 7. Peripheral vascular disease   Disposition:   Antonio Wise was admitted with symptomatic anemia.  He has iron deficiency anemia.  He was found to have a rectal mass on colonoscopy today.  He appears to have rectal cancer.  I discussed the diagnosis of rectal cancer and the general treatment plan with Antonio Wise and his daughter.  He understands final treatment recommendations will depend on the staging evaluation and his ability to undergo surgery.  Recommendations: 1.  CTs chest, abdomen, and pelvis 2.  Staging pelvic MRI for T and N staging of the rectal tumor 3.  Check CEA 4.  Surgical and radiation oncology consults  I will continue following Antonio Wise in the hospital.  Outpatient follow-up will be scheduled at the Cancer center.   Betsy Coder, MD  06/14/2018, 4:57 PM

## 2018-06-14 NOTE — Anesthesia Procedure Notes (Signed)
Procedure Name: MAC Date/Time: 06/14/2018 9:43 AM Performed by: Lance Coon, CRNA Pre-anesthesia Checklist: Patient identified, Emergency Drugs available, Suction available, Patient being monitored and Timeout performed Patient Re-evaluated:Patient Re-evaluated prior to induction Oxygen Delivery Method: Simple face mask

## 2018-06-14 NOTE — Addendum Note (Signed)
Addendum  created 06/14/18 1854 by Lillia Abed, MD   Attestation recorded in [place], [place] filed

## 2018-06-14 NOTE — Consult Note (Signed)
[facility] Surgery Consult/Admission Note  Antonio Wise 1941/02/18  [id number].    Requesting MD: Dr. Watt Climes Chief Complaint/Reason for Consult: rectal mass  HPI:   Patient is a 77 yo male with a history of O2 dependent COPD, uses 2 L of oxygen 24 hours a day, chronic GI bleed, history of duodenal ulcer, gastric ulcer, diverticulitis, atrial fibrillation on Xarelto, hypertension, hyperlipidemia admitted with progressive worsening of shortness of breath for the last few days prior to admission to the hospital. He had a syncopal episode which brought him to the ED. He had a colonoscopy today by Dr. Watt Climes and it showed a partially obstructing rectal mass along with multiple colon polyps. It is suspicious for cancer and there are biopsies pending. Last colonoscopy was in 2015 and showed a few polyps. Pt states he has had progressively worsening rectal bleeding for 11 months and has been trying to get a colonoscopy since onset but has been unable to do so. He is still having BM's and passing flatus. He has decreased appetite and weakness. No other systemic symptoms.    No hx of abdominal surgeries Takes Xarelto for a. fib  ROS:  Review of Systems  Constitutional: Positive for malaise/fatigue. Negative for chills, diaphoresis and fever.  HENT: Negative for sore throat.   Respiratory: Negative for cough and shortness of breath.   Cardiovascular: Negative for chest pain.  Gastrointestinal: Positive for blood in stool. Negative for abdominal pain, constipation, diarrhea, nausea and vomiting.  Genitourinary: Negative for dysuria.  Skin: Negative for rash.  Neurological: Negative for dizziness, focal weakness and loss of consciousness.  All other systems reviewed and are negative.    Family History  Problem Relation Age of Onset  . Heart disease Mother   . COPD Father        smoked and worked in a [employer]  . Cancer Father   . Cancer Sister 93       Breast Cancer  . Diabetes  Brother     Past Medical History:  Diagnosis Date  . Colon polyp 05/06/2008   repeat 5 years  . COPD (chronic obstructive pulmonary disease) ([place])   . Duodenal ulcer 05/06/2008   EGD  . Elevated PSA   . Gastric ulcer 05/06/2008   EGD  . H/O Helicobacter infection 05/06/2008   EGD  . H/O: GI bleed   . History of nuclear stress test 02/19/2010   bruce protocol; mild-mod perfusion defect in spetal region consistent with attenuation artifact; no significant ischemia demonstrated; dysrhythmia during exercise  . Hyperlipidemia   . Hypertension   . Peripheral vascular disease ([place])    a. s/p left common iliac artery stenting in 2006  . Persistent atrial fibrillation    a. on Xarelto  . Smoker unmotivated to quit   . Vitamin D deficiency     Past Surgical History:  Procedure Laterality Date  . CATARACT EXTRACTION    . ESOPHAGOGASTRODUODENOSCOPY (EGD) WITH PROPOFOL N/A 06/12/2018   Procedure: ESOPHAGOGASTRODUODENOSCOPY (EGD) WITH PROPOFOL;  Surgeon: Laurence Spates, MD;  Location: [facility];  Service: Endoscopy;  Laterality: N/A;  . ILIAC VEIN ANGIOPLASTY / STENTING  05/19/2005   left CIA PTA & stenting   . TRANSTHORACIC ECHOCARDIOGRAM  06/10/2005   LV hyperdynamic; borderline LA enlargement; trace MR/TR/AVR    Social History:  reports that he quit smoking about 10 months ago. His smoking use included cigarettes. He has a 30.00 pack-year smoking history. He has never used smokeless tobacco. He reports that he  does not drink alcohol or use drugs.  Allergies:  Allergies  Allergen Reactions  . Codeine Nausea And Vomiting and Other (See Comments)    Severe nausea    Medications Prior to Admission  Medication Sig Dispense Refill  . acetaminophen (TYLENOL) 500 MG tablet Take 500 mg by mouth every 6 (six) hours as needed for moderate pain or headache.     . albuterol (PROAIR HFA) 108 (90 Base) MCG/ACT inhaler 2 puffs every 4 hours as needed only  if your can't catch your breath (Patient  taking differently: Inhale 2 puffs into the lungs See admin instructions. Inhale 2 puffs into the lungs every 4 hours as needed- "only if your can't catch your breath") 1 Inhaler 11  . atorvastatin (LIPITOR) 40 MG tablet TAKE 1 TABLET(40 MG) BY MOUTH DAILY AT 6 PM (Patient taking differently: Take 40 mg by mouth at bedtime. ) 90 tablet 3  . Cholecalciferol 4000 units CAPS Take 1 capsule (4,000 Units total) by mouth daily. 30 capsule 3  . diltiazem (CARDIZEM CD) 240 MG 24 hr capsule TAKE 2 CAPSULES(480 MG) BY MOUTH DAILY (Patient taking differently: Take 240 mg by mouth 2 (two) times daily. ) 60 capsule 2  . Fluticasone-Umeclidin-Vilant (TRELEGY ELLIPTA) 100-62.5-25 MCG/INH AEPB Inhale 1 puff into the lungs daily. 2 each 0  . furosemide (LASIX) 20 MG tablet TAKE 2 TABLETS BY MOUTH DAILY (Patient taking differently: Take 40 mg by mouth daily. ) 90 tablet 0  . levalbuterol (XOPENEX) 0.63 MG/3ML nebulizer solution USE 1 VIAL VIA NEBULIZER EVERY 6 HOURS AS NEEDED FOR WHEEZING OR SHORTNESS OF BREATH (Patient taking differently: Take 0.63 mg by nebulization every 6 (six) hours as needed for wheezing or shortness of breath. ) 1125 mL 5  . metoprolol tartrate (LOPRESSOR) 50 MG tablet TAKE 1 TABLET BY MOUTH TWICE DAILY (Patient taking differently: Take 50 mg by mouth 2 (two) times daily. ) 180 tablet 2  . OXYGEN Inhale 2 L into the lungs continuous.     Alveda Reasons 20 MG TABS tablet TAKE 1 TABLET(20 MG) BY MOUTH DAILY WITH SUPPER (Patient taking differently: Take 20 mg by mouth daily with supper. ) 90 tablet 1  . famotidine (PEPCID) 20 MG tablet Take 1 tablet (20 mg total) by mouth 2 (two) times daily as needed for heartburn. (Patient not taking: Reported on 06/09/2018) 30 tablet 0  . spironolactone (ALDACTONE) 25 MG tablet TAKE 1 TABLET(25 MG) BY MOUTH TWICE DAILY (Patient not taking: Reported on 06/09/2018) 60 tablet 2    Blood pressure 122/87, pulse 98, temperature 97.7 F (36.5 C), temperature source Axillary,  resp. rate 14, height 6\' 1"  (1.854 m), weight 104.1 kg, SpO2 97 %.  Physical Exam  Constitutional: He is oriented to person, place, and time. He appears well-developed and well-nourished. No distress.  HENT:  Head: Normocephalic and atraumatic.  Nose: Nose normal.  Mouth/Throat: Mucous membranes are normal.  Eyes: Pupils are equal, round, and reactive to light. Conjunctivae are normal. Right eye exhibits no discharge. Left eye exhibits no discharge. No scleral icterus.  Neck: Normal range of motion. Neck supple.  Cardiovascular: Normal rate, regular rhythm, normal heart sounds and intact distal pulses.  No murmur heard. Pulses:      Radial pulses are 2+ on the right side, and 2+ on the left side.  Pulmonary/Chest: Effort normal and breath sounds normal. No respiratory distress. He has no wheezes. He has no rhonchi. He has no rales.  Distant breaths sounds throughout  Abdominal:  Soft. Normal appearance and bowel sounds are normal. He exhibits no distension. There is no hepatosplenomegaly. There is no tenderness. There is no rigidity and no guarding.  Musculoskeletal: Normal range of motion. He exhibits edema (BLE 1+ pitting edema). He exhibits no tenderness or deformity.  Lymphadenopathy:    He has no cervical adenopathy.  Neurological: He is alert and oriented to person, place, and time.  Skin: Skin is warm and dry. No rash noted. He is not diaphoretic.  Psychiatric: He has a normal mood and affect.  Nursing note and vitals reviewed.   Results for orders placed or performed during the hospital encounter of 06/09/18 (from the past 48 hour(s))  CBC     Status: Abnormal   Collection Time: 06/13/18  2:03 AM  Result Value Ref Range   WBC 18.3 (H) 4.0 - 10.5 K/uL    Comment: WHITE COUNT CONFIRMED ON SMEAR   RBC 4.10 (L) 4.22 - 5.81 MIL/uL   Hemoglobin 9.2 (L) 13.0 - 17.0 g/dL   HCT 33.1 (L) 39.0 - 52.0 %   MCV 80.7 80.0 - 100.0 fL   MCH 22.4 (L) 26.0 - 34.0 pg   MCHC 27.8 (L) 30.0 -  36.0 g/dL   RDW 20.1 (H) 11.5 - 15.5 %   Platelets 391 150 - 400 K/uL   nRBC 4.2 (H) 0.0 - 0.2 %    Comment: Performed at [facility], [address], [place], [place] [address]  Comprehensive metabolic panel     Status: Abnormal   Collection Time: 06/13/18  2:03 AM  Result Value Ref Range   Sodium 140 135 - 145 mmol/L   Potassium 3.5 3.5 - 5.1 mmol/L   Chloride 105 98 - 111 mmol/L   CO2 29 22 - 32 mmol/L   Glucose, Bld 201 (H) 70 - 99 mg/dL   BUN 26 (H) 8 - 23 mg/dL   Creatinine, Ser 1.46 (H) 0.61 - 1.24 mg/dL   Calcium 8.3 (L) 8.9 - 10.3 mg/dL   Total Protein 5.4 (L) 6.5 - 8.1 g/dL   Albumin 3.1 (L) 3.5 - 5.0 g/dL   AST 43 (H) 15 - 41 U/L   ALT 164 (H) 0 - 44 U/L   Alkaline Phosphatase 58 38 - 126 U/L   Total Bilirubin 1.2 0.3 - 1.2 mg/dL   GFR calc non Af Amer 46 (L) >60 mL/min   GFR calc Af Amer 54 (L) >60 mL/min   Anion gap 6 5 - 15    Comment: Performed at [facility], [place] [address] [place], [place] [address]  Pathologist smear review     Status: None   Collection Time: 06/13/18  2:03 AM  Result Value Ref Range   Path Review      Neutrophilia.  Normocytic anemia.  Anisopoikilocytosis with burr cells.  Left shift with nucleated RBC.    Comment: Reviewed by Lennox Solders. Lyndon Code, M.D. 06/13/18. Performed at [facility], [place] [address] [place], [place] [address]    Dg Chest 1 View  Result Date: 06/13/2018 CLINICAL DATA:  Syncopal episode EXAM: CHEST  1 VIEW COMPARISON:  06/09/2018, 10/08/2017 FINDINGS: Small bilateral pleural effusions. Hazy edema or infiltrates at the bases, increased compared to prior. Stable cardiomediastinal silhouette with aortic atherosclerosis. No pneumothorax. Worsening airspace disease at both bases. IMPRESSION: Small bilateral pleural effusions with worsening bibasilar airspace disease which may be secondary to edema or pneumonia. Electronically Signed   By: Madie Reno.D.  On: 06/13/2018 20:14      Assessment/Plan Active  Problems:   COPD with acute exacerbation (HCC)   SOB (shortness of breath)   GI bleed   Leukocytosis   AKI (acute kidney injury) ([place])   Transaminitis   Iron deficiency anemia due to chronic blood loss  Partially obstruction rectal mass - S/P colonoscopy, Dr. Watt Climes, 12/10 suspects this is malignant - biopsies pending - oncology consult pending - CT abd/pel pending - nothing surgical at this time unless pt becomes obstructed. Will await biopsy results. If malignant, would recommend neoadjuvant chemo/radiation and follow up with one of our colorectal specialists  Thank you for the consult.    Kalman Drape, [facility] 06/14/2018, 12:28 PM Pager: [phone] Consults: [phone] Mon-Fri 7:00 am-4:30 pm Sat-Sun 7:00 am-11:30 am

## 2018-06-14 NOTE — Progress Notes (Signed)
Nutrition Follow-up  DOCUMENTATION CODES:   Obesity unspecified  INTERVENTION:   -Continue Boost Breeze po TID, each supplement provides 250 kcal and 9 grams of protein -Continue MVI with minerals daily -RD will follow for diet advancement and adjust supplement regimen as appropriate -If prolonged NPO/clear liquid diet is anticipated, consider initiation of nutrition support  NUTRITION DIAGNOSIS:   Inadequate oral intake related to altered GI function, decreased appetite as evidenced by meal completion < 50%, per patient/family report.  Ongoing  GOAL:   Patient will meet greater than or equal to 90% of their needs  Progressing  MONITOR:   PO intake, Supplement acceptance, Diet advancement, Labs, Weight trends, Skin, I & O's  REASON FOR ASSESSMENT:   Malnutrition Screening Tool    ASSESSMENT:   Antonio Wise is a 77 y.o. male with medical history significant of O2 dependent COPD, uses 2 L of oxygen 24 hours a day, chronic GI bleed, history of duodenal ulcer, gastric ulcer, diverticulitis, atrial fibrillation on Xarelto, hypertension, hyperlipidemia admitted with progressive worsening of shortness of breath for the last few days prior to admission to the hospital  12/8- s/p EGD 12/10- s/p colonoscopy, which revealed fungating, infiltrative and ulcerated partially obstructing large mass in proximal rectum (biopsies taken), polyps in colon and rectum  Pt just returned to room at time of visit.   Case discussed with RN, who reports colonoscopy revealed possible malignant partially obstrucing tumor in proximal rectum. Plan for continued clear liquid diet today, in case of possible surgical intervention. RN reports tolerating clear liquids well; noted meal completion 100%.  Labs reviewed.  Diet Order:   Diet Order            Diet clear liquid Room service appropriate? Yes; Fluid consistency: Thin  Diet effective now              EDUCATION NEEDS:   Education needs  have been addressed  Skin:  Skin Assessment: Reviewed RN Assessment  Last BM:  06/13/18  Height:   Ht Readings from Last 1 Encounters:  06/09/18 6\' 1"  (1.854 m)    Weight:   Wt Readings from Last 1 Encounters:  06/09/18 104.1 kg    Ideal Body Weight:  83.6 kg  BMI:  Body mass index is 30.28 kg/m.  Estimated Nutritional Needs:   Kcal:  1800-2000  Protein:  100-115 grams  Fluid:  1.8-2.0 L    Brandye Inthavong A. Jimmye Norman, RD, LDN, CDE Pager: [phone] After hours Pager: [phone]

## 2018-06-14 NOTE — Progress Notes (Signed)
Delrae Sawyers 9:10 AM  Subjective: Patient doing well without any new complaints hopefully his prep is good enough no signs of bleeding  Objective: Vital signs stable afebrile no acute distress and please see previous assessment evaluation no new labs today  Assessment: Multiple medical problems including periodic bright red blood per rectum in a patient with a history of colon polyps  Plan: Okay to proceed with colonoscopy with anesthesia assistance  [facility] E  Pager [phone] After 5PM or if no answer call [phone]

## 2018-06-14 NOTE — Op Note (Signed)
[facility] Patient Name: Antonio Wise Procedure Date : 06/14/2018 MRN: [id number] Attending MD: Clarene Essex , MD Date of Birth: [birth year] CSN: [id number] Age: 77 Admit Type: Inpatient Procedure:                Colonoscopy Indications:              Last colonoscopy: 2017, Hematochezia, Personal                            history of colonic polyps Providers:                Clarene Essex, MD, Carlyn Reichert, RN, Cletis Athens,                            Technician, Lance Coon, CRNA Referring MD:              Medicines:                Propofol total dose 619 mg IV Complications:            No immediate complications. Estimated Blood Loss:     Estimated blood loss: none. Procedure:                Pre-Anesthesia Assessment:                           - Prior to the procedure, a History and Physical                            was performed, and patient medications and                            allergies were reviewed. The patient's tolerance of                            previous anesthesia was also reviewed. The risks                            and benefits of the procedure and the sedation                            options and risks were discussed with the patient.                            All questions were answered, and informed consent                            was obtained. Prior Anticoagulants: The patient has                            taken Xarelto (rivaroxaban), last dose was 3 days                            prior to procedure. ASA Grade Assessment: III - A  patient with severe systemic disease. After                            reviewing the risks and benefits, the patient was                            deemed in satisfactory condition to undergo the                            procedure.                           After obtaining informed consent, the colonoscope                            was passed under direct vision. Throughout the                            procedure, the patient's blood pressure, pulse, and                            oxygen saturations were monitored continuously. The                            CF-HQ190L ([id number] Olympus adult colon was                            introduced through the anus and advanced to the the                            cecum, identified by appendiceal orifice and                            ileocecal valve. The ileocecal valve, appendiceal                            orifice, and rectum were photographed. The                            colonoscopy was performed without difficulty. The                            patient tolerated the procedure well. The quality                            of the bowel preparation was fair. Scope In: 9:21:19 AM Scope Out: 10:07:14 AM Scope Withdrawal Time: 0 hours 14 minutes 36 seconds  Total Procedure Duration: 0 hours 20 minutes 55 seconds  Findings:      A fungating, infiltrative and ulcerated partially obstructing large mass       was found in the proximal rectum. The mass was circumferential. The mass       measured five cm in length. No bleeding was present. Biopsies were taken       with a cold forceps for histology. It was slightly difficult to  get the       scope past      A few semi-sessile polyps were found in the descending colon, transverse       colon, ascending colon and cecum. The polyps were small in size. None       were worrisome looking and none were biopsied      A moderate amount of semi-liquid stool was found in the entire colon,       interfering with visualization. Lavage of the area was performed using       copious amounts of sterile water, resulting in clearance with fair       visualization. Close to a liter of stool was suctioned      The exam was otherwise without abnormality. Impression:               - Preparation of the colon was fair.                           - Likely malignant partially obstructing tumor in                             the proximal rectum. Biopsied.                           - A few small polyps in the descending colon, in                            the transverse colon, in the ascending colon and in                            the cecum.                           - Stool in the entire examined colon.                           - The examination was otherwise normal. Recommendation:           - Clear liquid diet today. I would not advance in                            case surgical options are considered in order to                            make prep much easier- Continue present medications.                           - Await pathology results.                           - Repeat colonoscopy after studies are complete for                            surveillance based on pathology results.                           - Return to  GI office PRN.                           - Telephone GI clinic for pathology results in 3                            days.                           - Telephone GI clinic if symptomatic PRN.                           - Perform CT scan (computed tomography) of the                            abdomen/pelvis with contrast at appointment to be                            scheduled.                           - Refer to a colo-rectal surgeon today.                           - Refer to an oncologist today. Procedure Code(s):        --- Professional ---                           [phone], Colonoscopy, flexible; with biopsy, single                            or multiple Diagnosis Code(s):        --- Professional ---                           D49.0, Neoplasm of unspecified behavior of                            digestive system                           K56.690, Other partial intestinal obstruction                           D12.4, Benign neoplasm of descending colon                           D12.3, Benign neoplasm of transverse colon (hepatic                             flexure or splenic flexure)                           D12.2, Benign neoplasm of ascending colon                           D12.0, Benign neoplasm of cecum  K92.1, Melena (includes Hematochezia)                           Z86.010, Personal history of colonic polyps CPT copyright 2018 [employer]. All rights reserved. The codes documented in this report are preliminary and upon coder review may  be revised to meet current compliance requirements. Clarene Essex, MD 06/14/2018 10:18:10 AM This report has been signed electronically. Number of Addenda: 0

## 2018-06-14 NOTE — Progress Notes (Signed)
PROGRESS NOTE    Antonio Wise  GYF:[id number] DOB: 1941-07-03 DOA: 06/09/2018 PCP: [name], PA-C  Brief Narrative: 77 y.o.malewith medical history significant ofO2 dependent COPD, uses 2 L of oxygen 24 hours a day, chronic GI bleed, history of duodenal ulcer, gastric ulcer, diverticulitis, atrial fibrillation on Xarelto, hypertension, hyperlipidemia admitted with progressive worsening of shortness of breath for the last few days prior to admission to the hospital. Patient lives alone. He was found on the floor by the family after being on the floor for approximately 4 hours. He complains of generalized weakness and difficulty breathing upon arrival to the ER. He was placed on a CPAP and brought him to the hospital in the ER patient was placed on BiPAP. He denies fever, chills, cough, nausea vomiting diarrhea abdominal pain chest pain or palpitation. He denies urinary complaints. He does report decreased appetite he thinks he must have lost weight but no know how much. Reports decreased p.o. intake at home. Patient has been having chronic GI bleed with maroon stool, bright red bleeding per rectum on and off for almost a year now. He was supposed to have a colonoscopy by Dr. [name] with [facility]. However it has not happened it was always postponed due to some reason or the other.    Assessment & Plan:   Active Problems:   COPD with acute exacerbation (HCC)   SOB (shortness of breath)   GI bleed   Leukocytosis   AKI (acute kidney injury) (HCC)   Transaminitis   Iron deficiency anemia due to chronic blood loss    #1 COPD exacerbation-DC Solu-Medrol start p.o. Steroids.  #2 sepsis/SIRS upon admission with profound tachycardia tachypnea and leukocytosis with lactic acidosis.  Still a blood cultures with no growth to date.  DC cefepime   #3 AKI secondary to dehydration decreased p.o. intake and diuretics improved with IV hydration.  #4 anemia due to chronic GI blood loss  Xarelto on hold -admission hemoglobin was 4.0.  EGD done yesterday 06/12/2018 shows no evidence of bleeding but hiatal hernia.  Colonoscopy-likely malignant partially obstructing tumor in the proximal rectum biopsied.  Oncology consulted.  #5  Pretension continue Cardizem and Lopressor.   Malnutrition Type:  Nutrition Problem: Inadequate oral intake Etiology: altered GI function, decreased appetite   Malnutrition Characteristics:  Signs/Symptoms: meal completion < 50%, per patient/family report   Nutrition Interventions:  Interventions: Boost Breeze, Refer to RD note for recommendations, MVI  Estimated body mass index is 30.28 kg/m as calculated from the following:   Height as of this encounter: 6\' 1"  (1.854 m).   Weight as of this encounter: 104.1 kg.  DVT prophylaxis:scd Code Status:full Family Communication dw daughters Disposition Plan:pending oncology consult  Consultants:   onc gi surgery  Procedures none Antimicrobials:  none Subjective:seen past colonoscopy drowsy  Daughter by the bedside   Objective: Vitals:   06/14/18 1039 06/14/18 1045 06/14/18 1105 06/14/18 1500  BP: 122/70 (!) 121/48 122/87 (!) 144/97  Pulse:  82 98 86  Resp: 14 14 14 14   Temp:   97.7 F (36.5 C) 97.9 F (36.6 C)  TempSrc:   Axillary Oral  SpO2: 99% 96% 97% 97%  Weight:      Height:        Intake/Output Summary (Last 24 hours) at 06/14/2018 1514 Last data filed at 06/14/2018 1502 Gross per 24 hour  Intake 948.3 ml  Output 725 ml  Net 223.3 ml   Filed Weights   06/09/18 1223 06/09/18  2043  Weight: 105 kg 104.1 kg    Examination:  General exam: Appears calm and comfortable  Respiratory system: Clear to auscultation. Respiratory effort normal. Cardiovascular system: S1 & S2 heard, RRR. No JVD, murmurs, rubs, gallops or clicks. No pedal edema. Gastrointestinal system: Abdomen is nondistended, soft and nontender. No organomegaly or masses felt. Normal bowel sounds  heard. Central nervous system: Alert and oriented. No focal neurological deficits. Extremities: Symmetric 5 x 5 power. Skin: No rashes, lesions or ulcers Psychiatry: Judgement and insight appear normal. Mood & affect appropriate.     Data Reviewed: I have personally reviewed following labs and imaging studies  CBC: Recent Labs  Lab 06/09/18 1253 06/09/18 1522 06/10/18 0235 06/11/18 0307 06/12/18 0321 06/13/18 0203  WBC 29.4* 19.0* 12.9* 18.1* 19.9* 18.3*  NEUTROABS 25.0* 17.3*  --   --   --   --   HGB 5.8* 4.7* 7.7* 9.3* 9.2* 9.2*  HCT 25.1* 20.4* 28.4* 33.0* 33.0* 33.1*  MCV 78.0* 76.4* 75.5* 77.3* 80.7 80.7  PLT 793* 613* 412* 397 396 671   Basic Metabolic Panel: Recent Labs  Lab 06/09/18 1636 06/10/18 0235 06/11/18 0307 06/12/18 0321 06/13/18 0203  NA 141 139 137 138 140  K 3.8 3.6 4.0 3.7 3.5  CL 101 99 100 103 105  CO2 20* 25 26 25 29   GLUCOSE 202* 215* 245* 295* 201*  BUN 33* 33* 26* 28* 26*  CREATININE 2.23* 2.19* 1.63* 1.76* 1.46*  CALCIUM 8.1* 8.3* 8.6* 8.2* 8.3*   GFR: Estimated Creatinine Clearance: 55.4 mL/min (A) (by C-G formula based on SCr of 1.46 mg/dL (H)). Liver Function Tests: Recent Labs  Lab 06/09/18 1636 06/10/18 0235 06/11/18 0307 06/12/18 0321 06/13/18 0203  AST 317* 222* 70* 44* 43*  ALT 319* 308* 229* 175* 164*  ALKPHOS 84 87 76 67 58  BILITOT 1.9* 1.7* 1.5* 1.2 1.2  PROT 5.9* 6.2* 5.7* 5.4* 5.4*  ALBUMIN 3.3* 3.5 3.3* 3.0* 3.1*   No results for input(s): LIPASE, AMYLASE in the last 168 hours. No results for input(s): AMMONIA in the last 168 hours. Coagulation Profile: Recent Labs  Lab 06/10/18 0235 06/11/18 0746  INR 6.00* 2.78   Cardiac Enzymes: No results for input(s): CKTOTAL, CKMB, CKMBINDEX, TROPONINI in the last 168 hours. BNP (last 3 results) Recent Labs    10/08/17 1148  PROBNP 335.0*   HbA1C: No results for input(s): HGBA1C in the last 72 hours. CBG: No results for input(s): GLUCAP in the last 168  hours. Lipid Profile: No results for input(s): CHOL, HDL, LDLCALC, TRIG, CHOLHDL, LDLDIRECT in the last 72 hours. Thyroid Function Tests: No results for input(s): TSH, T4TOTAL, FREET4, T3FREE, THYROIDAB in the last 72 hours. Anemia Panel: No results for input(s): VITAMINB12, FOLATE, FERRITIN, TIBC, IRON, RETICCTPCT in the last 72 hours. Sepsis Labs: Recent Labs  Lab 06/09/18 1214 06/09/18 1448 06/09/18 1636 06/10/18 0752 06/11/18 0307  PROCALCITON  --   --  1.48 1.90 1.33  LATICACIDVEN 8.50* 6.78*  --   --   --     Recent Results (from the past 240 hour(s))  Blood culture (routine x 2)     Status: None   Collection Time: 06/09/18  2:55 PM  Result Value Ref Range Status   Specimen Description BLOOD LEFT HAND  Final   Special Requests   Final    BOTTLES DRAWN AEROBIC AND ANAEROBIC Blood Culture adequate volume   Culture   Final    NO GROWTH 5 DAYS Performed at [facility]  [facility], [place] [address] [place], [place] [address]    Report Status 06/14/2018 FINAL  Final  Blood culture (routine x 2)     Status: None   Collection Time: 06/09/18  8:51 PM  Result Value Ref Range Status   Specimen Description BLOOD LEFT HAND  Final   Special Requests   Final    BOTTLES DRAWN AEROBIC ONLY Blood Culture adequate volume   Culture   Final    NO GROWTH 5 DAYS Performed at [facility], [place] [address] [place], [place] [address]    Report Status 06/14/2018 FINAL  Final  MRSA PCR Screening     Status: None   Collection Time: 06/09/18 10:11 PM  Result Value Ref Range Status   MRSA by PCR NEGATIVE NEGATIVE Final    Comment:        The GeneXpert MRSA Assay (FDA approved for NASAL specimens only), is one component of a comprehensive MRSA colonization surveillance program. It is not intended to diagnose MRSA infection nor to guide or monitor treatment for MRSA infections. Performed at [facility], [place] [address] [place], [place] [address]          Radiology  Studies: Dg Chest 1 View  Result Date: 06/13/2018 CLINICAL DATA:  Syncopal episode EXAM: CHEST  1 VIEW COMPARISON:  06/09/2018, 10/08/2017 FINDINGS: Small bilateral pleural effusions. Hazy edema or infiltrates at the bases, increased compared to prior. Stable cardiomediastinal silhouette with aortic atherosclerosis. No pneumothorax. Worsening airspace disease at both bases. IMPRESSION: Small bilateral pleural effusions with worsening bibasilar airspace disease which may be secondary to edema or pneumonia. Electronically Signed   By: Donavan Foil M.D.   On: 06/13/2018 20:14        Scheduled Meds: . sodium chloride   Intravenous Once  . diltiazem  240 mg Oral BID  . feeding supplement  1 Container Oral TID BM  . furosemide  40 mg Oral Daily  . methylPREDNISolone (SOLU-MEDROL) injection  60 mg Intravenous Q6H  . metoprolol tartrate  50 mg Oral BID  . multivitamin with minerals  1 tablet Oral Daily  . pantoprazole  40 mg Oral BID   Continuous Infusions: . ceFEPime (MAXIPIME) IV 2 g (06/13/18 1953)     LOS: 5 days    Georgette Shell, MD [facility] If 7PM-7AM, please contact night-coverage [email or web address] Password [email or web address] 06/14/2018, 3:14 PM

## 2018-06-14 NOTE — Transfer of Care (Signed)
Immediate Anesthesia Transfer of Care Note  Patient: Antonio Wise  Procedure(s) Performed: COLONOSCOPY WITH PROPOFOL (N/A ) BIOPSY  Patient Location: Endoscopy Unit  Anesthesia Type:MAC  Level of Consciousness: drowsy and patient cooperative  Airway & Oxygen Therapy: Patient Spontanous Breathing and Patient connected to face mask oxygen  Post-op Assessment: Report given to RN and Post -op Vital signs reviewed and stable  Post vital signs: Reviewed and stable  Last Vitals:  Vitals Value Taken Time  BP    Temp    Pulse    Resp    SpO2      Last Pain:  Vitals:   06/14/18 0831  TempSrc: Oral  PainSc: 0-No pain         Complications: No apparent anesthesia complications

## 2018-06-15 DIAGNOSIS — C189 Malignant neoplasm of colon, unspecified: Secondary | ICD-10-CM

## 2018-06-15 LAB — CBC WITH DIFFERENTIAL/PLATELET
Band Neutrophils: 0 %
Basophils Absolute: 0 10*3/uL (ref 0.0–0.1)
Basophils Relative: 0 %
Blasts: 0 %
Eosinophils Absolute: 0 10*3/uL (ref 0.0–0.5)
Eosinophils Relative: 0 %
HCT: 36.1 % — ABNORMAL LOW (ref 39.0–52.0)
HEMOGLOBIN: 9.5 g/dL — AB (ref 13.0–17.0)
Lymphocytes Relative: 3 %
Lymphs Abs: 0.3 10*3/uL — ABNORMAL LOW (ref 0.7–4.0)
MCH: 22.6 pg — ABNORMAL LOW (ref 26.0–34.0)
MCHC: 26.3 g/dL — ABNORMAL LOW (ref 30.0–36.0)
MCV: 86 fL (ref 80.0–100.0)
MONO ABS: 0.9 10*3/uL (ref 0.1–1.0)
MONOS PCT: 9 %
Metamyelocytes Relative: 0 %
Myelocytes: 0 %
NRBC: 4.4 % — AB (ref 0.0–0.2)
Neutro Abs: 8.6 10*3/uL — ABNORMAL HIGH (ref 1.7–7.7)
Neutrophils Relative %: 88 %
Other: 0 %
Platelets: 362 10*3/uL (ref 150–400)
Promyelocytes Relative: 0 %
RBC: 4.2 MIL/uL — ABNORMAL LOW (ref 4.22–5.81)
RDW: 24.5 % — ABNORMAL HIGH (ref 11.5–15.5)
WBC: 9.8 10*3/uL (ref 4.0–10.5)
nRBC: 0 /100 WBC

## 2018-06-15 LAB — COMPREHENSIVE METABOLIC PANEL
ALT: 140 U/L — ABNORMAL HIGH (ref 0–44)
AST: 31 U/L (ref 15–41)
Albumin: 3.2 g/dL — ABNORMAL LOW (ref 3.5–5.0)
Alkaline Phosphatase: 51 U/L (ref 38–126)
Anion gap: 9 (ref 5–15)
BUN: 37 mg/dL — ABNORMAL HIGH (ref 8–23)
CO2: 23 mmol/L (ref 22–32)
Calcium: 8.4 mg/dL — ABNORMAL LOW (ref 8.9–10.3)
Chloride: 105 mmol/L (ref 98–111)
Creatinine, Ser: 1.49 mg/dL — ABNORMAL HIGH (ref 0.61–1.24)
GFR calc Af Amer: 52 mL/min — ABNORMAL LOW (ref >60)
GFR calc non Af Amer: 45 mL/min — ABNORMAL LOW (ref >60)
Glucose, Bld: 242 mg/dL — ABNORMAL HIGH (ref 70–99)
Potassium: 4.4 mmol/L (ref 3.5–5.1)
SODIUM: 137 mmol/L (ref 135–145)
Total Bilirubin: 0.8 mg/dL (ref 0.3–1.2)
Total Protein: 5.2 g/dL — ABNORMAL LOW (ref 6.5–8.1)

## 2018-06-15 NOTE — Progress Notes (Signed)
[facility] Gastroenterology Progress Note  Antonio Wise 77 y.o. 03/03/1941  CC: Rectal bleeding   Subjective: No acute issues since procedure yesterday.  Has seen by oncology and surgery.  Denies abdominal pain.  Passing gas.  Denies further rectal bleeding.  Pathology pending  ROS : Negative for chest pain.  Mild  shortness of breath.  Objective: Vital signs in last 24 hours: Vitals:   06/14/18 2300 06/15/18 0532  BP:  117/81  Pulse:  81  Resp:  19  Temp: 97.7 F (36.5 C) 97.6 F (36.4 C)  SpO2:  92%    Physical Exam:  General.  Well-developed, well-nourished not in acute distress  Chest.  Fine basilar crackles.  No significant respiratory distress. ABD :  mild distended, bowel sounds present.  Nontender. Neuro: A/O X 3 Psych : Mood and affect normal.  Lab Results: Recent Labs    06/14/18 1452 06/15/18 0153  NA 137 137  K 4.2 4.4  CL 103 105  CO2 23 23  GLUCOSE 255* 242*  BUN 38* 37*  CREATININE 1.58* 1.49*  CALCIUM 8.4* 8.4*   Recent Labs    06/14/18 1452 06/15/18 0153  AST 39 31  ALT 160* 140*  ALKPHOS 56 51  BILITOT 1.0 0.8  PROT 5.5* 5.2*  ALBUMIN 3.4* 3.2*   Recent Labs    06/14/18 1452 06/15/18 0153  WBC 13.5* 9.8  NEUTROABS  --  8.6*  HGB 9.9* 9.5*  HCT 36.3* 36.1*  MCV 83.1 86.0  PLT 338 362   No results for input(s): LABPROT, INR in the last 72 hours.    Assessment/Plan: -Rectal mass.  Likely malignant.  Pathology pending. -Mild elevated ALT.  Normal T bili and alkaline phosphatase.  CT without contrast showed no evidence of  metastases but did show anasarca with pleural effusion as well as mild ascites.  Recommendations -------------------------- -Follow path results, CEA  -MRI pelvis  for staging per oncology -Okay to advance diet after MRI. -Check hepatitis panel -GI will follow  Otis Brace MD, [place] 06/15/2018, 9:32 AM  Contact #  [phone]

## 2018-06-15 NOTE — Progress Notes (Signed)
IP PROGRESS NOTE  Subjective:   Antonio Wise denies rectal bleeding.  No new complaint.  His daughters are at the bedside.  Objective: Vital signs in last 24 hours: Blood pressure 117/81, pulse 81, temperature 97.6 F (36.4 C), temperature source Oral, resp. rate 19, height 6\' 1"  (1.854 m), weight 229 lb 8 oz (104.1 kg), SpO2 92 %.  Intake/Output from previous day: 12/10 0701 - 12/11 0700 In: 950 [P.O.:750; I.V.:200] Out: 1325 [Urine:1325]  Physical Exam: Not performed today    Lab Results: Recent Labs    06/14/18 1452 06/15/18 0153  WBC 13.5* 9.8  HGB 9.9* 9.5*  HCT 36.3* 36.1*  PLT 338 362    BMET Recent Labs    06/14/18 1452 06/15/18 0153  NA 137 137  K 4.2 4.4  CL 103 105  CO2 23 23  GLUCOSE 255* 242*  BUN 38* 37*  CREATININE 1.58* 1.49*  CALCIUM 8.4* 8.4*    No results found for: CEA1  Studies/Results: Ct Abdomen Pelvis Wo Contrast  Result Date: 06/14/2018 CLINICAL DATA:  Diffuse abdominal pain.  History of duodenal ulcer. EXAM: CT ABDOMEN AND PELVIS WITHOUT CONTRAST TECHNIQUE: Multidetector CT imaging of the abdomen and pelvis was performed following the standard protocol without IV contrast. COMPARISON:  Noncontrast CT 10/22/2015. FINDINGS: Lower chest: New moderate size dependent bilateral pleural effusions with associated bibasilar atelectasis. There is coronary and aortic atherosclerosis. Mitral annular calcifications are noted. Hepatobiliary: No focal hepatic abnormalities are identified on noncontrast imaging. Several calcified gallstones are again noted. The gallbladder is incompletely distended with possible mild wall thickening, but no surrounding focal inflammation. There is no significant biliary dilatation. Pancreas: No pancreatic ductal dilatation, focal abnormality or focal surrounding inflammation identified. Spleen: Normal in size without focal abnormality. Adrenals/Urinary Tract: Both adrenal glands appear normal. Stable cyst posteriorly in the  interpolar region of the left kidney. No other focal renal abnormalities are identified. There is no evidence of urinary tract calculus or hydronephrosis. The bladder appears normal. Stomach/Bowel: No evidence of bowel wall thickening, distention or surrounding inflammatory change. The appendix appears normal. No colonic masses are identified. Vascular/Lymphatic: There are no enlarged abdominal or pelvic lymph nodes. There is moderate diffuse aortic and branch vessel atherosclerosis without acute vascular findings on noncontrast imaging. Reproductive: Stable mild enlargement of the prostate gland. The seminal vesicles appear normal. Other: Anasarca with diffuse soft tissue edema involving the intra-abdominal and subcutaneous fat. There is a small amount of fluid in both pericolic gutters and in the right lower quadrant. No focal extraluminal fluid or air collection identified. Edema surrounding the duodenum does not appear greater than the generalized edema. Musculoskeletal: No acute or significant osseous findings. Old fracture of the right 11th rib laterally with incomplete healing. IMPRESSION: 1. Generalized anasarca with diffuse soft tissue edema, moderate-sized bilateral pleural effusions and mild ascites. No focal fluid collections identified. 2. No evidence of bowel obstruction, perforation or focal surrounding inflammation. 3. Cholelithiasis. 4.  Aortic Atherosclerosis (ICD10-I70.0). Electronically Signed   By: Richardean Sale M.D.   On: 06/14/2018 16:17   Dg Chest 1 View  Result Date: 06/13/2018 CLINICAL DATA:  Syncopal episode EXAM: CHEST  1 VIEW COMPARISON:  06/09/2018, 10/08/2017 FINDINGS: Small bilateral pleural effusions. Hazy edema or infiltrates at the bases, increased compared to prior. Stable cardiomediastinal silhouette with aortic atherosclerosis. No pneumothorax. Worsening airspace disease at both bases. IMPRESSION: Small bilateral pleural effusions with worsening bibasilar airspace disease  which may be secondary to edema or pneumonia. Electronically Signed   By:  Donavan Foil M.D.   On: 06/13/2018 20:14    Medications: I have reviewed the patient's current medications.  Assessment/Plan:  1. Rectal cancer ? Proximal rectal mass noted on colonoscopy 06/14/2018, biopsy result pending ? 06/14/2018 -CT abdomen/pelvis-no contrast bilateral pleural effusions, mild ascites, no evidence of metastatic disease  2. Multiple colon polyps on colonoscopy 06/14/2018-not biopsied or removed 3. Iron deficiency anemia secondary to #1 4. COPD 5. History of gastric and duodenal ulcers 6. Atrial fibrillation 7. Peripheral vascular disease 8. Renal insufficiency  [name] appears stable.  The noncontrast CT abdomen/pelvis reveals no evidence of metastatic disease.  I reviewed the CT images and radiology.  There appears to be a mass at the rectosigmoid junction.  I will recommend a staging pelvic MRI to assess the location of the mass relative to the anal sphincter, pelvic adenopathy, and degree of bowel wall penetration.  The pathology from the rectal biopsy and CEA are pending.  Outpatient follow-up will be scheduled at the Cancer center.     LOS: 6 days   Antonio Coder, MD   06/15/2018, 8:46 AM

## 2018-06-15 NOTE — Progress Notes (Signed)
Physical Therapy Treatment Patient Details Name: Antonio Wise MRN: [id number] DOB: 08-14-1940 Today's Date: 06/15/2018    History of Present Illness 77 y.o. male with medical history significant of O2 dependent COPD, uses 2 L of oxygen 24 hours a day, chronic GI bleed, history of duodenal ulcer, gastric ulcer, diverticulitis, atrial fibrillation on Xarelto, hypertension, hyperlipidemia admitted with progressive worsening of shortness of breath for the last few days prior to admission to the hospital.  Patient lives alone.  He was found on the floor by the family after being on the floor for approximately 4 hours.  He complains of generalized weakness and difficulty breathing upon arrival to the ER.  Working diagnoses of COPD exaccerbation, sepsis, and anemia due to GI bleed, recieved 3 units of blood, Hgb 9 as of 12/8    PT Comments    Pt was seen for bed exercises as he was too fatigued to perform any gait exercises.  He tolerated them well, with no discomfort or complaints.  Will encourage OOB next visit, as pt is hoping to go directly home from hospital.  Follow acutely for these goals of strength and mobility with a focus on trying to increase endurance along with challenges of stairs.   Follow Up Recommendations  Home health PT;Other (comment)     Equipment Recommendations  Rolling walker with 5" wheels;3in1 (PT)    Recommendations for Other Services       Precautions / Restrictions Precautions Precautions: Fall Restrictions Weight Bearing Restrictions: No    Mobility  Bed Mobility Overal bed mobility: Needs Assistance Bed Mobility: (reposition to upright )     Supine to sit: Mod assist     General bed mobility comments: pt is in bed   Transfers                 General transfer comment: declined OOB  Ambulation/Gait                 Stairs             Wheelchair Mobility    Modified Rankin (Stroke Patients Only)       Balance                                             Cognition Arousal/Alertness: Awake/alert Behavior During Therapy: WFL for tasks assessed/performed Overall Cognitive Status: Within Functional Limits for tasks assessed                                        Exercises General Exercises - Lower Extremity Ankle Circles/Pumps: AROM;Both;5 reps Quad Sets: AROM;Both;10 reps Heel Slides: Strengthening;Both;10 reps Hip ABduction/ADduction: Strengthening;Both;10 reps Hip Flexion/Marching: AROM;Both;10 reps    General Comments        Pertinent Vitals/Pain Pain Assessment: No/denies pain    Home Living                      Prior Function            PT Goals (current goals can now be found in the care plan section) Acute Rehab PT Goals Patient Stated Goal: return home Progress towards PT goals: Progressing toward goals    Frequency    Min 3X/week      PT Plan Current  plan remains appropriate    Co-evaluation              AM-PAC PT "6 Clicks" Mobility   Outcome Measure  Help needed turning from your back to your side while in a flat bed without using bedrails?: None Help needed moving from lying on your back to sitting on the side of a flat bed without using bedrails?: A Little Help needed moving to and from a bed to a chair (including a wheelchair)?: A Little Help needed standing up from a chair using your arms (e.g., wheelchair or bedside chair)?: A Little Help needed to walk in hospital room?: A Little Help needed climbing 3-5 steps with a railing? : A Lot 6 Click Score: 18    End of Session Equipment Utilized During Treatment: Oxygen Activity Tolerance: Patient limited by fatigue Patient left: in bed;with call bell/phone within reach;with bed alarm set;with family/visitor present Nurse Communication: Mobility status PT Visit Diagnosis: Unsteadiness on feet (R26.81);Muscle weakness (generalized) (M62.81)     Time:  9324-1991 PT Time Calculation (min) (ACUTE ONLY): 16 min  Charges:  $Therapeutic Exercise: 8-22 mins                     Ramond Dial 06/15/2018, 8:05 PM   Mee Hives, PT MS Acute Rehab Dept. Number: [facility] [place]

## 2018-06-15 NOTE — Progress Notes (Signed)
Received call from pathology confirming adenocarcinoma on biopsy and will try to come by later today and discuss with patient

## 2018-06-15 NOTE — Progress Notes (Signed)
Discussed pathology results with patient and daughter and await MRI and if no plans for surgery soon can advance diet and please call us if we can be of any further assistance with this hospital stay and case discussed with Dr. Benay Spice yesterday

## 2018-06-15 NOTE — Progress Notes (Addendum)
PROGRESS NOTE    Antonio Wise  SWH:[id number] DOB: 08-Jun-1941 DOA: 06/09/2018 PCP: [name], PA-C  Brief Narrative:77 y.o.malewith medical history significant ofO2 dependent COPD, uses 2 L of oxygen 24 hours a day, chronic GI bleed, history of duodenal ulcer, gastric ulcer, diverticulitis, atrial fibrillation on Xarelto, hypertension, hyperlipidemia admitted with progressive worsening of shortness of breath for the last few days prior to admission to the hospital. Patient lives alone. He was found on the floor by the family after being on the floor for approximately 4 hours. He complains of generalized weakness and difficulty breathing upon arrival to the ER. He was placed on a CPAP and brought him to the hospital in the ER patient was placed on BiPAP. He denies fever, chills, cough, nausea vomiting diarrhea abdominal pain chest pain or palpitation. He denies urinary complaints. He does report decreased appetite he thinks he must have lost weight but no know how much. Reports decreased p.o. intake at home. Patient has been having chronic GI bleed with maroon stool, bright red bleeding per rectum on and off for almost a year now. He was supposed to have a colonoscopy by Dr. [name] with [facility]. However it has not happened it was always postponed due to some reason or the other.   Assessment & Plan:   Active Problems:   COPD with acute exacerbation (HCC)   SOB (shortness of breath)   GI bleed   Leukocytosis   AKI (acute kidney injury) ([place])   Transaminitis   Iron deficiency anemia due to chronic blood loss  #1 newly diagnosed adenocarcinoma of the rectum patient presented with bleeding per rectum for few weeks to months.  Colonoscopy showed nonobstructive rectal mass status post biopsy which showed adenocarcinoma.  Patient seen by oncology Dr. Benay Spice.  Ordered MRI of the pelvis for staging.  Advance diet as tolerated after the MRI.  #2 AKI resolved with  hydration  #3Sepsis picture on admit cultures negative.antibiotics stopped.  #4Anemia due to chronic gi blood loss-s/p blood transfusion.xarelto on hold.hemoglobin stable.DEFER TO GI WHEN AND IF XARELTO CAN BE RESTARTED?  #5HTN stable continue cardizem lopressor lasix  #6 S/P copd exacerbation stable on po steroids   #7 hallucinations-family wants to wait and see if he improves.they do not want me to order a CT head at this time.      Malnutrition Type:  Nutrition Problem: Inadequate oral intake Etiology: altered GI function, decreased appetite   Malnutrition Characteristics:  Signs/Symptoms: meal completion < 50%, per patient/family report   Nutrition Interventions:  Interventions: Boost Breeze, Refer to RD note for recommendations, MVI  Estimated body mass index is 30.28 kg/m as calculated from the following:   Height as of this encounter: 6\' 1"  (1.854 m).   Weight as of this encounter: 104.1 kg.  DVT prophylaxis-scd Code Status: full Family Communication:dw both daughters Disposition Plan:  Pending mri for staging  Consultants:  Gi onc surgery Procedures: egd colonoscopy  Antimicrobials:   none   Subjective:feels okay had loose non bloody bms..daughters report patient is talking out of his head from the time of admit..   Objective: Vitals:   06/14/18 1500 06/14/18 2124 06/14/18 2300 06/15/18 0532  BP: (!) 144/97 111/72  117/81  Pulse: 86 93  81  Resp: 14 16  19   Temp: 97.9 F (36.6 C)  97.7 F (36.5 C) 97.6 F (36.4 C)  TempSrc: Oral  Oral Oral  SpO2: 97% 98%  92%  Weight:  Height:        Intake/Output Summary (Last 24 hours) at 06/15/2018 1108 Last data filed at 06/15/2018 0900 Gross per 24 hour  Intake 1230 ml  Output 1825 ml  Net -595 ml   Filed Weights   06/09/18 1223 06/09/18 2043  Weight: 105 kg 104.1 kg    Examination:  General exam: Appears calm and comfortable  Respiratory system: few scattered rhonchi to auscultation.  Respiratory effort normal. Cardiovascular system: S1 & S2 heard, RRR. No JVD, murmurs, rubs, gallops or clicks. No pedal edema. Gastrointestinal system: Abdomen is nondistended, soft and nontender. No organomegaly or masses felt. Normal bowel sounds heard. Central nervous system: Alert and oriented. No focal neurological deficits. Extremities: trace edema Skin: No rashes, lesions or ulcers Psychiatry: Judgement and insight appear normal. Mood & affect appropriate.     Data Reviewed: I have personally reviewed following labs and imaging studies  CBC: Recent Labs  Lab 06/09/18 1253 06/09/18 1522  06/11/18 0307 06/12/18 0321 06/13/18 0203 06/14/18 1452 06/15/18 0153  WBC 29.4* 19.0*   < > 18.1* 19.9* 18.3* 13.5* 9.8  NEUTROABS 25.0* 17.3*  --   --   --   --   --  8.6*  HGB 5.8* 4.7*   < > 9.3* 9.2* 9.2* 9.9* 9.5*  HCT 25.1* 20.4*   < > 33.0* 33.0* 33.1* 36.3* 36.1*  MCV 78.0* 76.4*   < > 77.3* 80.7 80.7 83.1 86.0  PLT 793* 613*   < > 397 396 391 338 362   < > = values in this interval not displayed.   Basic Metabolic Panel: Recent Labs  Lab 06/11/18 0307 06/12/18 0321 06/13/18 0203 06/14/18 1452 06/15/18 0153  NA 137 138 140 137 137  K 4.0 3.7 3.5 4.2 4.4  CL 100 103 105 103 105  CO2 26 25 29 23 23   GLUCOSE 245* 295* 201* 255* 242*  BUN 26* 28* 26* 38* 37*  CREATININE 1.63* 1.76* 1.46* 1.58* 1.49*  CALCIUM 8.6* 8.2* 8.3* 8.4* 8.4*   GFR: Estimated Creatinine Clearance: 54.3 mL/min (A) (by C-G formula based on SCr of 1.49 mg/dL (H)). Liver Function Tests: Recent Labs  Lab 06/11/18 0307 06/12/18 0321 06/13/18 0203 06/14/18 1452 06/15/18 0153  AST 70* 44* 43* 39 31  ALT 229* 175* 164* 160* 140*  ALKPHOS 76 67 58 56 51  BILITOT 1.5* 1.2 1.2 1.0 0.8  PROT 5.7* 5.4* 5.4* 5.5* 5.2*  ALBUMIN 3.3* 3.0* 3.1* 3.4* 3.2*   No results for input(s): LIPASE, AMYLASE in the last 168 hours. No results for input(s): AMMONIA in the last 168 hours. Coagulation  Profile: Recent Labs  Lab 06/10/18 0235 06/11/18 0746  INR 6.00* 2.78   Cardiac Enzymes: No results for input(s): CKTOTAL, CKMB, CKMBINDEX, TROPONINI in the last 168 hours. BNP (last 3 results) Recent Labs    10/08/17 1148  PROBNP 335.0*   HbA1C: No results for input(s): HGBA1C in the last 72 hours. CBG: No results for input(s): GLUCAP in the last 168 hours. Lipid Profile: No results for input(s): CHOL, HDL, LDLCALC, TRIG, CHOLHDL, LDLDIRECT in the last 72 hours. Thyroid Function Tests: No results for input(s): TSH, T4TOTAL, FREET4, T3FREE, THYROIDAB in the last 72 hours. Anemia Panel: No results for input(s): VITAMINB12, FOLATE, FERRITIN, TIBC, IRON, RETICCTPCT in the last 72 hours. Sepsis Labs: Recent Labs  Lab 06/09/18 1214 06/09/18 1448 06/09/18 1636 06/10/18 0752 06/11/18 0307  PROCALCITON  --   --  1.48 1.90 1.33  LATICACIDVEN 8.50* 6.78*  --   --   --  Recent Results (from the past 240 hour(s))  Blood culture (routine x 2)     Status: None   Collection Time: 06/09/18  2:55 PM  Result Value Ref Range Status   Specimen Description BLOOD LEFT HAND  Final   Special Requests   Final    BOTTLES DRAWN AEROBIC AND ANAEROBIC Blood Culture adequate volume   Culture   Final    NO GROWTH 5 DAYS Performed at [facility], [address], [place], [place] [address]    Report Status 06/14/2018 FINAL  Final  Blood culture (routine x 2)     Status: None   Collection Time: 06/09/18  8:51 PM  Result Value Ref Range Status   Specimen Description BLOOD LEFT HAND  Final   Special Requests   Final    BOTTLES DRAWN AEROBIC ONLY Blood Culture adequate volume   Culture   Final    NO GROWTH 5 DAYS Performed at [facility], [place] [address] [place], [place] [address]    Report Status 06/14/2018 FINAL  Final  MRSA PCR Screening     Status: None   Collection Time: 06/09/18 10:11 PM  Result Value Ref Range Status   MRSA by PCR NEGATIVE NEGATIVE Final    Comment:         The GeneXpert MRSA Assay (FDA approved for NASAL specimens only), is one component of a comprehensive MRSA colonization surveillance program. It is not intended to diagnose MRSA infection nor to guide or monitor treatment for MRSA infections. Performed at [facility], [place] [address] [place],  [address]          Radiology Studies: Ct Abdomen Pelvis Wo Contrast  Result Date: 06/14/2018 CLINICAL DATA:  Diffuse abdominal pain.  History of duodenal ulcer. EXAM: CT ABDOMEN AND PELVIS WITHOUT CONTRAST TECHNIQUE: Multidetector CT imaging of the abdomen and pelvis was performed following the standard protocol without IV contrast. COMPARISON:  Noncontrast CT 10/22/2015. FINDINGS: Lower chest: New moderate size dependent bilateral pleural effusions with associated bibasilar atelectasis. There is coronary and aortic atherosclerosis. Mitral annular calcifications are noted. Hepatobiliary: No focal hepatic abnormalities are identified on noncontrast imaging. Several calcified gallstones are again noted. The gallbladder is incompletely distended with possible mild wall thickening, but no surrounding focal inflammation. There is no significant biliary dilatation. Pancreas: No pancreatic ductal dilatation, focal abnormality or focal surrounding inflammation identified. Spleen: Normal in size without focal abnormality. Adrenals/Urinary Tract: Both adrenal glands appear normal. Stable cyst posteriorly in the interpolar region of the left kidney. No other focal renal abnormalities are identified. There is no evidence of urinary tract calculus or hydronephrosis. The bladder appears normal. Stomach/Bowel: No evidence of bowel wall thickening, distention or surrounding inflammatory change. The appendix appears normal. No colonic masses are identified. Vascular/Lymphatic: There are no enlarged abdominal or pelvic lymph nodes. There is moderate diffuse aortic and branch vessel atherosclerosis  without acute vascular findings on noncontrast imaging. Reproductive: Stable mild enlargement of the prostate gland. The seminal vesicles appear normal. Other: Anasarca with diffuse soft tissue edema involving the intra-abdominal and subcutaneous fat. There is a small amount of fluid in both pericolic gutters and in the right lower quadrant. No focal extraluminal fluid or air collection identified. Edema surrounding the duodenum does not appear greater than the generalized edema. Musculoskeletal: No acute or significant osseous findings. Old fracture of the right 11th rib laterally with incomplete healing. IMPRESSION: 1. Generalized anasarca with diffuse soft tissue edema, moderate-sized bilateral pleural effusions  and mild ascites. No focal fluid collections identified. 2. No evidence of bowel obstruction, perforation or focal surrounding inflammation. 3. Cholelithiasis. 4.  Aortic Atherosclerosis (ICD10-I70.0). Electronically Signed   By: Richardean Sale M.D.   On: 06/14/2018 16:17   Dg Chest 1 View  Result Date: 06/13/2018 CLINICAL DATA:  Syncopal episode EXAM: CHEST  1 VIEW COMPARISON:  06/09/2018, 10/08/2017 FINDINGS: Small bilateral pleural effusions. Hazy edema or infiltrates at the bases, increased compared to prior. Stable cardiomediastinal silhouette with aortic atherosclerosis. No pneumothorax. Worsening airspace disease at both bases. IMPRESSION: Small bilateral pleural effusions with worsening bibasilar airspace disease which may be secondary to edema or pneumonia. Electronically Signed   By: Donavan Foil M.D.   On: 06/13/2018 20:14        Scheduled Meds: . sodium chloride   Intravenous Once  . diltiazem  240 mg Oral BID  . feeding supplement  1 Container Oral TID BM  . furosemide  40 mg Oral Daily  . metoprolol tartrate  50 mg Oral BID  . multivitamin with minerals  1 tablet Oral Daily  . pantoprazole  40 mg Oral BID  . predniSONE  40 mg Oral Q breakfast   Continuous  Infusions:   LOS: 6 days     Georgette Shell, MD [facility]  If 7PM-7AM, please contact night-coverage [email or web address] Password [email or web address] 06/15/2018, 11:08 AM

## 2018-06-15 NOTE — Progress Notes (Addendum)
Spoke with Dr. Jobe Igo ([facility] Radiologist) concerning MRI Pelvis.  Dr. Jobe Igo recommends MRI pelvis be scheduled as an outpatient procedure at [facility].  Staff at that location are familiar with patient prep for the patient prior to the MRI.  Made Dr. Zigmund Daniel aware of situation.

## 2018-06-15 NOTE — Final Consult Note (Signed)
[facility] Progress Note  1 Day Post-Op  Subjective: CC: rectal mass Patient is passing gas and tolerating a diet. No further rectal bleeding noted. Denies pain.   Objective: Vital signs in last 24 hours: Temp:  [97.6 F (36.4 C)-98 F (36.7 C)] 97.6 F (36.4 C) (12/11 0532) Pulse Rate:  [81-99] 81 (12/11 0532) Resp:  [12-19] 19 (12/11 0532) BP: (96-144)/(28-97) 117/81 (12/11 0532) SpO2:  [92 %-100 %] 92 % (12/11 0532) Last BM Date: 06/14/18  Intake/Output from previous day: 12/10 0701 - 12/11 0700 In: 950 [P.O.:750; I.V.:200] Out: 1325 [Urine:1325] Intake/Output this shift: Total I/O In: -  Out: 500 [Urine:500]  PE: Gen:  Alert, NAD, pleasant Card:  Irregularly irregular Pulm:  Normal effort, clear to auscultation bilaterally Abd: Soft, non-tender, non-distended, bowel sounds present Psych: A&Ox3   Lab Results:  Recent Labs    06/14/18 1452 06/15/18 0153  WBC 13.5* 9.8  HGB 9.9* 9.5*  HCT 36.3* 36.1*  PLT 338 362   BMET Recent Labs    06/14/18 1452 06/15/18 0153  NA 137 137  K 4.2 4.4  CL 103 105  CO2 23 23  GLUCOSE 255* 242*  BUN 38* 37*  CREATININE 1.58* 1.49*  CALCIUM 8.4* 8.4*   PT/INR No results for input(s): LABPROT, INR in the last 72 hours. CMP     Component Value Date/Time   NA 137 06/15/2018 0153   NA 140 11/16/2017 0940   K 4.4 06/15/2018 0153   CL 105 06/15/2018 0153   CO2 23 06/15/2018 0153   GLUCOSE 242 (H) 06/15/2018 0153   BUN 37 (H) 06/15/2018 0153   BUN 29 (H) 11/16/2017 0940   CREATININE 1.49 (H) 06/15/2018 0153   CREATININE 1.76 (H) 02/09/2018 0922   CALCIUM 8.4 (L) 06/15/2018 0153   PROT 5.2 (L) 06/15/2018 0153   ALBUMIN 3.2 (L) 06/15/2018 0153   AST 31 06/15/2018 0153   ALT 140 (H) 06/15/2018 0153   ALKPHOS 51 06/15/2018 0153   BILITOT 0.8 06/15/2018 0153   GFRNONAA 45 (L) 06/15/2018 0153   GFRNONAA 37 (L) 02/09/2018 0922   GFRAA 52 (L) 06/15/2018 0153   GFRAA 43 (L) 02/09/2018 0922   Lipase   No results found for: LIPASE     Studies/Results: Ct Abdomen Pelvis Wo Contrast  Result Date: 06/14/2018 CLINICAL DATA:  Diffuse abdominal pain.  History of duodenal ulcer. EXAM: CT ABDOMEN AND PELVIS WITHOUT CONTRAST TECHNIQUE: Multidetector CT imaging of the abdomen and pelvis was performed following the standard protocol without IV contrast. COMPARISON:  Noncontrast CT 10/22/2015. FINDINGS: Lower chest: New moderate size dependent bilateral pleural effusions with associated bibasilar atelectasis. There is coronary and aortic atherosclerosis. Mitral annular calcifications are noted. Hepatobiliary: No focal hepatic abnormalities are identified on noncontrast imaging. Several calcified gallstones are again noted. The gallbladder is incompletely distended with possible mild wall thickening, but no surrounding focal inflammation. There is no significant biliary dilatation. Pancreas: No pancreatic ductal dilatation, focal abnormality or focal surrounding inflammation identified. Spleen: Normal in size without focal abnormality. Adrenals/Urinary Tract: Both adrenal glands appear normal. Stable cyst posteriorly in the interpolar region of the left kidney. No other focal renal abnormalities are identified. There is no evidence of urinary tract calculus or hydronephrosis. The bladder appears normal. Stomach/Bowel: No evidence of bowel wall thickening, distention or surrounding inflammatory change. The appendix appears normal. No colonic masses are identified. Vascular/Lymphatic: There are no enlarged abdominal or pelvic lymph nodes. There is moderate diffuse aortic and branch vessel atherosclerosis  without acute vascular findings on noncontrast imaging. Reproductive: Stable mild enlargement of the prostate gland. The seminal vesicles appear normal. Other: Anasarca with diffuse soft tissue edema involving the intra-abdominal and subcutaneous fat. There is a small amount of fluid in both pericolic gutters and in  the right lower quadrant. No focal extraluminal fluid or air collection identified. Edema surrounding the duodenum does not appear greater than the generalized edema. Musculoskeletal: No acute or significant osseous findings. Old fracture of the right 11th rib laterally with incomplete healing. IMPRESSION: 1. Generalized anasarca with diffuse soft tissue edema, moderate-sized bilateral pleural effusions and mild ascites. No focal fluid collections identified. 2. No evidence of bowel obstruction, perforation or focal surrounding inflammation. 3. Cholelithiasis. 4.  Aortic Atherosclerosis (ICD10-I70.0). Electronically Signed   By: Richardean Sale M.D.   On: 06/14/2018 16:17   Dg Chest 1 View  Result Date: 06/13/2018 CLINICAL DATA:  Syncopal episode EXAM: CHEST  1 VIEW COMPARISON:  06/09/2018, 10/08/2017 FINDINGS: Small bilateral pleural effusions. Hazy edema or infiltrates at the bases, increased compared to prior. Stable cardiomediastinal silhouette with aortic atherosclerosis. No pneumothorax. Worsening airspace disease at both bases. IMPRESSION: Small bilateral pleural effusions with worsening bibasilar airspace disease which may be secondary to edema or pneumonia. Electronically Signed   By: Donavan Foil M.D.   On: 06/13/2018 20:14    Anti-infectives: Anti-infectives (From admission, onward)   Start     Dose/Rate Route Frequency Ordered Stop   06/11/18 1600  vancomycin (VANCOCIN) 1,750 mg in sodium chloride 0.9 % 500 mL IVPB  Status:  Discontinued     1,750 mg 250 mL/hr over 120 Minutes Intravenous Every 48 hours 06/09/18 1552 06/10/18 1312   06/11/18 1400  vancomycin (VANCOCIN) 1,750 mg in sodium chloride 0.9 % 500 mL IVPB  Status:  Discontinued     1,750 mg 250 mL/hr over 120 Minutes Intravenous Every 48 hours 06/09/18 1551 06/09/18 1552   06/09/18 2000  ceFEPIme (MAXIPIME) 2 g in sodium chloride 0.9 % 100 mL IVPB  Status:  Discontinued     2 g 200 mL/hr over 30 Minutes Intravenous Every 24  hours 06/09/18 1551 06/14/18 1545   06/09/18 1600  vancomycin (VANCOCIN) IVPB 1000 mg/200 mL premix     1,000 mg 200 mL/hr over 60 Minutes Intravenous STAT 06/09/18 1551 06/09/18 1800   06/09/18 1330  vancomycin (VANCOCIN) IVPB 1000 mg/200 mL premix     1,000 mg 200 mL/hr over 60 Minutes Intravenous  Once 06/09/18 1325 06/09/18 1654   06/09/18 1330  piperacillin-tazobactam (ZOSYN) IVPB 3.375 g     3.375 g 12.5 mL/hr over 240 Minutes Intravenous  Once 06/09/18 1325 06/09/18 1523       Assessment/Plan COPD with acute exacerbation (HCC)   SOB (shortness of breath)   GI bleed   Leukocytosis   AKI (acute kidney injury) ([place])   Transaminitis   Iron deficiency anemia due to chronic blood loss  Partially obstruction rectal mass - S/P colonoscopy, Dr. Watt Climes, 12/10 suspects this is malignant - biopsies pending, CEA pending - oncology following - CT abd/pel without evidence of metastatic disease - staging MRI pending - nothing surgical at this time unless pt becomes obstructed. Will await biopsy results. If malignant, would recommend neoadjuvant chemo/radiation and follow up with one of our colorectal specialists  I see no signs of obstruction and patient does not note any further bleeding at this time, Hgb stable. I will arrange for outpatient follow up with colorectal surgeon. Please call if we can  be of further assistance.   LOS: 6 days    Brigid Re , [facility] Surgery 06/15/2018, 9:27 AM Pager: [phone] Consults: [phone] Mon-Fri 7:00 am-4:30 pm Sat-Sun 7:00 am-11:30 am

## 2018-06-15 NOTE — Care Management Important Message (Signed)
Important Message  Patient Details  Name: Antonio WILDEN MRN: [id number] Date of Birth: [birth year]   Medicare Important Message Given:  Yes    Orbie Pyo 06/15/2018, 2:25 PM

## 2018-06-15 NOTE — Anesthesia Postprocedure Evaluation (Signed)
Anesthesia Post Note  Patient: Antonio Wise  Procedure(s) Performed: COLONOSCOPY WITH PROPOFOL (N/A ) BIOPSY     Patient location during evaluation: Endoscopy Anesthesia Type: MAC Level of consciousness: awake and alert Pain management: pain level controlled Vital Signs Assessment: post-procedure vital signs reviewed and stable Respiratory status: spontaneous breathing, nonlabored ventilation, respiratory function stable and patient connected to nasal cannula oxygen Cardiovascular status: stable and blood pressure returned to baseline Postop Assessment: no apparent nausea or vomiting Anesthetic complications: no    Last Vitals:  Vitals:   06/14/18 2300 06/15/18 0532  BP:  117/81  Pulse:  81  Resp:  19  Temp: 36.5 C 36.4 C  SpO2:  92%    Last Pain:  Vitals:   06/15/18 0819  TempSrc:   PainSc: 0-No pain                 Isaia Hassell S

## 2018-06-15 NOTE — Care Management Note (Signed)
Case Management Note  Patient Details  Name: Antonio Wise MRN: [id number] Date of Birth: 08-Sep-1940  Subjective/Objective:                    Action/Plan: Recommendations for home health PT/OT will need home health orders and face to face.  Expected Discharge Date:                  Expected Discharge Plan:  [place]  In-House Referral:     Discharge planning Services  CM Consult  Post Acute Care Choice:    Choice offered to:     DME Arranged:    DME Agency:     HH Arranged:    [place] Agency:     Status of Service:  In process, will continue to follow  If discussed at Long Length of Stay Meetings, dates discussed:    Additional Comments:  Marilu Favre, RN 06/15/2018, 1:12 PM

## 2018-06-16 ENCOUNTER — Inpatient Hospital Stay (HOSPITAL_COMMUNITY): Payer: PPO

## 2018-06-16 ENCOUNTER — Telehealth: Payer: Self-pay | Admitting: Cardiovascular Disease

## 2018-06-16 DIAGNOSIS — C20 Malignant neoplasm of rectum: Secondary | ICD-10-CM

## 2018-06-16 LAB — HEPATITIS B SURFACE ANTIGEN: Hepatitis B Surface Ag: NEGATIVE

## 2018-06-16 LAB — HEPATITIS C ANTIBODY (REFLEX): HCV Ab: <0.1 s/co ratio (ref 0.0–0.9)

## 2018-06-16 LAB — HCV COMMENT:

## 2018-06-16 MED ORDER — ADULT MULTIVITAMIN W/MINERALS CH: 1.0000 | ORAL_TABLET | Freq: Every day | ORAL | Status: AC

## 2018-06-16 MED ORDER — PREDNISONE 10 MG PO TABS: ORAL_TABLET | ORAL | 0 refills | Status: AC

## 2018-06-16 NOTE — Care Management Note (Signed)
Case Management Note  Patient Details  Name: ELLEN MAYOL MRN: [id number] Date of Birth: 1940-12-28  Subjective/Objective:                    Action/Plan:  Discussed discharge planning with patient at bedside. Patient's daughter left to pick up child from school.   Patient states he has home oxygen through [facility] and daughter will bring his portable oxygen tank to hospital. Order for walker , patient in agreement. Called Jeneen Rinks with [facility] for walker.   Left list of home health agencies with patient. Patient will call when daughter returns to room. Bedside nurse aware. Expected Discharge Date:  06/16/18               Expected Discharge Plan:  [place]  In-House Referral:     Discharge planning Services  CM Consult  Post Acute Care Choice:  Durable Medical Equipment, Home Health Choice offered to:  Patient  DME Arranged:  Walker rolling DME Agency:  [place]:  PT [facility] Agency:     Status of Service:  In process, will continue to follow  If discussed at Long Length of Stay Meetings, dates discussed:    Additional Comments:  Marilu Favre, RN 06/16/2018, 12:59 PM

## 2018-06-16 NOTE — Discharge Summary (Signed)
Physician Discharge Summary  Antonio Wise DXI:[id number] DOB: 06/19/1941 DOA: 06/09/2018  PCP: [name], PA-C  Admit date: 06/09/2018 Discharge date: 06/16/2018  Admitted From:home Disposition:home Recommendations for Outpatient Follow-up:  1. Follow up with PCP in 1-2 weeks 2. Please obtain BMP/CBC in one week 3. Follow-up with Dr. Benay Spice next week  Home Health: PT Equipment/Devices none  Discharge Condition stable CODE STATUS full code Diet recommendation: Cardiac Brief/Interim Summary:77 y.o.malewith medical history significant ofO2 dependent COPD, uses 2 L of oxygen 24 hours a day, chronic GI bleed, history of duodenal ulcer, gastric ulcer, diverticulitis, atrial fibrillation on Xarelto, hypertension, hyperlipidemia admitted with progressive worsening of shortness of breath for the last few days prior to admission to the hospital. Patient lives alone. He was found on the floor by the family after being on the floor for approximately 4 hours. He complains of generalized weakness and difficulty breathing upon arrival to the ER. He was placed on a CPAP and brought him to the hospital in the ER patient was placed on BiPAP. He denies fever, chills, cough, nausea vomiting diarrhea abdominal pain chest pain or palpitation. He denies urinary complaints. He does report decreased appetite he thinks he must have lost weight but no know how much. Reports decreased p.o. intake at home. Patient has been having chronic GI bleed with maroon stool, bright red bleeding per rectum on and off for almost a year now. He was supposed to have a colonoscopy by Dr. [name] with [facility]. However it has not happened it was always postponed due to some reason or the other. Discharge Diagnoses:  Active Problems:   COPD with acute exacerbation (HCC)   SOB (shortness of breath)   GI bleed   Leukocytosis   AKI (acute kidney injury) ([place])   Transaminitis   Iron deficiency anemia due to chronic  blood loss   Adenocarcinoma, colon ([place])  #1 newly diagnosed adenocarcinoma of the rectum patient presented with bleeding per rectum for few weeks to months.  Colonoscopy showed nonobstructive rectal mass status post biopsy which showed adenocarcinoma.  Patient seen by oncology Dr. Benay Spice.   CT of the chest without contrast ordered.  Patient will be discharged home today after the CT.  Patient will follow-up with Dr. Benay Spice next week.  MRI of the pelvis will be done as an outpatient for staging purposes.  #2 AKI resolved with hydration  #3Sepsis picture on admit cultures negative.antibiotics stopped.  #4Anemia due to chronic gi blood loss-his hemoglobin on admission was 4 he received blood transfusion.  Xarelto was stopped during his hospital stay.  We will continue to stop Xarelto since his bleeding is from the rectal cancer.  Pros and cons explained to the daughters.  #5HTN stable continue cardizem lopressor lasix  #6 S/P copd exacerbation stable on po steroids   #7 hallucinations-CT head not done per family request.     Malnutrition Type:  Nutrition Problem: Inadequate oral intake Etiology: altered GI function, decreased appetite   Malnutrition Characteristics:  Signs/Symptoms: meal completion < 50%, per patient/family report   Nutrition Interventions:  Interventions: Boost Breeze, Refer to RD note for recommendations, MVI  Estimated body mass index is 30.28 kg/m as calculated from the following:   Height as of this encounter: 6\' 1"  (1.854 m).   Weight as of this encounter: 104.1 kg.  Discharge Instructions  Discharge Instructions    Call MD for:  difficulty breathing, headache or visual disturbances   Complete by:  As directed  Call MD for:  difficulty breathing, headache or visual disturbances   Complete by:  As directed    Call MD for:  persistant dizziness or light-headedness   Complete by:  As directed    Call MD for:  persistant nausea and vomiting    Complete by:  As directed    Call MD for:  persistant nausea and vomiting   Complete by:  As directed    Call MD for:  severe uncontrolled pain   Complete by:  As directed    Diet - low sodium heart healthy   Complete by:  As directed    Diet - low sodium heart healthy   Complete by:  As directed    Increase activity slowly   Complete by:  As directed    Increase activity slowly   Complete by:  As directed      Allergies as of 06/16/2018      Reactions   Codeine Nausea And Vomiting, Other (See Comments)   Severe nausea      Medication List    STOP taking these medications   famotidine 20 MG tablet Commonly known as:  PEPCID   spironolactone 25 MG tablet Commonly known as:  ALDACTONE   XARELTO 20 MG Tabs tablet Generic drug:  rivaroxaban     TAKE these medications   acetaminophen 500 MG tablet Commonly known as:  TYLENOL Take 500 mg by mouth every 6 (six) hours as needed for moderate pain or headache.   albuterol 108 (90 Base) MCG/ACT inhaler Commonly known as:  PROAIR HFA 2 puffs every 4 hours as needed only  if your can't catch your breath What changed:    how much to take  how to take this  when to take this  additional instructions   atorvastatin 40 MG tablet Commonly known as:  LIPITOR TAKE 1 TABLET(40 MG) BY MOUTH DAILY AT 6 PM What changed:  See the new instructions.   Cholecalciferol 100 MCG (4000 UT) Caps Take 1 capsule (4,000 Units total) by mouth daily.   diltiazem 240 MG 24 hr capsule Commonly known as:  CARDIZEM CD TAKE 2 CAPSULES(480 MG) BY MOUTH DAILY What changed:  See the new instructions.   Fluticasone-Umeclidin-Vilant 100-62.5-25 MCG/INH Aepb Commonly known as:  TRELEGY ELLIPTA Inhale 1 puff into the lungs daily.   furosemide 20 MG tablet Commonly known as:  LASIX TAKE 2 TABLETS BY MOUTH DAILY   levalbuterol 0.63 MG/3ML nebulizer solution Commonly known as:  XOPENEX USE 1 VIAL VIA NEBULIZER EVERY 6 HOURS AS NEEDED FOR  WHEEZING OR SHORTNESS OF BREATH What changed:  See the new instructions.   metoprolol tartrate 50 MG tablet Commonly known as:  LOPRESSOR TAKE 1 TABLET BY MOUTH TWICE DAILY   multivitamin with minerals Tabs tablet Take 1 tablet by mouth daily. Start taking on:  June 17, 2018   OXYGEN Inhale 2 L into the lungs continuous.   predniSONE 10 MG tablet Commonly known as:  DELTASONE Take 3 tablets daily for 3 days then 2 tablets daily for 3 days then 1 tablet daily till done.      Follow-up Information    Leighton Ruff, MD. Go on 12/87/8676.   Specialty:  General Surgery Why:  Follow up appointment scheduled for 9:40 AM, arrive by 9:00 AM for check in and paperwork. Bring photo ID and insurance information with you.  Contact information: [place] [address] [phone]  Allergies  Allergen Reactions  . Codeine Nausea And Vomiting and Other (See Comments)    Severe nausea    Consultations:  Oncology, GI, general surgery   Procedures/Studies: Ct Abdomen Pelvis Wo Contrast  Result Date: 06/14/2018 CLINICAL DATA:  Diffuse abdominal pain.  History of duodenal ulcer. EXAM: CT ABDOMEN AND PELVIS WITHOUT CONTRAST TECHNIQUE: Multidetector CT imaging of the abdomen and pelvis was performed following the standard protocol without IV contrast. COMPARISON:  Noncontrast CT 10/22/2015. FINDINGS: Lower chest: New moderate size dependent bilateral pleural effusions with associated bibasilar atelectasis. There is coronary and aortic atherosclerosis. Mitral annular calcifications are noted. Hepatobiliary: No focal hepatic abnormalities are identified on noncontrast imaging. Several calcified gallstones are again noted. The gallbladder is incompletely distended with possible mild wall thickening, but no surrounding focal inflammation. There is no significant biliary dilatation. Pancreas: No pancreatic ductal dilatation, focal abnormality or focal  surrounding inflammation identified. Spleen: Normal in size without focal abnormality. Adrenals/Urinary Tract: Both adrenal glands appear normal. Stable cyst posteriorly in the interpolar region of the left kidney. No other focal renal abnormalities are identified. There is no evidence of urinary tract calculus or hydronephrosis. The bladder appears normal. Stomach/Bowel: No evidence of bowel wall thickening, distention or surrounding inflammatory change. The appendix appears normal. No colonic masses are identified. Vascular/Lymphatic: There are no enlarged abdominal or pelvic lymph nodes. There is moderate diffuse aortic and branch vessel atherosclerosis without acute vascular findings on noncontrast imaging. Reproductive: Stable mild enlargement of the prostate gland. The seminal vesicles appear normal. Other: Anasarca with diffuse soft tissue edema involving the intra-abdominal and subcutaneous fat. There is a small amount of fluid in both pericolic gutters and in the right lower quadrant. No focal extraluminal fluid or air collection identified. Edema surrounding the duodenum does not appear greater than the generalized edema. Musculoskeletal: No acute or significant osseous findings. Old fracture of the right 11th rib laterally with incomplete healing. IMPRESSION: 1. Generalized anasarca with diffuse soft tissue edema, moderate-sized bilateral pleural effusions and mild ascites. No focal fluid collections identified. 2. No evidence of bowel obstruction, perforation or focal surrounding inflammation. 3. Cholelithiasis. 4.  Aortic Atherosclerosis (ICD10-I70.0). Electronically Signed   By: Richardean Sale M.D.   On: 06/14/2018 16:17   Dg Chest 1 View  Result Date: 06/13/2018 CLINICAL DATA:  Syncopal episode EXAM: CHEST  1 VIEW COMPARISON:  06/09/2018, 10/08/2017 FINDINGS: Small bilateral pleural effusions. Hazy edema or infiltrates at the bases, increased compared to prior. Stable cardiomediastinal silhouette  with aortic atherosclerosis. No pneumothorax. Worsening airspace disease at both bases. IMPRESSION: Small bilateral pleural effusions with worsening bibasilar airspace disease which may be secondary to edema or pneumonia. Electronically Signed   By: Donavan Foil M.D.   On: 06/13/2018 20:14   Dg Abd 1 View  Result Date: 06/11/2018 CLINICAL DATA:  Constipation. EXAM: ABDOMEN - 1 VIEW COMPARISON:  CT scan 10/22/2015 FINDINGS: No gaseous bowel dilatation to suggest obstruction. No substantial stool volume in the colon. Calcified gallstones evident right upper quadrant. Visualized bony anatomy unremarkable. IMPRESSION: Nonspecific bowel gas pattern. No substantial stool volume in the colon. Electronically Signed   By: Misty Stanley M.D.   On: 06/11/2018 16:15   Dg Chest Port 1 View  Result Date: 06/09/2018 CLINICAL DATA:  Patient found down today.  History of COPD. EXAM: PORTABLE CHEST 1 VIEW COMPARISON:  PA and lateral chest 10/08/2017. FINDINGS: The lungs are emphysematous. Lung volumes are somewhat lower than on the comparison exams. Hazy airspace opacity is seen in  the right lung base. The left lung is clear. No pneumothorax or pleural effusion. Heart size is normal. Aortic atherosclerosis is noted. No acute bony abnormality. IMPRESSION: Hazy opacity in the right lung base may be due to atelectasis in this low volume chest but could also be secondary to pneumonia or aspiration. Emphysema. Atherosclerosis. Electronically Signed   By: Inge Rise M.D.   On: 06/09/2018 12:47    (Echo, Carotid, EGD, Colonoscopy, ERCP)    Subjective:   Discharge Exam: Vitals:   06/15/18 2052 06/16/18 0659  BP: 113/63 119/65  Pulse: 77 84  Resp: 18 18  Temp: 98.1 F (36.7 C) (!) 97.5 F (36.4 C)  SpO2: 97% 97%   Vitals:   06/14/18 2300 06/15/18 0532 06/15/18 2052 06/16/18 0659  BP:  117/81 113/63 119/65  Pulse:  81 77 84  Resp:  19 18 18   Temp: 97.7 F (36.5 C) 97.6 F (36.4 C) 98.1 F (36.7 C) (!)  97.5 F (36.4 C)  TempSrc: Oral Oral Oral Oral  SpO2:  92% 97% 97%  Weight:      Height:        General: Pt is alert, awake, not in acute distress Cardiovascular: RRR, S1/S2 +, no rubs, no gallops Respiratory: Few scattered wheezes bilaterally, no wheezing, no rhonchi Abdominal: Soft, NT, ND, bowel sounds + Extremities: no edema, no cyanosis    The results of significant diagnostics from this hospitalization (including imaging, microbiology, ancillary and laboratory) are listed below for reference.     Microbiology: Recent Results (from the past 240 hour(s))  Blood culture (routine x 2)     Status: None   Collection Time: 06/09/18  2:55 PM  Result Value Ref Range Status   Specimen Description BLOOD LEFT HAND  Final   Special Requests   Final    BOTTLES DRAWN AEROBIC AND ANAEROBIC Blood Culture adequate volume   Culture   Final    NO GROWTH 5 DAYS Performed at [facility], [address] [place], [place] [address]    Report Status 06/14/2018 FINAL  Final  Blood culture (routine x 2)     Status: None   Collection Time: 06/09/18  8:51 PM  Result Value Ref Range Status   Specimen Description BLOOD LEFT HAND  Final   Special Requests   Final    BOTTLES DRAWN AEROBIC ONLY Blood Culture adequate volume   Culture   Final    NO GROWTH 5 DAYS Performed at [facility], [place] [address] [place], [place] [address]    Report Status 06/14/2018 FINAL  Final  MRSA PCR Screening     Status: None   Collection Time: 06/09/18 10:11 PM  Result Value Ref Range Status   MRSA by PCR NEGATIVE NEGATIVE Final    Comment:        The GeneXpert MRSA Assay (FDA approved for NASAL specimens only), is one component of a comprehensive MRSA colonization surveillance program. It is not intended to diagnose MRSA infection nor to guide or monitor treatment for MRSA infections. Performed at [facility], [place] [address] [place], [place] [address]      Labs: BNP (last 3  results) Recent Labs    06/09/18 1253  BNP 030.0*   Basic Metabolic Panel: Recent Labs  Lab 06/11/18 0307 06/12/18 0321 06/13/18 0203 06/14/18 1452 06/15/18 0153  NA 137 138 140 137 137  K 4.0 3.7 3.5 4.2 4.4  CL 100 103 105 103 105  CO2 26 25 29 23 23   GLUCOSE 245* 295* 201* 255* 242*  BUN 26* 28* 26* 38* 37*  CREATININE 1.63* 1.76* 1.46* 1.58* 1.49*  CALCIUM 8.6* 8.2* 8.3* 8.4* 8.4*   Liver Function Tests: Recent Labs  Lab 06/11/18 0307 06/12/18 0321 06/13/18 0203 06/14/18 1452 06/15/18 0153  AST 70* 44* 43* 39 31  ALT 229* 175* 164* 160* 140*  ALKPHOS 76 67 58 56 51  BILITOT 1.5* 1.2 1.2 1.0 0.8  PROT 5.7* 5.4* 5.4* 5.5* 5.2*  ALBUMIN 3.3* 3.0* 3.1* 3.4* 3.2*   No results for input(s): LIPASE, AMYLASE in the last 168 hours. No results for input(s): AMMONIA in the last 168 hours. CBC: Recent Labs  Lab 06/09/18 1253 06/09/18 1522  06/11/18 0307 06/12/18 0321 06/13/18 0203 06/14/18 1452 06/15/18 0153  WBC 29.4* 19.0*   < > 18.1* 19.9* 18.3* 13.5* 9.8  NEUTROABS 25.0* 17.3*  --   --   --   --   --  8.6*  HGB 5.8* 4.7*   < > 9.3* 9.2* 9.2* 9.9* 9.5*  HCT 25.1* 20.4*   < > 33.0* 33.0* 33.1* 36.3* 36.1*  MCV 78.0* 76.4*   < > 77.3* 80.7 80.7 83.1 86.0  PLT 793* 613*   < > 397 396 391 338 362   < > = values in this interval not displayed.   Cardiac Enzymes: No results for input(s): CKTOTAL, CKMB, CKMBINDEX, TROPONINI in the last 168 hours. BNP: Invalid input(s): POCBNP CBG: No results for input(s): GLUCAP in the last 168 hours. D-Dimer No results for input(s): DDIMER in the last 72 hours. Hgb A1c No results for input(s): HGBA1C in the last 72 hours. Lipid Profile No results for input(s): CHOL, HDL, LDLCALC, TRIG, CHOLHDL, LDLDIRECT in the last 72 hours. Thyroid function studies No results for input(s): TSH, T4TOTAL, T3FREE, THYROIDAB in the last 72 hours.  Invalid input(s): FREET3 Anemia work up No results for input(s): VITAMINB12, FOLATE,  FERRITIN, TIBC, IRON, RETICCTPCT in the last 72 hours. Urinalysis    Component Value Date/Time   COLORURINE YELLOW 10/11/2015 1212   APPEARANCEUR CLEAR 10/11/2015 1212   LABSPEC 1.020 10/11/2015 1212   PHURINE 5.5 10/11/2015 1212   GLUCOSEU NEGATIVE 10/11/2015 1212   HGBUR NEGATIVE 10/11/2015 1212   BILIRUBINUR NEGATIVE 10/11/2015 1212   KETONESUR NEGATIVE 10/11/2015 1212   PROTEINUR TRACE (A) 10/11/2015 1212   NITRITE NEGATIVE 10/11/2015 1212   LEUKOCYTESUR NEGATIVE 10/11/2015 1212   Sepsis Labs Invalid input(s): PROCALCITONIN,  WBC,  LACTICIDVEN Microbiology Recent Results (from the past 240 hour(s))  Blood culture (routine x 2)     Status: None   Collection Time: 06/09/18  2:55 PM  Result Value Ref Range Status   Specimen Description BLOOD LEFT HAND  Final   Special Requests   Final    BOTTLES DRAWN AEROBIC AND ANAEROBIC Blood Culture adequate volume   Culture   Final    NO GROWTH 5 DAYS Performed at [facility], [place] [address] [place], [place] [address]    Report Status 06/14/2018 FINAL  Final  Blood culture (routine x 2)     Status: None   Collection Time: 06/09/18  8:51 PM  Result Value Ref Range Status   Specimen Description BLOOD LEFT HAND  Final   Special Requests   Final    BOTTLES DRAWN AEROBIC ONLY Blood Culture adequate volume   Culture   Final    NO GROWTH 5 DAYS Performed at [facility], [address] [place], [place] [address]    Report Status 06/14/2018 FINAL  Final  MRSA PCR Screening     Status: None   Collection Time: 06/09/18 10:11 PM  Result Value Ref Range Status   MRSA by PCR NEGATIVE NEGATIVE Final    Comment:        The GeneXpert MRSA Assay (FDA approved for NASAL specimens only), is one component of a comprehensive MRSA colonization surveillance program. It is not intended to diagnose MRSA infection nor to guide or monitor treatment for MRSA infections. Performed at [facility], [place] [address]  [place], [place] [address]      Time coordinating discharge: 34  minutes  SIGNED:   Georgette Shell, MD  [facility] 06/16/2018, 12:03 PM Pager   If 7PM-7AM, please contact night-coverage [email or web address] Password [email or web address]

## 2018-06-16 NOTE — Progress Notes (Addendum)
Patient discharged to home. AVS reviewed with both daughters and patient. They verbalize understanding of all discharge instructions including  discharge medications and follow up MD visits.   Patient refused home health but was provided with a rolling walker and 3 in 1.

## 2018-06-16 NOTE — Progress Notes (Signed)
  Oncology Nurse Navigator Documentation  Surgical eferral sent to [facility].

## 2018-06-16 NOTE — Care Management Note (Addendum)
Case Management Note  Patient Details  Name: Antonio Wise MRN: [id number] Date of Birth: 04/15/1941  Subjective/Objective:                    Action/Plan: Spoke to patient , and daughters [name] and [name]. They have portable oxygen tank with them and will transport patient home via private car.   They would like [facility] for PT. Awaiting call back from [facility] with [facility].   Patient's address is [address] [place], [place] [address]   Phone [phone]  Dan with [facility] returned call and is unable to accept referral due to staffing. Explained same to patient and both daughters. Asked for patient's second choice off list of home health agencies. Patient states he is fine and does not want home health at this time. Explained there are multiple other home health agencies NCM can call. Again patient states he is fine and does not want home health, he will call PCP if he changes his mind. Daughters in agreement.  Expected Discharge Date:  06/16/18               Expected Discharge Plan:  [place]  In-House Referral:     Discharge planning Services  CM Consult  Post Acute Care Choice:  Durable Medical Equipment, Home Health Choice offered to:  Patient  DME Arranged:  Walker rolling, 3 in 1  DME Agency:  [place]:  [facility] Agency:  Pt refused when [facility] unable to accept referral. Patient and daughters aware there are other agencies ( they have copy of [facility] list) , however they decline home health   Status of Service:  In process, will continue to follow  If discussed at Long Length of Stay Meetings, dates discussed:    Additional Comments:  Marilu Favre, RN 06/16/2018, 1:57 PM

## 2018-06-16 NOTE — Telephone Encounter (Signed)
New message    [name] is calling because pt just got discharged from the hospital and he was told to stop taking 3 of his medications. She has questions about this.

## 2018-06-16 NOTE — Telephone Encounter (Signed)
Spoke with daughter Antonio Wise who wanted to further review hospital discharge instructions on medications. There are 2 new meds - MVI and prednisone. Advised to take as directed. Reviewed meds to be stopped. No further assistance needed at this time.

## 2018-06-17 NOTE — Progress Notes (Signed)
  Oncology Nurse Navigator Documentation      Called and spoke with patient's daughter, [name], to introduce the role of GI navigator and to review upcoming surgical appointment with Dr. Marcello Moores and initial oncology apt with Dr. Benay Spice. I provided my contact information for questions or concerns.

## 2018-06-18 LAB — CEA: CEA: 20.3 ng/mL — ABNORMAL HIGH (ref 0.0–4.7)

## 2018-06-21 ENCOUNTER — Other Ambulatory Visit: Payer: Self-pay | Admitting: Oncology

## 2018-06-21 ENCOUNTER — Telehealth: Payer: Self-pay | Admitting: Oncology

## 2018-06-21 ENCOUNTER — Telehealth: Payer: Self-pay | Admitting: *Deleted

## 2018-06-21 ENCOUNTER — Other Ambulatory Visit: Payer: Self-pay | Admitting: *Deleted

## 2018-06-21 ENCOUNTER — Inpatient Hospital Stay: Payer: PPO | Attending: Oncology | Admitting: Oncology

## 2018-06-21 VITALS — BP 130/87 | HR 106 | Temp 98.0°F | Resp 19 | Ht 73.0 in

## 2018-06-21 DIAGNOSIS — N289 Disorder of kidney and ureter, unspecified: Secondary | ICD-10-CM | POA: Diagnosis not present

## 2018-06-21 DIAGNOSIS — I739 Peripheral vascular disease, unspecified: Secondary | ICD-10-CM | POA: Diagnosis not present

## 2018-06-21 DIAGNOSIS — J90 Pleural effusion, not elsewhere classified: Secondary | ICD-10-CM | POA: Diagnosis not present

## 2018-06-21 DIAGNOSIS — C19 Malignant neoplasm of rectosigmoid junction: Secondary | ICD-10-CM | POA: Diagnosis not present

## 2018-06-21 DIAGNOSIS — D500 Iron deficiency anemia secondary to blood loss (chronic): Secondary | ICD-10-CM | POA: Insufficient documentation

## 2018-06-21 DIAGNOSIS — I4891 Unspecified atrial fibrillation: Secondary | ICD-10-CM | POA: Diagnosis not present

## 2018-06-21 DIAGNOSIS — C20 Malignant neoplasm of rectum: Secondary | ICD-10-CM

## 2018-06-21 DIAGNOSIS — J449 Chronic obstructive pulmonary disease, unspecified: Secondary | ICD-10-CM | POA: Diagnosis not present

## 2018-06-21 NOTE — Telephone Encounter (Signed)
Left VM for patient to delay his visit today at 12:00 to 12/20 at 0800 since the MRI of his pelvis was not done before he left the hospital. MD needs this result to complete treatment planning/staging. Requested he return call to confirm he is willing to reschedule to Friday. MD re-entered MRI order and email sent to managed care for authorization.

## 2018-06-21 NOTE — Telephone Encounter (Signed)
Call from [place] that they are working on obtaining information on the vascular stents he had placed in 2006 to ensure he can have the MRI exam on Thursday. Daughter called again to confirm MRI time and f/u with Dr. Benay Spice.

## 2018-06-21 NOTE — Progress Notes (Signed)
  [facility] OFFICE PROGRESS NOTE   Diagnosis: Colorectal cancer  INTERVAL HISTORY:   Mr. 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  He was discharged from the hospital 06/16/2018.  He reports feeling better compared to hospital admission, but continues to feel weak.  No further bleeding.  He saw Dr. Marcello Moores earlier today.  Objective:  Vital signs in last 24 hours:  Blood pressure 130/87, pulse (!) 106, temperature 98 F (36.7 C), temperature source Oral, resp. rate 19, height 6\' 1"  (1.854 m), SpO2 90 %.    HEENT: Neck without mass Lymphatics: No cervical, supraclavicular, axillary, or inguinal nodes Resp: Lungs with distant breath sounds, inspiratory rhonchi at the left posterior chest, no respiratory distress Cardio: Irregular GI: No hepatosplenomegaly, nontender Vascular: Trace pitting edema at the lower leg bilaterally    Lab Results:  Lab Results  Component Value Date   WBC 9.8 06/15/2018   HGB 9.5 (L) 06/15/2018   HCT 36.1 (L) 06/15/2018   MCV 86.0 06/15/2018   PLT 362 06/15/2018   NEUTROABS 8.6 (H) 06/15/2018    CMP  Lab Results  Component Value Date   NA 137 06/15/2018   K 4.4 06/15/2018   CL 105 06/15/2018   CO2 23 06/15/2018   GLUCOSE 242 (H) 06/15/2018   BUN 37 (H) 06/15/2018   CREATININE 1.49 (H) 06/15/2018   CALCIUM 8.4 (L) 06/15/2018   PROT 5.2 (L) 06/15/2018   ALBUMIN 3.2 (L) 06/15/2018   AST 31 06/15/2018   ALT 140 (H) 06/15/2018   ALKPHOS 51 06/15/2018   BILITOT 0.8 06/15/2018   GFRNONAA 45 (L) 06/15/2018   GFRAA 52 (L) 06/15/2018    Lab Results  Component Value Date   CEA1 20.3 (H) 06/14/2018    Lab Results  Component Value Date   INR 2.78 06/11/2018     Medications: I have reviewed the patient's current medications.   Assessment/Plan: 1. Rectal cancer ? Proximal rectal mass noted on colonoscopy 06/14/2018, biopsy revealed  adenocarcinoma ? 06/14/2018 -CT abdomen/pelvis-no contrast bilateral pleural effusions, mild ascites, no evidence of metastatic disease ? CT chest 06/16/2018-no evidence of metastatic disease, bilateral pleural effusions with compressive atelectasis, small to moderate ascites, emphysema, peribronchovascular nodularity in the left lower lobe consistent with infection  2. Multiple colon polyps on colonoscopy 06/14/2018-not biopsied or removed 3. Iron deficiency anemia secondary to #1 4. COPD 5. History of gastric and duodenal ulcers 6. Atrial fibrillation 7. Peripheral vascular disease 8. Renal insufficiency    Disposition: Mr. Choyce has been diagnosed with adenocarcinoma of the proximal rectum versus rectosigmoid.  There is no clinical evidence of metastatic disease.  I suspect the pleural effusions noted on the chest CT 06/16/2018 are related to cardiac disease and volume he received while hospitalized. It is unclear whether the primary tumor is in the high rectum or low sigmoid.  He will be scheduled for a pelvic MRI within the next few days.  The plan is to refer him to Dr. Marcello Moores for surgery if the tumor is in the colon or if he has early stage rectal cancer.  We will plan neoadjuvant chemotherapy/radiation if he appears to have advanced stage rectal cancer.  Mr. Senger will return for an office visit on 06/24/2018.  30 minutes were spent with the patient today.  The majority of the time was used for counseling and coordination of care.  Betsy Coder, MD  06/21/2018  12:41 PM

## 2018-06-21 NOTE — Telephone Encounter (Signed)
MRI Pelvis first available at [facility] was 12/21, which was not acceptable to MD. [facility] and was able to schedule for 06/23/18 at 0830/0850. Called daughter with appointment and instructed them to bring his oxygen with him as well. F/U with Dr. Benay Spice on 12/20 as scheduled. Called managed care to confirm authorization is still valid for new location or for new authorization to be started.

## 2018-06-21 NOTE — Telephone Encounter (Signed)
Printed calendar and avs. °

## 2018-06-22 ENCOUNTER — Telehealth: Payer: Self-pay | Admitting: Family Medicine

## 2018-06-22 ENCOUNTER — Telehealth: Payer: Self-pay | Admitting: Oncology

## 2018-06-22 DIAGNOSIS — R4020 Unspecified coma: Secondary | ICD-10-CM | POA: Diagnosis not present

## 2018-06-22 DIAGNOSIS — R000 Tachycardia, unspecified: Secondary | ICD-10-CM | POA: Diagnosis not present

## 2018-06-22 DIAGNOSIS — R404 Transient alteration of awareness: Secondary | ICD-10-CM | POA: Diagnosis not present

## 2018-06-22 DIAGNOSIS — I499 Cardiac arrhythmia, unspecified: Secondary | ICD-10-CM | POA: Diagnosis not present

## 2018-06-22 NOTE — Telephone Encounter (Signed)
Received call from EMS, pt found unresponsive in his home at 6:30 this AM, CPR done, pronounced dead at 6:59AM Known rectal cancer Multiple chronic illness Last seen in his general state of health last night

## 2018-06-22 NOTE — Telephone Encounter (Signed)
Per 12/17 sch message - cancelled appt - per daughter - pt deceased.

## 2018-06-23 ENCOUNTER — Other Ambulatory Visit: Payer: PPO

## 2018-06-24 ENCOUNTER — Ambulatory Visit: Payer: PPO | Admitting: Oncology

## 2018-06-24 ENCOUNTER — Other Ambulatory Visit: Payer: PPO

## 2018-06-25 ENCOUNTER — Ambulatory Visit (HOSPITAL_COMMUNITY): Payer: PPO

## 2018-07-06 DIAGNOSIS — 419620001 Death: Secondary | SNOMED CT | POA: Diagnosis not present

## 2018-07-06 DEATH — deceased

## 2018-07-28 ENCOUNTER — Ambulatory Visit: Payer: PPO | Admitting: Internal Medicine

## 2018-08-15 ENCOUNTER — Ambulatory Visit: Payer: PPO | Admitting: Physician Assistant

## 2018-11-18 ENCOUNTER — Telehealth: Payer: Self-pay | Admitting: *Deleted

## 2018-11-18 NOTE — Telephone Encounter (Signed)
MR faxed to [facility]; [place] [phone]

## 2019-11-15 IMAGING — CT CT CHEST W/O CM
2 of 3 series · 15 of 36 positions shown, 18 images · non-contrast
Comparison: CT abdomen pelvis 06/14/2018 and CT chest 03/11/2007.

CLINICAL DATA: Colon cancer, resected metastases.  Post treatment.

EXAM:
CT CHEST WITHOUT CONTRAST
TECHNIQUE: Multidetector CT imaging of the chest was performed following the
standard protocol without IV contrast.

[Series 3: chest w/o 2mm st · axial · non-contrast · 0.98mm/px · z∈[+1026,+1348]mm · 12 of 191 slices shown, 15 images]
[im 15/191  mediastinal]
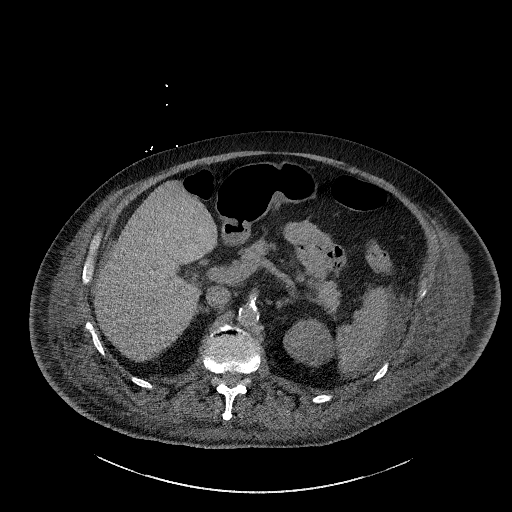
[im 15/191  lung]
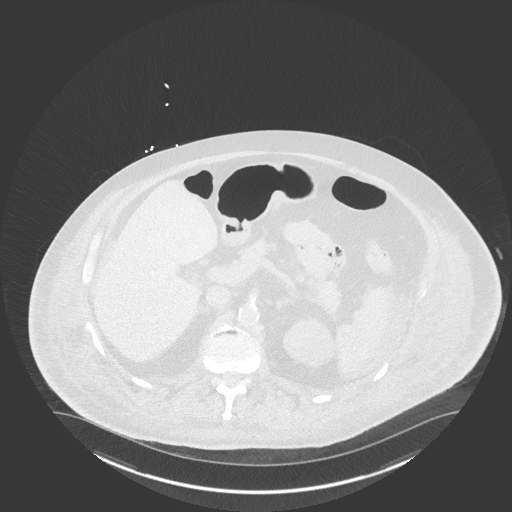
[im 29/191  lung]
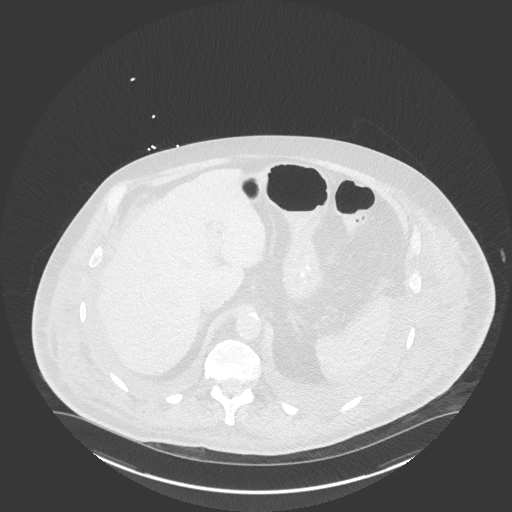
[im 43/191  lung]
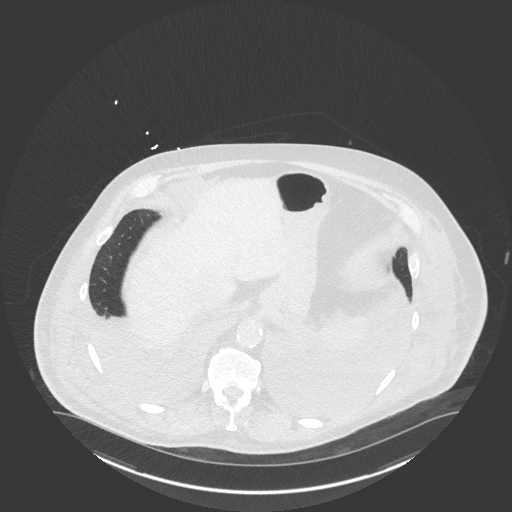
[im 57/191  lung]
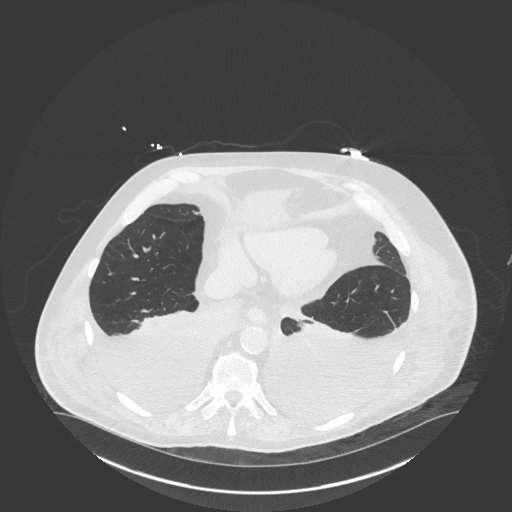
[im 71/191  mediastinal]
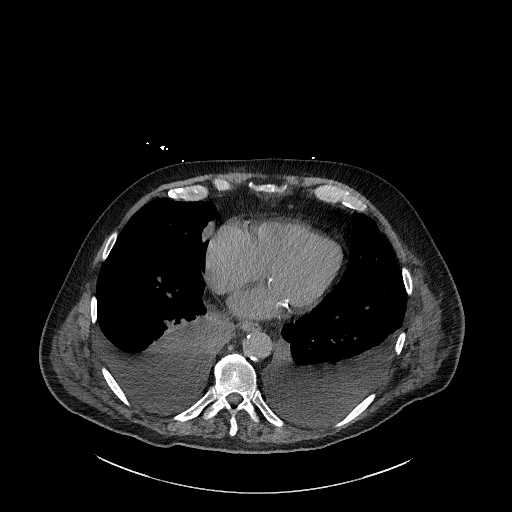
[im 71/191  lung]
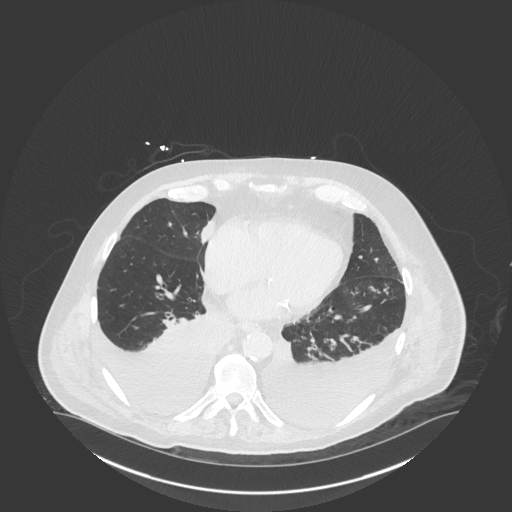
[im 85/191  lung]
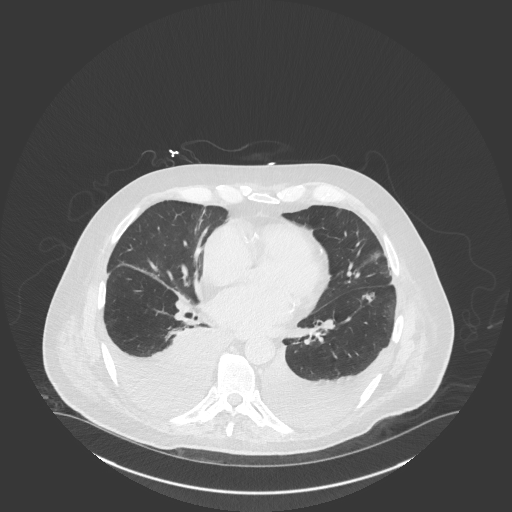
[im 106/191  lung]
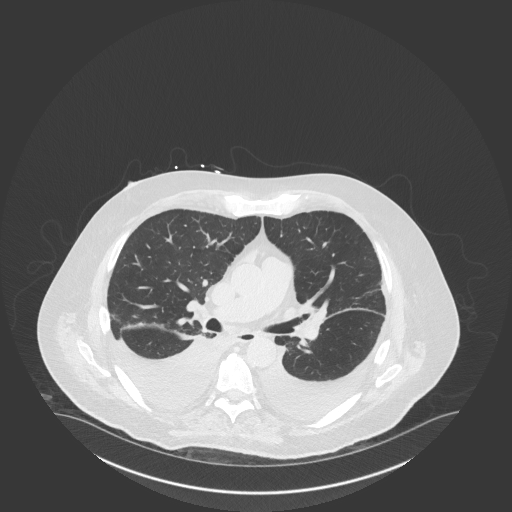
[im 120/191  lung]
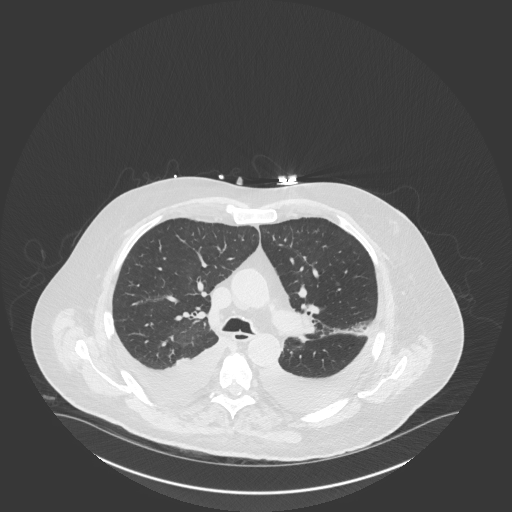
[im 134/191  mediastinal]
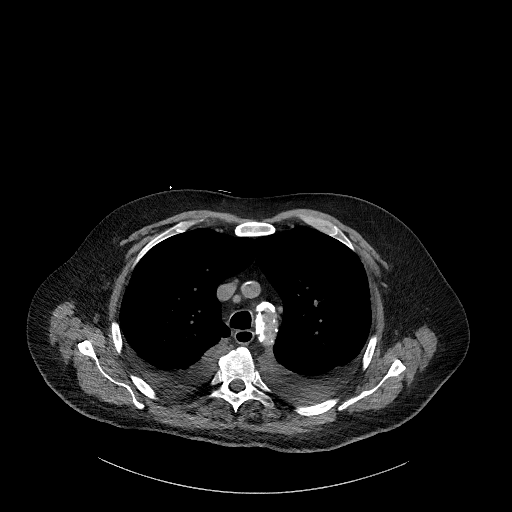
[im 134/191  lung]
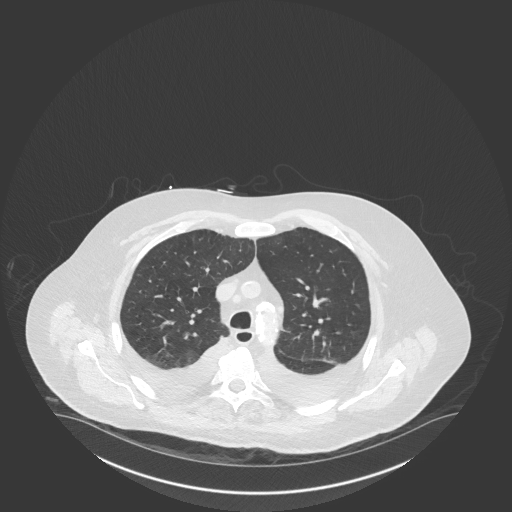
[im 148/191  lung]
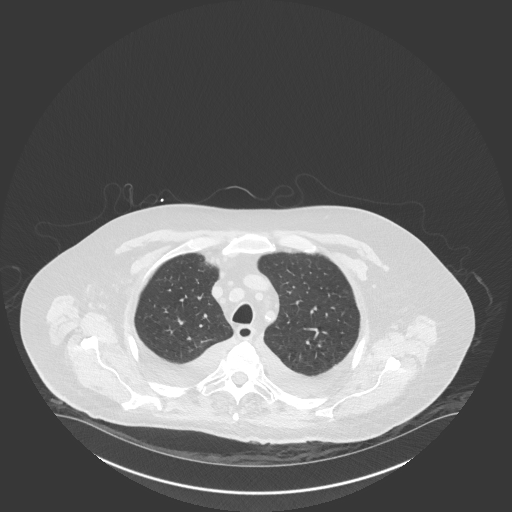
[im 162/191  lung]
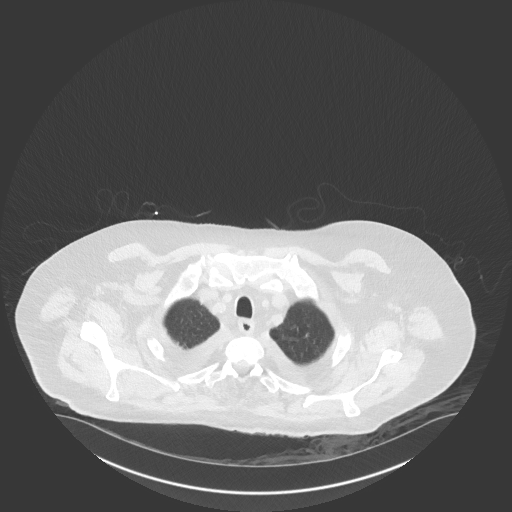
[im 176/191  lung]
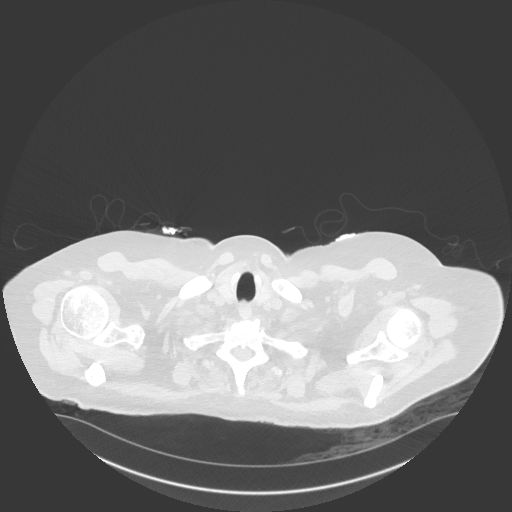

[Series 5: chest w/o 3mm st cor · coronal · non-contrast · 0.76mm/px · 3 of 101 slices shown]
[im 21/101  lung]
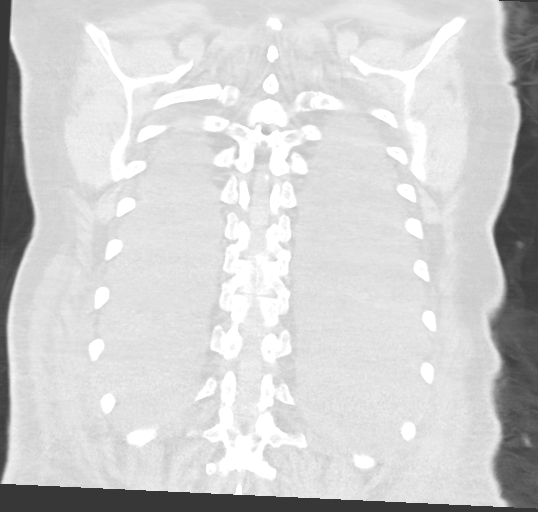
[im 41/101  lung]
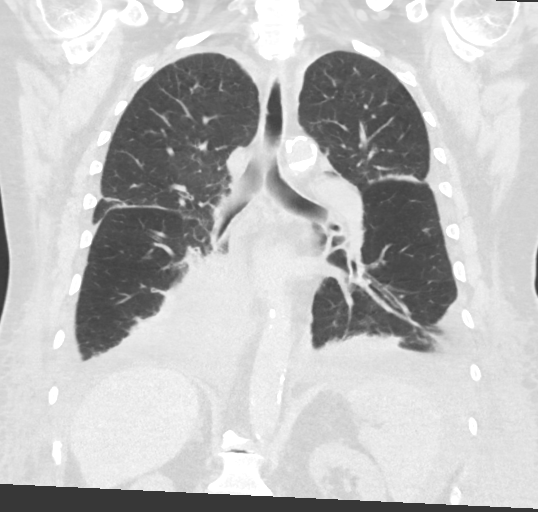
[im 61/101  lung]
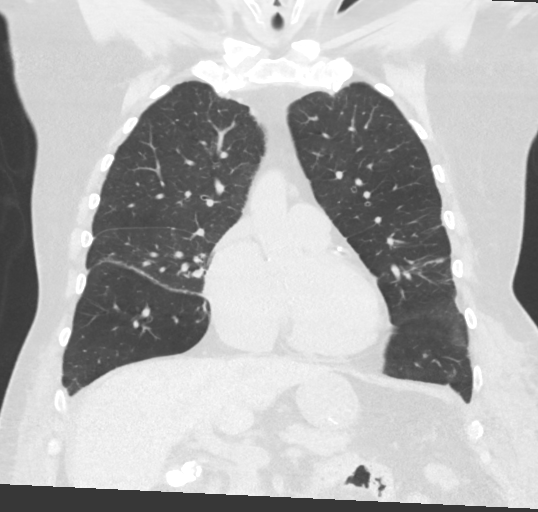

[15 of 36 positions shown; findings below may reference images not displayed]

FINDINGS: Cardiovascular: Atherosclerotic calcification of the aorta, aortic
valve and coronary arteries. Heart size within normal limits. No
pericardial effusion.

Mediastinum/Nodes: Mediastinal lymph nodes are not enlarged by CT
size criteria. Hilar regions are difficult to evaluate without IV
contrast. No axillary adenopathy. Esophagus is grossly unremarkable.

Lungs/Pleura: Centrilobular emphysema. Moderate to large bilateral
pleural effusions with compressive atelectasis in both lower lobes.
Mild volume loss in the posterior aspects of both upper lobes.
Peribronchovascular nodularity in the anterior left lower lobe.
Debris is seen dependently in the trachea. Airway is otherwise
unremarkable.

Upper Abdomen: Visualized portion of the liver is unremarkable.
Stones fill the gallbladder. Adrenal glands and right kidney are
unremarkable. Low-attenuation lesion in the left kidney measures
cm, incompletely imaged. Visualized portions of the spleen,
pancreas, stomach and bowel are grossly unremarkable. Small to
moderate ascites. No upper abdominal adenopathy.

Musculoskeletal: Degenerative changes in the spine. No worrisome
lytic or sclerotic lesions.
IMPRESSION: 1. No evidence of metastatic disease.
2. Peribronchovascular nodularity in the left lower lobe is strongly
favored to be infectious in etiology.
3. Moderate to large bilateral pleural effusions with compressive
atelectasis in both lower lobes.
4. Small to moderate ascites.
5. Cholelithiasis.
6. Aortic atherosclerosis (E4ZWU-170.0). Coronary artery
calcification.
7.  Emphysema ([id number].O).

## 2020-12-18 ENCOUNTER — Other Ambulatory Visit (HOSPITAL_COMMUNITY): Payer: Self-pay
